# Patient Record
Sex: Female | Born: 1945
Health system: Southern US, Community
[De-identification: ages and names within clinical notes are randomized; demographics above are authoritative.]

## PROBLEM LIST (undated history)

## (undated) DIAGNOSIS — M1990 Unspecified osteoarthritis, unspecified site: Secondary | ICD-10-CM

## (undated) DIAGNOSIS — M5430 Sciatica, unspecified side: Secondary | ICD-10-CM

## (undated) DIAGNOSIS — I10 Essential (primary) hypertension: Secondary | ICD-10-CM

## (undated) DIAGNOSIS — E119 Type 2 diabetes mellitus without complications: Secondary | ICD-10-CM

## (undated) DIAGNOSIS — Z9889 Other specified postprocedural states: Secondary | ICD-10-CM

## (undated) DIAGNOSIS — Z8619 Personal history of other infectious and parasitic diseases: Secondary | ICD-10-CM

## (undated) DIAGNOSIS — I4819 Other persistent atrial fibrillation: Secondary | ICD-10-CM

## (undated) DIAGNOSIS — Z6841 Body Mass Index (BMI) 40.0 and over, adult: Secondary | ICD-10-CM

## (undated) DIAGNOSIS — E785 Hyperlipidemia, unspecified: Secondary | ICD-10-CM

## (undated) DIAGNOSIS — I499 Cardiac arrhythmia, unspecified: Secondary | ICD-10-CM

## (undated) DIAGNOSIS — G4730 Sleep apnea, unspecified: Secondary | ICD-10-CM

## (undated) DIAGNOSIS — R112 Nausea with vomiting, unspecified: Secondary | ICD-10-CM

## (undated) HISTORY — DX: Hyperlipidemia, unspecified: E78.5

## (undated) HISTORY — PX: BREAST EXCISIONAL BIOPSY: SUR124

## (undated) HISTORY — DX: Body Mass Index (BMI) 40.0 and over, adult: Z684

## (undated) HISTORY — DX: Essential (primary) hypertension: I10

## (undated) HISTORY — PX: DG  BONE DENSITY (ARMC HX): HXRAD1102

## (undated) HISTORY — PX: ABDOMINAL HYSTERECTOMY: SHX81

## (undated) HISTORY — DX: Type 2 diabetes mellitus without complications: E11.9

## (undated) HISTORY — DX: Other persistent atrial fibrillation: I48.19

## (undated) HISTORY — PX: INNER EAR SURGERY: SHX679

## (undated) HISTORY — DX: Personal history of other infectious and parasitic diseases: Z86.19

## (undated) HISTORY — PX: NASAL SEPTUM SURGERY: SHX37

## (undated) HISTORY — DX: Sciatica, unspecified side: M54.30

---

## 2003-09-12 ENCOUNTER — Inpatient Hospital Stay (HOSPITAL_BASED_OUTPATIENT_CLINIC_OR_DEPARTMENT_OTHER): Admission: RE | Admit: 2003-09-12 | Discharge: 2003-09-12 | Payer: Self-pay | Admitting: Cardiology

## 2008-01-06 ENCOUNTER — Encounter: Admission: RE | Admit: 2008-01-06 | Discharge: 2008-01-06 | Payer: Self-pay | Admitting: Orthopedic Surgery

## 2008-02-07 ENCOUNTER — Ambulatory Visit (HOSPITAL_BASED_OUTPATIENT_CLINIC_OR_DEPARTMENT_OTHER): Admission: RE | Admit: 2008-02-07 | Discharge: 2008-02-07 | Payer: Self-pay | Admitting: Orthopedic Surgery

## 2010-08-11 NOTE — Op Note (Signed)
NAME:  Dominique Bauer, Dominique Bauer             ACCOUNT NO.:  [id number]   MEDICAL RECORD NO.:  [id number]          PATIENT TYPE:  AMB   LOCATION:  [facility]                         FACILITY:  [facility]   PHYSICIAN:  Ollen Gross, M.D.    DATE OF BIRTH:  October 14, 1945   DATE OF PROCEDURE:  02/07/2008  DATE OF DISCHARGE:                               OPERATIVE REPORT   PREOPERATIVE DIAGNOSIS:  Right knee medial meniscal tear.   POSTOPERATIVE DIAGNOSES:  Right knee medial meniscal tear plus lateral  meniscal tear, plus chondral defect, medial femoral condyle.   PROCEDURE:  Right knee arthroscopy with meniscal debridement medial and  lateral and chondroplasty, medial femoral condyle.   SURGEON:  Ollen Gross, M.D.   ASSISTANT:  None.   ANESTHESIA:  General.   ESTIMATED BLOOD LOSS:  Minimal.   DRAINS:  None.   COMPLICATIONS:  None.   CONDITION:  Stable to recovery.   BRIEF CLINICAL NOTE:  Dominique Bauer is a 65 year old female with a long  history of significantly worsening right knee pain and mechanical  symptoms.  She does have some patellofemoral arthritis, but her symptoms  were mostly medial.  Exam and history suggested a tear confirmed by MRI.  She presents now for arthroscopy and debridement.   PROCEDURE IN DETAIL:  After successful administration of general  anesthetic a tourniquet was placed high on the right thigh and right  lower extremity prepped and draped in usual sterile fashion.  Standard  superomedial and inferolateral incisions were made.  The inflow cannula  passed superomedial and camera passed inferolateral.  Arthroscopic  visualization proceeds.  She had some bone-on-bone change on the  undersurface of the patella and in the trochlea mainly lateral, but also  somewhat central.  She did not have any unstable appearing cartilage in  the trochlea or patella.  The medial and lateral gutters were  visualized.  There were no loose bodies.  Flexion valgus force was  applied to the knee  and the medial compartment was centered.  She had  some loose bodies in the medial compartment and about a 1 x 1 cm  chondral defect down to bone.  In addition, there was a significant tear  in the body and posterior horn of the medial meniscus.  No other  degenerative changes were noted in the medial compartment.  A spinal  needle was used to localize the inferomedial portal.  A small incision  made, dilator placed and we then we debrided the meniscus back to a  stable base with baskets and a 4.2 mm shaver and then sealed it off with  the ArthroCare device.  I debrided the chondral defect down to a stable  bony base with stable cartilaginous edges.  I probed and it was stable.  It is about a 1 x 1.5 cm lesion.  The rest of the medial femoral condyle  was stable and looked normal.  The intercondylar notch was then  visualized.  The ACL was normal.  Lateral compartment centered and there  was a significant degenerative tear in the body anterior and posterior  horn of the lateral meniscus.  We debrided this back to a stable base  with baskets, the 4.2 mm shaver and sealed it off with the ArthroCare  device.  The joint was again inspected.  There are no other tears, loose  bodies or chondral defects noted.  I removed the arthroscopic equipment  from the inferior portals which were closed with interrupted 4-0 nylon.  The 20 mL of 0.25% Marcaine with epi was injected through the inflow  cannula and that was removed and that portal closed with nylon.  A bulky  sterile dressing was then applied and she was awakened and transported  to recovery in stable condition.      Ollen Gross, M.D.  Electronically Signed     FA/MEDQ  D:  02/07/2008  T:  02/08/2008  Job:  [id number]

## 2010-08-14 NOTE — Cardiovascular Report (Signed)
NAME:  Dominique Bauer, Dominique Bauer                       ACCOUNT NO.:  [id number]   MEDICAL RECORD NO.:  [id number]                   PATIENT TYPE:  OIB   LOCATION:  6501                                 FACILITY:  [facility]   PHYSICIAN:  Charlton Haws, M.D.                  DATE OF BIRTH:  06-19-1945   DATE OF PROCEDURE:  09/12/2003  DATE OF DISCHARGE:  09/12/2003                              CARDIAC CATHETERIZATION   INDICATION:  Recurrent chest pain.   PROCEDURE:  Standard catheterization was done with 5 French catheters from  the right femoral artery.  JL-4 and JR-4 catheters were used to engage the  coronary ostia.   The left main coronary artery was normal.   The left anterior descending artery is normal.   Circumflex coronary artery is normal.   Right coronary artery was dominant and it was normal.   RAO VENTRICULOGRAPHY:  RAO ventriculography showed normal wall motion.  There was an EF of 65%.  There was no MR.  No gradient across the aortic  valve.   PRESSURES:  LV pressure is 145/80, aortic pressure was 145/80.   IMPRESSION:  The patient has no significant coronary artery disease.  Her  chest pain would appear to be noncardiac in etiology.  She will follow up  with primary care M.D.  She will be allowed to return to work at the [employer] at [place] on Monday June 20.  She tolerated the procedure well and had  no apparent complications.                                               Charlton Haws, M.D.    PN/MEDQ  D:  09/12/2003  T:  09/12/2003  Job:  [id number]   cc:   [facility]  [address]  [place]  [place] [address]  Fax: [phone]

## 2010-12-29 LAB — CBC
HCT: 39.6
Hemoglobin: 13.1
MCHC: 33
MCV: 89.8
Platelets: 219
RBC: 4.41
RDW: 14.1

## 2010-12-29 LAB — URINALYSIS, ROUTINE W REFLEX MICROSCOPIC
Glucose, UA: NEGATIVE
Ketones, ur: NEGATIVE

## 2010-12-29 LAB — PROTIME-INR: INR: 0.9

## 2010-12-29 LAB — URINE MICROSCOPIC-ADD ON

## 2010-12-29 LAB — BASIC METABOLIC PANEL
BUN: 19
CO2: 28
Calcium: 9.3
Creatinine, Ser: 0.68
GFR calc Af Amer: >60
GFR calc non Af Amer: >60
Glucose, Bld: 111 — ABNORMAL HIGH
Potassium: 4

## 2012-01-25 ENCOUNTER — Other Ambulatory Visit: Payer: Self-pay | Admitting: Internal Medicine

## 2012-01-25 DIAGNOSIS — R921 Mammographic calcification found on diagnostic imaging of breast: Secondary | ICD-10-CM

## 2012-02-01 ENCOUNTER — Ambulatory Visit
Admission: RE | Admit: 2012-02-01 | Discharge: 2012-02-01 | Disposition: A | Discharge status: Home / Self Care | Payer: Medicare Other | Source: Ambulatory Visit | Attending: Internal Medicine | Admitting: Internal Medicine

## 2012-02-01 DIAGNOSIS — R921 Mammographic calcification found on diagnostic imaging of breast: Secondary | ICD-10-CM

## 2012-02-01 HISTORY — PX: BREAST BIOPSY: SHX20

## 2013-01-11 ENCOUNTER — Other Ambulatory Visit: Payer: Self-pay

## 2013-01-11 DIAGNOSIS — Z1231 Encounter for screening mammogram for malignant neoplasm of breast: Secondary | ICD-10-CM

## 2013-02-09 ENCOUNTER — Ambulatory Visit
Admission: RE | Admit: 2013-02-09 | Discharge: 2013-02-09 | Disposition: A | Discharge status: Home / Self Care | Payer: Medicare Other | Source: Ambulatory Visit

## 2013-02-09 DIAGNOSIS — Z1231 Encounter for screening mammogram for malignant neoplasm of breast: Secondary | ICD-10-CM

## 2014-04-30 ENCOUNTER — Other Ambulatory Visit: Payer: Self-pay

## 2014-04-30 DIAGNOSIS — Z1231 Encounter for screening mammogram for malignant neoplasm of breast: Secondary | ICD-10-CM

## 2014-05-06 ENCOUNTER — Ambulatory Visit: Payer: Self-pay

## 2014-05-28 ENCOUNTER — Ambulatory Visit
Admission: RE | Admit: 2014-05-28 | Discharge: 2014-05-28 | Disposition: A | Discharge status: Home / Self Care | Payer: Medicare Other | Source: Ambulatory Visit

## 2014-05-28 DIAGNOSIS — Z1231 Encounter for screening mammogram for malignant neoplasm of breast: Secondary | ICD-10-CM

## 2015-03-31 DIAGNOSIS — M5430 Sciatica, unspecified side: Secondary | ICD-10-CM | POA: Diagnosis not present

## 2015-03-31 DIAGNOSIS — M9902 Segmental and somatic dysfunction of thoracic region: Secondary | ICD-10-CM | POA: Diagnosis not present

## 2015-03-31 DIAGNOSIS — I973 Postprocedural hypertension: Secondary | ICD-10-CM | POA: Diagnosis not present

## 2015-03-31 DIAGNOSIS — M9903 Segmental and somatic dysfunction of lumbar region: Secondary | ICD-10-CM | POA: Diagnosis not present

## 2015-03-31 DIAGNOSIS — M9904 Segmental and somatic dysfunction of sacral region: Secondary | ICD-10-CM | POA: Diagnosis not present

## 2015-04-03 DIAGNOSIS — M5430 Sciatica, unspecified side: Secondary | ICD-10-CM | POA: Diagnosis not present

## 2015-04-03 DIAGNOSIS — I973 Postprocedural hypertension: Secondary | ICD-10-CM | POA: Diagnosis not present

## 2015-04-03 DIAGNOSIS — M9903 Segmental and somatic dysfunction of lumbar region: Secondary | ICD-10-CM | POA: Diagnosis not present

## 2015-04-03 DIAGNOSIS — M9904 Segmental and somatic dysfunction of sacral region: Secondary | ICD-10-CM | POA: Diagnosis not present

## 2015-04-03 DIAGNOSIS — M9902 Segmental and somatic dysfunction of thoracic region: Secondary | ICD-10-CM | POA: Diagnosis not present

## 2015-04-07 DIAGNOSIS — I10 Essential (primary) hypertension: Secondary | ICD-10-CM | POA: Diagnosis not present

## 2015-04-07 DIAGNOSIS — J019 Acute sinusitis, unspecified: Secondary | ICD-10-CM | POA: Diagnosis not present

## 2015-04-21 DIAGNOSIS — M5430 Sciatica, unspecified side: Secondary | ICD-10-CM | POA: Diagnosis not present

## 2015-04-21 DIAGNOSIS — M9904 Segmental and somatic dysfunction of sacral region: Secondary | ICD-10-CM | POA: Diagnosis not present

## 2015-04-21 DIAGNOSIS — M9903 Segmental and somatic dysfunction of lumbar region: Secondary | ICD-10-CM | POA: Diagnosis not present

## 2015-04-21 DIAGNOSIS — M9902 Segmental and somatic dysfunction of thoracic region: Secondary | ICD-10-CM | POA: Diagnosis not present

## 2015-04-21 DIAGNOSIS — I973 Postprocedural hypertension: Secondary | ICD-10-CM | POA: Diagnosis not present

## 2015-04-23 DIAGNOSIS — M9904 Segmental and somatic dysfunction of sacral region: Secondary | ICD-10-CM | POA: Diagnosis not present

## 2015-04-23 DIAGNOSIS — M9903 Segmental and somatic dysfunction of lumbar region: Secondary | ICD-10-CM | POA: Diagnosis not present

## 2015-04-23 DIAGNOSIS — I973 Postprocedural hypertension: Secondary | ICD-10-CM | POA: Diagnosis not present

## 2015-04-23 DIAGNOSIS — M9902 Segmental and somatic dysfunction of thoracic region: Secondary | ICD-10-CM | POA: Diagnosis not present

## 2015-04-23 DIAGNOSIS — M5430 Sciatica, unspecified side: Secondary | ICD-10-CM | POA: Diagnosis not present

## 2015-04-24 DIAGNOSIS — M9902 Segmental and somatic dysfunction of thoracic region: Secondary | ICD-10-CM | POA: Diagnosis not present

## 2015-04-24 DIAGNOSIS — M5430 Sciatica, unspecified side: Secondary | ICD-10-CM | POA: Diagnosis not present

## 2015-04-24 DIAGNOSIS — M9903 Segmental and somatic dysfunction of lumbar region: Secondary | ICD-10-CM | POA: Diagnosis not present

## 2015-04-24 DIAGNOSIS — M9904 Segmental and somatic dysfunction of sacral region: Secondary | ICD-10-CM | POA: Diagnosis not present

## 2015-04-24 DIAGNOSIS — I973 Postprocedural hypertension: Secondary | ICD-10-CM | POA: Diagnosis not present

## 2015-04-28 DIAGNOSIS — M5430 Sciatica, unspecified side: Secondary | ICD-10-CM | POA: Diagnosis not present

## 2015-04-28 DIAGNOSIS — M9903 Segmental and somatic dysfunction of lumbar region: Secondary | ICD-10-CM | POA: Diagnosis not present

## 2015-04-28 DIAGNOSIS — I471 Supraventricular tachycardia: Secondary | ICD-10-CM | POA: Diagnosis not present

## 2015-04-28 DIAGNOSIS — M9902 Segmental and somatic dysfunction of thoracic region: Secondary | ICD-10-CM | POA: Diagnosis not present

## 2015-04-28 DIAGNOSIS — I10 Essential (primary) hypertension: Secondary | ICD-10-CM | POA: Diagnosis not present

## 2015-04-28 DIAGNOSIS — I973 Postprocedural hypertension: Secondary | ICD-10-CM | POA: Diagnosis not present

## 2015-04-28 DIAGNOSIS — Z1389 Encounter for screening for other disorder: Secondary | ICD-10-CM | POA: Diagnosis not present

## 2015-04-28 DIAGNOSIS — M9904 Segmental and somatic dysfunction of sacral region: Secondary | ICD-10-CM | POA: Diagnosis not present

## 2015-04-28 DIAGNOSIS — Z0000 Encounter for general adult medical examination without abnormal findings: Secondary | ICD-10-CM | POA: Diagnosis not present

## 2015-04-30 DIAGNOSIS — M9904 Segmental and somatic dysfunction of sacral region: Secondary | ICD-10-CM | POA: Diagnosis not present

## 2015-04-30 DIAGNOSIS — M9903 Segmental and somatic dysfunction of lumbar region: Secondary | ICD-10-CM | POA: Diagnosis not present

## 2015-04-30 DIAGNOSIS — M5430 Sciatica, unspecified side: Secondary | ICD-10-CM | POA: Diagnosis not present

## 2015-04-30 DIAGNOSIS — I973 Postprocedural hypertension: Secondary | ICD-10-CM | POA: Diagnosis not present

## 2015-04-30 DIAGNOSIS — M9902 Segmental and somatic dysfunction of thoracic region: Secondary | ICD-10-CM | POA: Diagnosis not present

## 2015-05-01 DIAGNOSIS — M549 Dorsalgia, unspecified: Secondary | ICD-10-CM | POA: Diagnosis not present

## 2015-05-01 DIAGNOSIS — Z780 Asymptomatic menopausal state: Secondary | ICD-10-CM | POA: Diagnosis not present

## 2015-05-01 DIAGNOSIS — M810 Age-related osteoporosis without current pathological fracture: Secondary | ICD-10-CM | POA: Diagnosis not present

## 2015-05-01 DIAGNOSIS — M8589 Other specified disorders of bone density and structure, multiple sites: Secondary | ICD-10-CM | POA: Diagnosis not present

## 2015-05-01 DIAGNOSIS — M2550 Pain in unspecified joint: Secondary | ICD-10-CM | POA: Diagnosis not present

## 2015-05-01 DIAGNOSIS — Z7982 Long term (current) use of aspirin: Secondary | ICD-10-CM | POA: Diagnosis not present

## 2015-05-01 DIAGNOSIS — M85852 Other specified disorders of bone density and structure, left thigh: Secondary | ICD-10-CM | POA: Diagnosis not present

## 2015-05-01 DIAGNOSIS — Z79899 Other long term (current) drug therapy: Secondary | ICD-10-CM | POA: Diagnosis not present

## 2015-05-01 DIAGNOSIS — I10 Essential (primary) hypertension: Secondary | ICD-10-CM | POA: Diagnosis not present

## 2015-05-01 DIAGNOSIS — Z8262 Family history of osteoporosis: Secondary | ICD-10-CM | POA: Diagnosis not present

## 2015-05-05 DIAGNOSIS — M9904 Segmental and somatic dysfunction of sacral region: Secondary | ICD-10-CM | POA: Diagnosis not present

## 2015-05-05 DIAGNOSIS — M9903 Segmental and somatic dysfunction of lumbar region: Secondary | ICD-10-CM | POA: Diagnosis not present

## 2015-05-05 DIAGNOSIS — M9902 Segmental and somatic dysfunction of thoracic region: Secondary | ICD-10-CM | POA: Diagnosis not present

## 2015-05-05 DIAGNOSIS — M5430 Sciatica, unspecified side: Secondary | ICD-10-CM | POA: Diagnosis not present

## 2015-05-05 DIAGNOSIS — I973 Postprocedural hypertension: Secondary | ICD-10-CM | POA: Diagnosis not present

## 2015-05-07 DIAGNOSIS — I973 Postprocedural hypertension: Secondary | ICD-10-CM | POA: Diagnosis not present

## 2015-05-07 DIAGNOSIS — M9903 Segmental and somatic dysfunction of lumbar region: Secondary | ICD-10-CM | POA: Diagnosis not present

## 2015-05-07 DIAGNOSIS — M5430 Sciatica, unspecified side: Secondary | ICD-10-CM | POA: Diagnosis not present

## 2015-05-07 DIAGNOSIS — M9904 Segmental and somatic dysfunction of sacral region: Secondary | ICD-10-CM | POA: Diagnosis not present

## 2015-05-07 DIAGNOSIS — M9902 Segmental and somatic dysfunction of thoracic region: Secondary | ICD-10-CM | POA: Diagnosis not present

## 2015-05-12 DIAGNOSIS — M5430 Sciatica, unspecified side: Secondary | ICD-10-CM | POA: Diagnosis not present

## 2015-05-12 DIAGNOSIS — M9902 Segmental and somatic dysfunction of thoracic region: Secondary | ICD-10-CM | POA: Diagnosis not present

## 2015-05-12 DIAGNOSIS — M9904 Segmental and somatic dysfunction of sacral region: Secondary | ICD-10-CM | POA: Diagnosis not present

## 2015-05-12 DIAGNOSIS — M9903 Segmental and somatic dysfunction of lumbar region: Secondary | ICD-10-CM | POA: Diagnosis not present

## 2015-05-12 DIAGNOSIS — I973 Postprocedural hypertension: Secondary | ICD-10-CM | POA: Diagnosis not present

## 2015-05-15 DIAGNOSIS — M5430 Sciatica, unspecified side: Secondary | ICD-10-CM | POA: Diagnosis not present

## 2015-05-15 DIAGNOSIS — I973 Postprocedural hypertension: Secondary | ICD-10-CM | POA: Diagnosis not present

## 2015-05-15 DIAGNOSIS — M9903 Segmental and somatic dysfunction of lumbar region: Secondary | ICD-10-CM | POA: Diagnosis not present

## 2015-05-15 DIAGNOSIS — M9904 Segmental and somatic dysfunction of sacral region: Secondary | ICD-10-CM | POA: Diagnosis not present

## 2015-05-15 DIAGNOSIS — M9902 Segmental and somatic dysfunction of thoracic region: Secondary | ICD-10-CM | POA: Diagnosis not present

## 2015-05-21 DIAGNOSIS — M9903 Segmental and somatic dysfunction of lumbar region: Secondary | ICD-10-CM | POA: Diagnosis not present

## 2015-05-21 DIAGNOSIS — M5430 Sciatica, unspecified side: Secondary | ICD-10-CM | POA: Diagnosis not present

## 2015-05-21 DIAGNOSIS — M9904 Segmental and somatic dysfunction of sacral region: Secondary | ICD-10-CM | POA: Diagnosis not present

## 2015-05-21 DIAGNOSIS — M9902 Segmental and somatic dysfunction of thoracic region: Secondary | ICD-10-CM | POA: Diagnosis not present

## 2015-05-21 DIAGNOSIS — I973 Postprocedural hypertension: Secondary | ICD-10-CM | POA: Diagnosis not present

## 2015-05-26 DIAGNOSIS — M5430 Sciatica, unspecified side: Secondary | ICD-10-CM | POA: Diagnosis not present

## 2015-05-26 DIAGNOSIS — I973 Postprocedural hypertension: Secondary | ICD-10-CM | POA: Diagnosis not present

## 2015-05-26 DIAGNOSIS — M9904 Segmental and somatic dysfunction of sacral region: Secondary | ICD-10-CM | POA: Diagnosis not present

## 2015-05-26 DIAGNOSIS — M9902 Segmental and somatic dysfunction of thoracic region: Secondary | ICD-10-CM | POA: Diagnosis not present

## 2015-05-26 DIAGNOSIS — M9903 Segmental and somatic dysfunction of lumbar region: Secondary | ICD-10-CM | POA: Diagnosis not present

## 2015-06-04 DIAGNOSIS — M9902 Segmental and somatic dysfunction of thoracic region: Secondary | ICD-10-CM | POA: Diagnosis not present

## 2015-06-04 DIAGNOSIS — M9904 Segmental and somatic dysfunction of sacral region: Secondary | ICD-10-CM | POA: Diagnosis not present

## 2015-06-04 DIAGNOSIS — M5430 Sciatica, unspecified side: Secondary | ICD-10-CM | POA: Diagnosis not present

## 2015-06-04 DIAGNOSIS — I973 Postprocedural hypertension: Secondary | ICD-10-CM | POA: Diagnosis not present

## 2015-06-04 DIAGNOSIS — M9903 Segmental and somatic dysfunction of lumbar region: Secondary | ICD-10-CM | POA: Diagnosis not present

## 2015-06-11 DIAGNOSIS — M9903 Segmental and somatic dysfunction of lumbar region: Secondary | ICD-10-CM | POA: Diagnosis not present

## 2015-06-11 DIAGNOSIS — I973 Postprocedural hypertension: Secondary | ICD-10-CM | POA: Diagnosis not present

## 2015-06-11 DIAGNOSIS — M5430 Sciatica, unspecified side: Secondary | ICD-10-CM | POA: Diagnosis not present

## 2015-06-11 DIAGNOSIS — M9902 Segmental and somatic dysfunction of thoracic region: Secondary | ICD-10-CM | POA: Diagnosis not present

## 2015-06-11 DIAGNOSIS — M9904 Segmental and somatic dysfunction of sacral region: Secondary | ICD-10-CM | POA: Diagnosis not present

## 2015-06-12 ENCOUNTER — Ambulatory Visit (INDEPENDENT_AMBULATORY_CARE_PROVIDER_SITE_OTHER): Payer: Medicare Other | Admitting: Otolaryngology

## 2015-06-12 DIAGNOSIS — H6122 Impacted cerumen, left ear: Secondary | ICD-10-CM

## 2015-06-12 DIAGNOSIS — H9012 Conductive hearing loss, unilateral, left ear, with unrestricted hearing on the contralateral side: Secondary | ICD-10-CM

## 2015-06-12 DIAGNOSIS — H7202 Central perforation of tympanic membrane, left ear: Secondary | ICD-10-CM | POA: Diagnosis not present

## 2015-06-18 DIAGNOSIS — M9901 Segmental and somatic dysfunction of cervical region: Secondary | ICD-10-CM | POA: Diagnosis not present

## 2015-06-18 DIAGNOSIS — M9903 Segmental and somatic dysfunction of lumbar region: Secondary | ICD-10-CM | POA: Diagnosis not present

## 2015-06-18 DIAGNOSIS — M5030 Other cervical disc degeneration, unspecified cervical region: Secondary | ICD-10-CM | POA: Diagnosis not present

## 2015-06-18 DIAGNOSIS — M9902 Segmental and somatic dysfunction of thoracic region: Secondary | ICD-10-CM | POA: Diagnosis not present

## 2015-06-18 DIAGNOSIS — M9904 Segmental and somatic dysfunction of sacral region: Secondary | ICD-10-CM | POA: Diagnosis not present

## 2015-06-20 ENCOUNTER — Other Ambulatory Visit: Payer: Self-pay

## 2015-06-20 DIAGNOSIS — Z1231 Encounter for screening mammogram for malignant neoplasm of breast: Secondary | ICD-10-CM

## 2015-06-25 DIAGNOSIS — M5030 Other cervical disc degeneration, unspecified cervical region: Secondary | ICD-10-CM | POA: Diagnosis not present

## 2015-06-25 DIAGNOSIS — M9903 Segmental and somatic dysfunction of lumbar region: Secondary | ICD-10-CM | POA: Diagnosis not present

## 2015-06-25 DIAGNOSIS — M9902 Segmental and somatic dysfunction of thoracic region: Secondary | ICD-10-CM | POA: Diagnosis not present

## 2015-06-25 DIAGNOSIS — M9904 Segmental and somatic dysfunction of sacral region: Secondary | ICD-10-CM | POA: Diagnosis not present

## 2015-06-25 DIAGNOSIS — M9901 Segmental and somatic dysfunction of cervical region: Secondary | ICD-10-CM | POA: Diagnosis not present

## 2015-07-07 ENCOUNTER — Ambulatory Visit
Admission: RE | Admit: 2015-07-07 | Discharge: 2015-07-07 | Disposition: A | Discharge status: Home / Self Care | Payer: Medicare Other | Source: Ambulatory Visit

## 2015-07-07 DIAGNOSIS — Z1231 Encounter for screening mammogram for malignant neoplasm of breast: Secondary | ICD-10-CM

## 2015-07-09 DIAGNOSIS — M9904 Segmental and somatic dysfunction of sacral region: Secondary | ICD-10-CM | POA: Diagnosis not present

## 2015-07-09 DIAGNOSIS — M5030 Other cervical disc degeneration, unspecified cervical region: Secondary | ICD-10-CM | POA: Diagnosis not present

## 2015-07-09 DIAGNOSIS — M9901 Segmental and somatic dysfunction of cervical region: Secondary | ICD-10-CM | POA: Diagnosis not present

## 2015-07-09 DIAGNOSIS — M9902 Segmental and somatic dysfunction of thoracic region: Secondary | ICD-10-CM | POA: Diagnosis not present

## 2015-07-09 DIAGNOSIS — M9903 Segmental and somatic dysfunction of lumbar region: Secondary | ICD-10-CM | POA: Diagnosis not present

## 2015-07-23 DIAGNOSIS — M9903 Segmental and somatic dysfunction of lumbar region: Secondary | ICD-10-CM | POA: Diagnosis not present

## 2015-07-23 DIAGNOSIS — M9902 Segmental and somatic dysfunction of thoracic region: Secondary | ICD-10-CM | POA: Diagnosis not present

## 2015-07-23 DIAGNOSIS — M9901 Segmental and somatic dysfunction of cervical region: Secondary | ICD-10-CM | POA: Diagnosis not present

## 2015-07-23 DIAGNOSIS — M5030 Other cervical disc degeneration, unspecified cervical region: Secondary | ICD-10-CM | POA: Diagnosis not present

## 2015-07-23 DIAGNOSIS — M9904 Segmental and somatic dysfunction of sacral region: Secondary | ICD-10-CM | POA: Diagnosis not present

## 2015-07-25 DIAGNOSIS — Z0000 Encounter for general adult medical examination without abnormal findings: Secondary | ICD-10-CM | POA: Diagnosis not present

## 2015-07-25 DIAGNOSIS — I10 Essential (primary) hypertension: Secondary | ICD-10-CM | POA: Diagnosis not present

## 2015-08-06 DIAGNOSIS — M9902 Segmental and somatic dysfunction of thoracic region: Secondary | ICD-10-CM | POA: Diagnosis not present

## 2015-08-06 DIAGNOSIS — I973 Postprocedural hypertension: Secondary | ICD-10-CM | POA: Diagnosis not present

## 2015-08-06 DIAGNOSIS — M5430 Sciatica, unspecified side: Secondary | ICD-10-CM | POA: Diagnosis not present

## 2015-08-06 DIAGNOSIS — M9904 Segmental and somatic dysfunction of sacral region: Secondary | ICD-10-CM | POA: Diagnosis not present

## 2015-08-06 DIAGNOSIS — M9903 Segmental and somatic dysfunction of lumbar region: Secondary | ICD-10-CM | POA: Diagnosis not present

## 2015-08-08 DIAGNOSIS — H524 Presbyopia: Secondary | ICD-10-CM | POA: Diagnosis not present

## 2015-08-08 DIAGNOSIS — H35363 Drusen (degenerative) of macula, bilateral: Secondary | ICD-10-CM | POA: Diagnosis not present

## 2015-08-08 DIAGNOSIS — H52223 Regular astigmatism, bilateral: Secondary | ICD-10-CM | POA: Diagnosis not present

## 2015-08-08 DIAGNOSIS — H5203 Hypermetropia, bilateral: Secondary | ICD-10-CM | POA: Diagnosis not present

## 2015-08-20 DIAGNOSIS — M9904 Segmental and somatic dysfunction of sacral region: Secondary | ICD-10-CM | POA: Diagnosis not present

## 2015-08-20 DIAGNOSIS — I973 Postprocedural hypertension: Secondary | ICD-10-CM | POA: Diagnosis not present

## 2015-08-20 DIAGNOSIS — M9902 Segmental and somatic dysfunction of thoracic region: Secondary | ICD-10-CM | POA: Diagnosis not present

## 2015-08-20 DIAGNOSIS — M5430 Sciatica, unspecified side: Secondary | ICD-10-CM | POA: Diagnosis not present

## 2015-08-20 DIAGNOSIS — M9903 Segmental and somatic dysfunction of lumbar region: Secondary | ICD-10-CM | POA: Diagnosis not present

## 2015-09-03 DIAGNOSIS — M9903 Segmental and somatic dysfunction of lumbar region: Secondary | ICD-10-CM | POA: Diagnosis not present

## 2015-09-03 DIAGNOSIS — M9904 Segmental and somatic dysfunction of sacral region: Secondary | ICD-10-CM | POA: Diagnosis not present

## 2015-09-03 DIAGNOSIS — I973 Postprocedural hypertension: Secondary | ICD-10-CM | POA: Diagnosis not present

## 2015-09-03 DIAGNOSIS — M9902 Segmental and somatic dysfunction of thoracic region: Secondary | ICD-10-CM | POA: Diagnosis not present

## 2015-09-03 DIAGNOSIS — M5430 Sciatica, unspecified side: Secondary | ICD-10-CM | POA: Diagnosis not present

## 2015-09-08 DIAGNOSIS — M9903 Segmental and somatic dysfunction of lumbar region: Secondary | ICD-10-CM | POA: Diagnosis not present

## 2015-09-08 DIAGNOSIS — M5430 Sciatica, unspecified side: Secondary | ICD-10-CM | POA: Diagnosis not present

## 2015-09-08 DIAGNOSIS — M9904 Segmental and somatic dysfunction of sacral region: Secondary | ICD-10-CM | POA: Diagnosis not present

## 2015-09-08 DIAGNOSIS — M9902 Segmental and somatic dysfunction of thoracic region: Secondary | ICD-10-CM | POA: Diagnosis not present

## 2015-09-08 DIAGNOSIS — I973 Postprocedural hypertension: Secondary | ICD-10-CM | POA: Diagnosis not present

## 2015-09-22 DIAGNOSIS — I973 Postprocedural hypertension: Secondary | ICD-10-CM | POA: Diagnosis not present

## 2015-09-22 DIAGNOSIS — M5430 Sciatica, unspecified side: Secondary | ICD-10-CM | POA: Diagnosis not present

## 2015-09-22 DIAGNOSIS — M9903 Segmental and somatic dysfunction of lumbar region: Secondary | ICD-10-CM | POA: Diagnosis not present

## 2015-09-22 DIAGNOSIS — M9902 Segmental and somatic dysfunction of thoracic region: Secondary | ICD-10-CM | POA: Diagnosis not present

## 2015-09-22 DIAGNOSIS — M9904 Segmental and somatic dysfunction of sacral region: Secondary | ICD-10-CM | POA: Diagnosis not present

## 2015-10-01 DIAGNOSIS — I480 Paroxysmal atrial fibrillation: Secondary | ICD-10-CM | POA: Diagnosis not present

## 2015-10-01 DIAGNOSIS — I10 Essential (primary) hypertension: Secondary | ICD-10-CM | POA: Diagnosis not present

## 2015-10-16 ENCOUNTER — Ambulatory Visit (INDEPENDENT_AMBULATORY_CARE_PROVIDER_SITE_OTHER): Payer: Medicare Other | Admitting: Otolaryngology

## 2015-10-16 DIAGNOSIS — H6123 Impacted cerumen, bilateral: Secondary | ICD-10-CM | POA: Diagnosis not present

## 2015-10-16 DIAGNOSIS — H9012 Conductive hearing loss, unilateral, left ear, with unrestricted hearing on the contralateral side: Secondary | ICD-10-CM | POA: Diagnosis not present

## 2015-10-16 DIAGNOSIS — H7202 Central perforation of tympanic membrane, left ear: Secondary | ICD-10-CM

## 2015-10-29 DIAGNOSIS — I480 Paroxysmal atrial fibrillation: Secondary | ICD-10-CM | POA: Diagnosis not present

## 2015-10-29 DIAGNOSIS — I10 Essential (primary) hypertension: Secondary | ICD-10-CM | POA: Diagnosis not present

## 2015-11-03 NOTE — Progress Notes (Signed)
Cardiology Office Note   Date:  11/04/2015   ID:  Dominique Bauer, DOB January 16, 1946, MRN [id number]  PCP:  [name], MD  Cardiologist:   Jenkins Rouge, MD   No chief complaint on file.     History of Present Illness: Dominique Bauer is a 70 y.o. female who presents for evaluation of afib. She is schedule for [facility] on 11/17/15 She is married to one of my other patients. During Our visit last week she indicated being Rx by Dr Sherrie Sport for afib. Has had diaphoresis and palpitations for about 3 months. Echo done 01/23/15 LA 42 mm EF 55-60% Mild to moderate LVH  CRF;s HTN.  Started on beta blocker and Eliquis 7/8  Mild exertional dyspnea no chest pain or syncope  Long discussion about rate control, anticoagulation and conversion Favor at least one attempt at [facility] especially with only mild LAE Risks including stoke discussed Willing to proceed    Past Medical History:  Diagnosis Date  . Body mass index 40.0-44.9, adult ([place])   . H/O measles   . H/O mumps   . H/O: whooping cough   . History of chicken pox   . Hypertension   . Irregular heart rate   . Palpitations   . Paroxysmal atrial fibrillation (HCC)   . Sciatica   . SOB (shortness of breath)     Past Surgical History:  Procedure Laterality Date  . DG  BONE DENSITY ([facility] HX)       Current Outpatient Prescriptions  Medication Sig Dispense Refill  . acetaminophen (TYLENOL) 650 MG CR tablet Take 650 mg by mouth every 8 (eight) hours as needed for pain (arthritis).    Marland Kitchen albuterol (PROVENTIL HFA;VENTOLIN HFA) 108 (90 Base) MCG/ACT inhaler Inhale 2 puffs into the lungs every 6 (six) hours as needed for wheezing or shortness of breath.    Marland Kitchen apixaban (ELIQUIS) 5 MG TABS tablet Take 5 mg by mouth 2 (two) times daily.    . benazepril (LOTENSIN) 40 MG tablet Take 40 mg by mouth daily.    . calcium carbonate (CALCIUM 600) 1500 (600 Ca) MG TABS tablet Take 1,500 mg by mouth daily with breakfast.    . hydrochlorothiazide  (HYDRODIURIL) 25 MG tablet Take 25 mg by mouth daily as needed.    . L-LYSINE PO TAKE ONE TABLET BY MOUTH DAILY    . metoprolol tartrate (LOPRESSOR) 25 MG tablet TAKE 1 TABLET  IN THE MORNING AND 1 TABLET IN THE EVENING    . Multiple Vitamins-Minerals (ZINC PO) Take 1 tablet by mouth daily.    . multivitamin-lutein (OCUVITE-LUTEIN) CAPS capsule Take 1 capsule by mouth daily.    . Omega-3 Fatty Acids (FISH OIL) 1000 MG CAPS Take 1,000 mg by mouth daily.    . potassium chloride (K-DUR,KLOR-CON) 10 MEQ tablet Take 10 mEq by mouth daily.     No current facility-administered medications for this visit.     Allergies:   Neosporin [neomycin-bacitracin zn-polymyx] and Penicillins    Social History:  Married Does not work Sedentary Denies excess ETOH Non smoker  Family History:  Mother with HTN    ROS:  Please see the history of present illness.   Otherwise, review of systems are positive for none.   All other systems are reviewed and negative.    PHYSICAL EXAM: VS:  BP (!) 149/95   Pulse 89   Ht 5\' 8"  (1.727 m)   Wt 265 lb 1.9 oz (120.3 kg)  BMI 40.31 kg/m  , BMI Body mass index is 40.31 kg/m. Affect appropriate Healthy:  appears stated age 47: normal Neck supple with no adenopathy JVP normal no bruits no thyromegaly Lungs clear with no wheezing and good diaphragmatic motion Heart:  S1/S2 no murmur, no rub, gallop or click PMI normal Abdomen: benighn, BS positve, no tenderness, no AAA no bruit.  No HSM or HJR Distal pulses intact with no bruits No edema Neuro non-focal Skin warm and dry No muscular weakness    EKG:   Afib rate 86 LAD low voltage 10/31/15 11/04/15  afib rate 89 otherwise normal   Recent Labs: No results found for requested labs within last 8760 hours.    Lipid Panel No results found for: CHOL, TRIG, HDL, CHOLHDL, VLDL, LDLCALC, LDLDIRECT    Wt Readings from Last 3 Encounters:  11/04/15 265 lb 1.9 oz (120.3 kg)      Other studies  Reviewed: Additional studies/ records that were reviewed today include: Dr Sherrie Sport office Notes Echo .    ASSESSMENT AND PLAN:  1.  Afib: on Eliquis since 10/04/15  Scheduled [place] for 8/21 will get labs in  [place] office the Friday before.  Echo reassuring 2. HTN:  Well controlled.  Continue current medications and low sodium Dash type diet.    3. Asthma:  Possible OSA f/u primary consider sleep study to decrease recurrence risk of PAF 4. Obesity:  Discussed low carb diet    Current medicines are reviewed at length with the patient today.  The patient does not have concerns regarding medicines.  The following changes have been made:  no change  Labs/ tests ordered today include: CBC BMET pre [facility]  No orders of the defined types were placed in this encounter.    Disposition:   FU with me post [facility]      Signed, Jenkins Rouge, MD  11/04/2015 10:26 AM    [facility] [place], [place], [place]  [address] Phone: [phone]; Fax: [phone]

## 2015-11-04 ENCOUNTER — Encounter: Payer: Self-pay | Admitting: Cardiovascular Disease

## 2015-11-04 ENCOUNTER — Ambulatory Visit (INDEPENDENT_AMBULATORY_CARE_PROVIDER_SITE_OTHER): Payer: Medicare Other | Admitting: Cardiovascular Disease

## 2015-11-04 VITALS — BP 149/95 | HR 89 | Ht 68.0 in | Wt 265.1 lb

## 2015-11-04 DIAGNOSIS — I158 Other secondary hypertension: Secondary | ICD-10-CM

## 2015-11-04 DIAGNOSIS — I10 Essential (primary) hypertension: Secondary | ICD-10-CM | POA: Insufficient documentation

## 2015-11-04 DIAGNOSIS — Z01812 Encounter for preprocedural laboratory examination: Secondary | ICD-10-CM

## 2015-11-04 DIAGNOSIS — I499 Cardiac arrhythmia, unspecified: Secondary | ICD-10-CM

## 2015-11-04 DIAGNOSIS — I4891 Unspecified atrial fibrillation: Secondary | ICD-10-CM | POA: Diagnosis not present

## 2015-11-04 DIAGNOSIS — I4819 Other persistent atrial fibrillation: Secondary | ICD-10-CM | POA: Insufficient documentation

## 2015-11-04 DIAGNOSIS — I480 Paroxysmal atrial fibrillation: Secondary | ICD-10-CM

## 2015-11-04 DIAGNOSIS — R002 Palpitations: Secondary | ICD-10-CM | POA: Insufficient documentation

## 2015-11-04 DIAGNOSIS — R0602 Shortness of breath: Secondary | ICD-10-CM | POA: Insufficient documentation

## 2015-11-04 NOTE — Patient Instructions (Addendum)
Medication Instructions:  Your physician recommends that you continue on your current medications as directed. Please refer to the Current Medication list given to you today.  Lab work: Your physician recommends that you return for lab work next week for lab work at the hospital in [place].  BMET, CBC, and PT/INR  Testing/Procedures: Your physician has requested that you have a cardioversion for Atrial Fibrillation.   Follow-Up: Your physician wants you to follow-up in: September with Dr. Johnsie Cancel.    If you need a refill on your cardiac medications before your next appointment, please call your pharmacy.

## 2015-11-05 ENCOUNTER — Telehealth: Payer: Self-pay | Admitting: Cardiovascular Disease

## 2015-11-05 NOTE — Telephone Encounter (Signed)
Scheduled patient's follow-up for after cardioversion with Dr. Johnsie Cancel. There are no available openings in September, so made an appointment with Nell Range PA. Patient verbalized understanding.

## 2015-11-05 NOTE — Telephone Encounter (Signed)
New message   Pt verbalized that she is want to speak to rn about her follow up after surgery for September

## 2015-11-10 DIAGNOSIS — Z01812 Encounter for preprocedural laboratory examination: Secondary | ICD-10-CM | POA: Diagnosis not present

## 2015-11-10 DIAGNOSIS — I4891 Unspecified atrial fibrillation: Secondary | ICD-10-CM | POA: Diagnosis not present

## 2015-11-14 ENCOUNTER — Encounter: Payer: Self-pay | Admitting: Physician Assistant

## 2015-11-14 ENCOUNTER — Other Ambulatory Visit: Payer: Self-pay | Admitting: Cardiovascular Disease

## 2015-11-14 DIAGNOSIS — I480 Paroxysmal atrial fibrillation: Secondary | ICD-10-CM

## 2015-11-16 NOTE — Anesthesia Preprocedure Evaluation (Addendum)
Anesthesia Evaluation  Patient identified by MRN, date of birth, ID band Patient awake    Reviewed: Allergy & Precautions, NPO status , Patient's Chart, lab work & pertinent test results, reviewed documented beta blocker date and time   History of Anesthesia Complications Negative for: history of anesthetic complications  Airway Mallampati: III  TM Distance: >3 FB Neck ROM: Full    Dental  (+) Teeth Intact, Dental Advisory Given 2 bridges:   Pulmonary asthma (with illness) , sleep apnea ,    Pulmonary exam normal breath sounds clear to auscultation       Cardiovascular hypertension, Pt. on medications and Pt. on home beta blockers (-) angina+ Orthopnea  (-) Past MI, (-) Cardiac Stents and (-) PND + dysrhythmias Atrial Fibrillation  Rhythm:Irregular Rate:Normal     Neuro/Psych neg Seizures  Neuromuscular disease (sciatica)    GI/Hepatic negative GI ROS, Neg liver ROS, neg GERD  ,  Endo/Other  neg diabetesMorbid obesity  Renal/GU negative Renal ROS     Musculoskeletal   Abdominal (+) + obese,   Peds  Hematology   Anesthesia Other Findings   Reproductive/Obstetrics                            Anesthesia Physical Anesthesia Plan  ASA: III  Anesthesia Plan: MAC   Post-op Pain Management:    Induction: Intravenous  Airway Management Planned: Natural Airway and Nasal Cannula  Additional Equipment:   Intra-op Plan:   Post-operative Plan:   Informed Consent: I have reviewed the patients History and Physical, chart, labs and discussed the procedure including the risks, benefits and alternatives for the proposed anesthesia with the patient or authorized representative who has indicated his/her understanding and acceptance.   Dental advisory given  Plan Discussed with:   Anesthesia Plan Comments:        Anesthesia Quick Evaluation

## 2015-11-17 ENCOUNTER — Ambulatory Visit (HOSPITAL_COMMUNITY)
Admission: RE | Admit: 2015-11-17 | Discharge: 2015-11-17 | Disposition: A | Discharge status: Home / Self Care | Payer: Medicare Other | Source: Ambulatory Visit | Attending: Cardiovascular Disease | Admitting: Cardiovascular Disease

## 2015-11-17 ENCOUNTER — Encounter (HOSPITAL_COMMUNITY)
Admission: RE | Disposition: A | Discharge status: Home / Self Care | Payer: Self-pay | Source: Ambulatory Visit | Attending: Cardiovascular Disease

## 2015-11-17 ENCOUNTER — Other Ambulatory Visit: Payer: Self-pay | Admitting: Cardiovascular Disease

## 2015-11-17 ENCOUNTER — Ambulatory Visit (HOSPITAL_COMMUNITY): Payer: Medicare Other | Admitting: Anesthesiology

## 2015-11-17 ENCOUNTER — Encounter (HOSPITAL_COMMUNITY): Payer: Self-pay | Admitting: *Deleted

## 2015-11-17 DIAGNOSIS — Z7901 Long term (current) use of anticoagulants: Secondary | ICD-10-CM | POA: Insufficient documentation

## 2015-11-17 DIAGNOSIS — J45909 Unspecified asthma, uncomplicated: Secondary | ICD-10-CM | POA: Insufficient documentation

## 2015-11-17 DIAGNOSIS — I10 Essential (primary) hypertension: Secondary | ICD-10-CM | POA: Insufficient documentation

## 2015-11-17 DIAGNOSIS — I4891 Unspecified atrial fibrillation: Secondary | ICD-10-CM | POA: Diagnosis not present

## 2015-11-17 DIAGNOSIS — Z6841 Body Mass Index (BMI) 40.0 and over, adult: Secondary | ICD-10-CM | POA: Insufficient documentation

## 2015-11-17 DIAGNOSIS — I480 Paroxysmal atrial fibrillation: Secondary | ICD-10-CM | POA: Diagnosis not present

## 2015-11-17 DIAGNOSIS — Z79899 Other long term (current) drug therapy: Secondary | ICD-10-CM | POA: Diagnosis not present

## 2015-11-17 DIAGNOSIS — G4730 Sleep apnea, unspecified: Secondary | ICD-10-CM | POA: Diagnosis not present

## 2015-11-17 HISTORY — DX: Sleep apnea, unspecified: G47.30

## 2015-11-17 HISTORY — DX: Cardiac arrhythmia, unspecified: I49.9

## 2015-11-17 HISTORY — PX: CARDIOVERSION: SHX1299

## 2015-11-17 SURGERY — CARDIOVERSION
Anesthesia: Monitor Anesthesia Care

## 2015-11-17 MED ORDER — PROPOFOL 10 MG/ML IV BOLUS
INTRAVENOUS | Status: DC | PRN
  Administered 2015-11-17: 80 mg via INTRAVENOUS
  Administered 2015-11-17: 20 mg via INTRAVENOUS

## 2015-11-17 MED ORDER — SODIUM CHLORIDE 0.9 % IV SOLN
INTRAVENOUS | Status: DC
  Administered 2015-11-17: 08:00:00 via INTRAVENOUS

## 2015-11-17 MED ORDER — ONDANSETRON HCL 4 MG/2ML IJ SOLN
INTRAMUSCULAR | Status: DC | PRN
  Administered 2015-11-17: 4 mg via INTRAVENOUS

## 2015-11-17 MED ORDER — FLECAINIDE ACETATE 50 MG PO TABS: 50.0000 mg | ORAL_TABLET | Freq: Two times a day (BID) | ORAL | 3 refills | Status: DC

## 2015-11-17 MED ORDER — SODIUM CHLORIDE 0.9 % IV SOLN
INTRAVENOUS | Status: DC | PRN
  Administered 2015-11-17: 09:00:00 via INTRAVENOUS

## 2015-11-17 MED ORDER — SODIUM CHLORIDE 0.45 % IV SOLN: INTRAVENOUS | Status: DC

## 2015-11-17 NOTE — Discharge Instructions (Signed)
Electrical Cardioversion, Care After °Refer to this sheet in the next few weeks. These instructions provide you with information on caring for yourself after your procedure. Your health care provider may also give you more specific instructions. Your treatment has been planned according to current medical practices, but problems sometimes occur. Call your health care provider if you have any problems or questions after your procedure. °WHAT TO EXPECT AFTER THE PROCEDURE °After your procedure, it is typical to have the following sensations: °· Some redness on the skin where the shocks were delivered. If this is tender, a sunburn lotion or hydrocortisone cream may help. °· Possible return of an abnormal heart rhythm within hours or days after the procedure. °HOME CARE INSTRUCTIONS °· Take medicines only as directed by your health care provider. Be sure you understand how and when to take your medicine. °· Learn how to feel your pulse and check it often. °· Limit your activity for 48 hours after the procedure or as directed by your health care provider. °· Avoid or minimize caffeine and other stimulants as directed by your health care provider. °SEEK MEDICAL CARE IF: °· You feel like your heart is beating too fast or your pulse is not regular. °· You have any questions about your medicines. °· You have bleeding that will not stop. °SEEK IMMEDIATE MEDICAL CARE IF: °· You are dizzy or feel faint. °· It is hard to breathe or you feel short of breath. °· There is a change in discomfort in your chest. °· Your speech is slurred or you have trouble moving an arm or leg on one side of your body. °· You get a serious muscle cramp that does not go away. °· Your fingers or toes turn cold or blue. °  °This information is not intended to replace advice given to you by your health care provider. Make sure you discuss any questions you have with your health care provider. °  °Document Released: 01/03/2013 Document Revised: 04/05/2014  Document Reviewed: 01/03/2013 °Elsevier Interactive Patient Education ©2016 Elsevier Inc. ° °

## 2015-11-17 NOTE — H&P (View-Only) (Signed)
Cardiology Office Note   Date:  11/04/2015   ID:  Dominique Bauer, DOB [birth year], MRN [id number]  PCP:  [name], MD  Cardiologist:   Jenkins Rouge, MD   No chief complaint on file.     History of Present Illness: Dominique Bauer is a 70 y.o. female who presents for evaluation of afib. She is schedule for [facility] on 11/17/15 She is married to one of my other patients. During Our visit last week she indicated being Rx by Dr Sherrie Sport for afib. Has had diaphoresis and palpitations for about 3 months. Echo done 01/23/15 LA 42 mm EF 55-60% Mild to moderate LVH  CRF;s HTN.  Started on beta blocker and Eliquis 7/8  Mild exertional dyspnea no chest pain or syncope  Long discussion about rate control, anticoagulation and conversion Favor at least one attempt at [facility] especially with only mild LAE Risks including stoke discussed Willing to proceed    Past Medical History:  Diagnosis Date  . Body mass index 40.0-44.9, adult ([place])   . H/O measles   . H/O mumps   . H/O: whooping cough   . History of chicken pox   . Hypertension   . Irregular heart rate   . Palpitations   . Paroxysmal atrial fibrillation (HCC)   . Sciatica   . SOB (shortness of breath)     Past Surgical History:  Procedure Laterality Date  . DG  BONE DENSITY ([facility] HX)       Current Outpatient Prescriptions  Medication Sig Dispense Refill  . acetaminophen (TYLENOL) 650 MG CR tablet Take 650 mg by mouth every 8 (eight) hours as needed for pain (arthritis).    Marland Kitchen albuterol (PROVENTIL HFA;VENTOLIN HFA) 108 (90 Base) MCG/ACT inhaler Inhale 2 puffs into the lungs every 6 (six) hours as needed for wheezing or shortness of breath.    Marland Kitchen apixaban (ELIQUIS) 5 MG TABS tablet Take 5 mg by mouth 2 (two) times daily.    . benazepril (LOTENSIN) 40 MG tablet Take 40 mg by mouth daily.    . calcium carbonate (CALCIUM 600) 1500 (600 Ca) MG TABS tablet Take 1,500 mg by mouth daily with breakfast.    . hydrochlorothiazide  (HYDRODIURIL) 25 MG tablet Take 25 mg by mouth daily as needed.    . L-LYSINE PO TAKE ONE TABLET BY MOUTH DAILY    . metoprolol tartrate (LOPRESSOR) 25 MG tablet TAKE 1 TABLET  IN THE MORNING AND 1 TABLET IN THE EVENING    . Multiple Vitamins-Minerals (ZINC PO) Take 1 tablet by mouth daily.    . multivitamin-lutein (OCUVITE-LUTEIN) CAPS capsule Take 1 capsule by mouth daily.    . Omega-3 Fatty Acids (FISH OIL) 1000 MG CAPS Take 1,000 mg by mouth daily.    . potassium chloride (K-DUR,KLOR-CON) 10 MEQ tablet Take 10 mEq by mouth daily.     No current facility-administered medications for this visit.     Allergies:   Neosporin [neomycin-bacitracin zn-polymyx] and Penicillins    Social History:  Married Does not work Sedentary Denies excess ETOH Non smoker  Family History:  Mother with HTN    ROS:  Please see the history of present illness.   Otherwise, review of systems are positive for none.   All other systems are reviewed and negative.    PHYSICAL EXAM: VS:  BP (!) 149/95   Pulse 89   Ht 5\' 8"  (1.727 m)   Wt 265 lb 1.9 oz (120.3 kg)  BMI 40.31 kg/m  , BMI Body mass index is 40.31 kg/m. Affect appropriate Healthy:  appears stated age 65: normal Neck supple with no adenopathy JVP normal no bruits no thyromegaly Lungs clear with no wheezing and good diaphragmatic motion Heart:  S1/S2 no murmur, no rub, gallop or click PMI normal Abdomen: benighn, BS positve, no tenderness, no AAA no bruit.  No HSM or HJR Distal pulses intact with no bruits No edema Neuro non-focal Skin warm and dry No muscular weakness    EKG:   Afib rate 86 LAD low voltage 10/31/15 11/04/15  afib rate 89 otherwise normal   Recent Labs: No results found for requested labs within last 8760 hours.    Lipid Panel No results found for: CHOL, TRIG, HDL, CHOLHDL, VLDL, LDLCALC, LDLDIRECT    Wt Readings from Last 3 Encounters:  11/04/15 265 lb 1.9 oz (120.3 kg)      Other studies  Reviewed: Additional studies/ records that were reviewed today include: Dr Sherrie Sport office Notes Echo .    ASSESSMENT AND PLAN:  1.  Afib: on Eliquis since 10/04/15  Scheduled [place] for 8/21 will get labs in  [place] office the Friday before.  Echo reassuring 2. HTN:  Well controlled.  Continue current medications and low sodium Dash type diet.    3. Asthma:  Possible OSA f/u primary consider sleep study to decrease recurrence risk of PAF 4. Obesity:  Discussed low carb diet    Current medicines are reviewed at length with the patient today.  The patient does not have concerns regarding medicines.  The following changes have been made:  no change  Labs/ tests ordered today include: CBC BMET pre [place]  No orders of the defined types were placed in this encounter.    Disposition:   FU with me post [facility]      Signed, Jenkins Rouge, MD  11/04/2015 10:26 AM    [facility] [place], [place], [place]  [address] Phone: [phone]; Fax: [phone]

## 2015-11-17 NOTE — Anesthesia Postprocedure Evaluation (Signed)
Anesthesia Post Note  Patient: Dominique Bauer  Procedure(s) Performed: Procedure(s) (LRB): CARDIOVERSION (N/A)  Patient location during evaluation: Endoscopy Anesthesia Type: General Level of consciousness: sedated and patient cooperative Pain management: pain level controlled Vital Signs Assessment: post-procedure vital signs reviewed and stable Respiratory status: spontaneous breathing Cardiovascular status: stable Anesthetic complications: no    Last Vitals:  Vitals:   11/17/15 0930 11/17/15 0940  BP: 99/62 (!) 105/54  Pulse: 83   Resp: 20   Temp:      Last Pain:  Vitals:   11/17/15 0739  TempSrc: Oral  PainSc: [place]

## 2015-11-17 NOTE — Interval H&P Note (Signed)
History and Physical Interval Note:  11/17/2015 8:10 AM  Dominique Bauer  has presented today for surgery, with the diagnosis of AFIB  The various methods of treatment have been discussed with the patient and family. After consideration of risks, benefits and other options for treatment, the patient has consented to  Procedure(s): CARDIOVERSION (N/A) as a surgical intervention .  The patient's history has been reviewed, patient examined, no change in status, stable for surgery.  I have reviewed the patient's chart and labs.  Questions were answered to the patient's satisfaction.     Jenkins Rouge

## 2015-11-17 NOTE — CV Procedure (Signed)
DCC: Anesthesia:  Propofol 100 mg Dr Myrlene Broker  ON Rx Eliquis NPO had some zofran for nausea  DCC x 3 150 J then 200 J x2 No conversion  Plan unsuccessful [place] start flecainide ETT 2 weeks repeat [place] in 6-8 weeks  Jenkins Rouge

## 2015-11-17 NOTE — Transfer of Care (Signed)
Immediate Anesthesia Transfer of Care Note  Patient: Dominique Bauer  Procedure(s) Performed: Procedure(s): CARDIOVERSION (N/A)  Patient Location: Endoscopy Unit  Anesthesia Type:General  Level of Consciousness: awake, alert  and oriented  Airway & Oxygen Therapy: Patient Spontanous Breathing and Patient connected to nasal cannula oxygen  Post-op Assessment: Report given to RN, Post -op Vital signs reviewed and stable and Patient moving all extremities X 4  Post vital signs: Reviewed and stable  Last Vitals:  Vitals:   11/17/15 0739  BP: 126/66  Pulse: 92  Resp: 18  Temp: 37 C    Last Pain:  Vitals:   11/17/15 0739  TempSrc: Oral  PainSc: 1       Patients Stated Pain Goal: 2 ([id number])  Complications: No apparent anesthesia complications

## 2015-11-18 ENCOUNTER — Telehealth: Payer: Self-pay

## 2015-11-18 ENCOUNTER — Encounter (HOSPITAL_COMMUNITY): Payer: Self-pay | Admitting: Cardiovascular Disease

## 2015-11-18 DIAGNOSIS — I480 Paroxysmal atrial fibrillation: Secondary | ICD-10-CM

## 2015-11-18 DIAGNOSIS — I499 Cardiac arrhythmia, unspecified: Secondary | ICD-10-CM

## 2015-11-18 DIAGNOSIS — Z79899 Other long term (current) drug therapy: Secondary | ICD-10-CM

## 2015-11-18 NOTE — Telephone Encounter (Signed)
Called patient about scheduling ETT and an appointment with Dr. Johnsie Cancel. Patient stated that she will start Flecainide tomorrow, that her pharmacy will have it in. Went over instructions for GXT. Informed patient someone will be calling her to schedule. Patient will see Dr. Johnsie Cancel on 12/30/15 to discuss [facility] on flecainide. Patient verbalized understanding.

## 2015-11-18 NOTE — Telephone Encounter (Signed)
-----   Message from [name], MD sent at 11/17/2015  9:21 AM EDT ----- Failed [facility] I wrote order for flecainide 50 mg bid Needs ETT in 2 weeks to r/o pro arrhythmia and then f/u with me 5-6 weeks to arrange [facility] on flecainide

## 2015-11-28 ENCOUNTER — Ambulatory Visit: Payer: Medicare Other | Admitting: Physician Assistant

## 2015-11-28 DIAGNOSIS — I480 Paroxysmal atrial fibrillation: Secondary | ICD-10-CM | POA: Diagnosis not present

## 2015-11-28 DIAGNOSIS — I10 Essential (primary) hypertension: Secondary | ICD-10-CM | POA: Diagnosis not present

## 2015-12-04 ENCOUNTER — Ambulatory Visit (INDEPENDENT_AMBULATORY_CARE_PROVIDER_SITE_OTHER): Payer: Medicare Other

## 2015-12-04 DIAGNOSIS — Z79899 Other long term (current) drug therapy: Secondary | ICD-10-CM | POA: Diagnosis not present

## 2015-12-04 DIAGNOSIS — I480 Paroxysmal atrial fibrillation: Secondary | ICD-10-CM

## 2015-12-04 LAB — EXERCISE TOLERANCE TEST
CHL CUP MPHR: 150 {beats}/min
CHL CUP RESTING HR STRESS: 75 {beats}/min
Estimated workload: 4.6 METS
Exercise duration (min): 3 min
Exercise duration (sec): 0 s
Peak HR: 144 {beats}/min
Percent HR: 96 %
RPE: 17

## 2015-12-12 ENCOUNTER — Telehealth: Payer: Self-pay | Admitting: Cardiovascular Disease

## 2015-12-12 NOTE — Telephone Encounter (Signed)
Called Margret Chance with [facility]. Informed her patient was have lab work done for a cardioversion due to diagnosis of Atrial Fib. Margret Chance verbalized understanding.

## 2015-12-12 NOTE — Telephone Encounter (Signed)
New message     [facility]     Need diagnosis clarification -not passing medical necessity.

## 2015-12-25 NOTE — Progress Notes (Signed)
Cardiology Office Note   Date:  12/30/2015   ID:  Dominique Bauer, DOB 1945-04-26, MRN [id number]  PCP:  [name], MD  Cardiologist:   Jenkins Rouge, MD   Chief Complaint  Patient presents with  . Atrial Fibrillation      History of Present Illness: Dominique Bauer is a 70 y.o. female who presents for evaluation of afib. She is schedule for [facility] on 11/17/15 She is married to one of my other patients. During Our visit last week she indicated being Rx by Dr Sherrie Sport for afib. Has had diaphoresis and palpitations for about 3 months. Echo done 01/23/15 LA 42 mm EF 55-60% Mild to moderate LVH  CRF;s HTN.  Started on beta blocker and Eliquis 7/8  Mild exertional dyspnea no chest pain or syncope  Long discussion about rate control, anticoagulation and conversion Favor at least one attempt at [facility] especially with only mild LAE Risks including stoke discussed   [facility] attempted on 11/17/15 unscuccessful Started on flecainide  ETT on 12/04/15 no proarrhythmia  Plan is for repeat [facility] on AAT  Past Medical History:  Diagnosis Date  . Body mass index 40.0-44.9, adult ([place])   . Dysrhythmia   . H/O measles   . H/O mumps   . H/O: whooping cough   . History of chicken pox   . Hypertension   . Irregular heart rate   . Palpitations   . Paroxysmal atrial fibrillation (HCC)   . Sciatica   . Sleep apnea    not using aything at night right now/LH  . SOB (shortness of breath)     Past Surgical History:  Procedure Laterality Date  . ABDOMINAL HYSTERECTOMY    . BREAST BIOPSY    . CARDIOVERSION N/A 11/17/2015   Procedure: CARDIOVERSION;  Surgeon: [name], MD;  Location[facility] ENDOSCOPY;  Service: Cardiovascular;  Laterality: N/A;  . DG  BONE DENSITY ([place] HX)    . INNER EAR SURGERY    . NASAL SEPTUM SURGERY       Current Outpatient Prescriptions  Medication Sig Dispense Refill  . acetaminophen (TYLENOL) 650 MG CR tablet Take 650 mg by mouth every 8 (eight) hours as needed for  pain (arthritis).    Marland Kitchen albuterol (PROVENTIL HFA;VENTOLIN HFA) 108 (90 Base) MCG/ACT inhaler Inhale 2 puffs into the lungs every 6 (six) hours as needed for wheezing or shortness of breath.    Marland Kitchen apixaban (ELIQUIS) 5 MG TABS tablet Take 5 mg by mouth 2 (two) times daily.    . benazepril (LOTENSIN) 40 MG tablet Take 40 mg by mouth daily.    . calcium carbonate (CALCIUM 600) 1500 (600 Ca) MG TABS tablet Take 1,500 mg by mouth daily with breakfast.    . flecainide (TAMBOCOR) 50 MG tablet Take 1 tablet (50 mg total) by mouth 2 (two) times daily. 180 tablet 3  . hydrochlorothiazide (HYDRODIURIL) 25 MG tablet Take 25 mg by mouth daily as needed.    . L-LYSINE PO TAKE ONE TABLET BY MOUTH DAILY    . metoprolol tartrate (LOPRESSOR) 25 MG tablet TAKE 1 TABLET  IN THE MORNING AND 1 TABLET IN THE EVENING    . Multiple Vitamins-Minerals (ZINC PO) Take 1 tablet by mouth daily.    . multivitamin-lutein (OCUVITE-LUTEIN) CAPS capsule Take 1 capsule by mouth daily.    . Omega-3 Fatty Acids (FISH OIL) 1000 MG CAPS Take 1,000 mg by mouth daily.    . potassium chloride (K-DUR,KLOR-CON) 10  MEQ tablet Take 10 mEq by mouth daily as needed (W/HCTZ).      No current facility-administered medications for this visit.     Allergies:   Neosporin [neomycin-bacitracin zn-polymyx] and Penicillins    Social History:  Married Does not work Sedentary Denies excess ETOH Non smoker  Family History:  Mother with HTN    ROS:  Please see the history of present illness.   Otherwise, review of systems are positive for none.   All other systems are reviewed and negative.    PHYSICAL EXAM: VS:  BP 130/84   Pulse 75   Ht 5\' 8"  (1.727 m)   Wt 122.4 kg (269 lb 12.8 oz)   SpO2 96%   BMI 41.02 kg/m  , BMI Body mass index is 41.02 kg/m. Affect appropriate Healthy:  appears stated age 40: normal Neck supple with no adenopathy JVP normal no bruits no thyromegaly Lungs clear with no wheezing and good diaphragmatic  motion Heart:  S1/S2 no murmur, no rub, gallop or click PMI normal Abdomen: benighn, BS positve, no tenderness, no AAA no bruit.  No HSM or HJR Distal pulses intact with no bruits No edema Neuro non-focal Skin warm and dry No muscular weakness    EKG:   Afib rate 86 LAD low voltage 10/31/15 11/04/15  afib rate 89 otherwise normal 12/30/15 Afib rate 78 LAD no acute changes QRS 92     Recent Labs: No results found for requested labs within last 8760 hours.    Lipid Panel No results found for: CHOL, TRIG, HDL, CHOLHDL, VLDL, LDLCALC, LDLDIRECT    Wt Readings from Last 3 Encounters:  12/30/15 122.4 kg (269 lb 12.8 oz)  11/04/15 120.3 kg (265 lb 1.9 oz)      Other studies Reviewed: Additional studies/ records that were reviewed today include: Dr Sherrie Sport office Notes Echo .    ASSESSMENT AND PLAN:  1.  Afib: on Eliquis since 10/04/15  Scheduled [place] for 8/21 failed now on flecainide Will schedule repeat [facility] on AAT for 10/11  Labs on 10/9  2. HTN:  Well controlled.  Continue current medications and low sodium Dash type diet.    3. Asthma:  Possible OSA f/u primary consider sleep study to decrease recurrence risk of PAF 4. Obesity:  Discussed low carb diet    Current medicines are reviewed at length with the patient today.  The patient does not have concerns regarding medicines.  The following changes have been made:  no change  Labs/ tests ordered today include: CBC BMET pre [facility]  No orders of the defined types were placed in this encounter.    Disposition:   FU with me post [facility]      Signed, Jenkins Rouge, MD  12/30/2015 9:37 AM    [address], [place], [place]  [address] Phone: [phone]; Fax: [phone]

## 2015-12-29 ENCOUNTER — Telehealth: Payer: Self-pay | Admitting: *Deleted

## 2015-12-29 NOTE — Telephone Encounter (Signed)
called to ask pt to come at 800 am, LVM,

## 2015-12-30 ENCOUNTER — Ambulatory Visit (INDEPENDENT_AMBULATORY_CARE_PROVIDER_SITE_OTHER): Payer: Medicare Other | Admitting: Cardiovascular Disease

## 2015-12-30 ENCOUNTER — Encounter: Payer: Self-pay | Admitting: Cardiovascular Disease

## 2015-12-30 ENCOUNTER — Encounter (INDEPENDENT_AMBULATORY_CARE_PROVIDER_SITE_OTHER): Payer: Self-pay

## 2015-12-30 VITALS — BP 130/84 | HR 75 | Ht 68.0 in | Wt 269.8 lb

## 2015-12-30 DIAGNOSIS — I480 Paroxysmal atrial fibrillation: Secondary | ICD-10-CM

## 2015-12-30 DIAGNOSIS — Z01812 Encounter for preprocedural laboratory examination: Secondary | ICD-10-CM

## 2015-12-30 MED ORDER — FLECAINIDE ACETATE 50 MG PO TABS: 50.0000 mg | ORAL_TABLET | Freq: Two times a day (BID) | ORAL | 3 refills | Status: DC

## 2015-12-30 NOTE — Patient Instructions (Addendum)
Medication Instructions:  Your physician recommends that you continue on your current medications as directed. Please refer to the Current Medication list given to you today.  Labwork: Your physician recommends that you return for lab work on Monday 01/05/16 for BMET, CBC, and PT/INR  Testing/Procedures: Your physician has recommended that you have a Cardioversion (DCCV). Electrical Cardioversion uses a jolt of electricity to your heart either through paddles or wired patches attached to your chest. This is a controlled, usually prescheduled, procedure. Defibrillation is done under light anesthesia in the hospital, and you usually go home the day of the procedure. This is done to get your heart back into a normal rhythm. You are not awake for the procedure. Please see the instruction sheet given to you today.  Follow-Up: Your physician wants you to follow-up in: 6 months with Dr. Johnsie Cancel. You will receive a reminder letter in the mail two months in advance. If you don't receive a letter, please call our office to schedule the follow-up appointment.   If you need a refill on your cardiac medications before your next appointment, please call your pharmacy.

## 2016-01-01 DIAGNOSIS — I10 Essential (primary) hypertension: Secondary | ICD-10-CM | POA: Diagnosis not present

## 2016-01-01 DIAGNOSIS — I480 Paroxysmal atrial fibrillation: Secondary | ICD-10-CM | POA: Diagnosis not present

## 2016-01-02 ENCOUNTER — Telehealth: Payer: Self-pay | Admitting: Cardiovascular Disease

## 2016-01-02 DIAGNOSIS — G4733 Obstructive sleep apnea (adult) (pediatric): Secondary | ICD-10-CM

## 2016-01-02 NOTE — Telephone Encounter (Signed)
I called her she's fine now She also needs a sleep study and f/u with Dr Radford Pax for OSA If we can arrange

## 2016-01-02 NOTE — Telephone Encounter (Signed)
Patient was advised of above message, voiced understanding, and agreed with plan.

## 2016-01-02 NOTE — Telephone Encounter (Signed)
New Message:    Pt says she is scheduled for a Cardioversion next Wednesday.She says she would really like to talk to Dr Yetta Glassman have some questions. If he can not call,she would like to talk to a nurse.

## 2016-01-02 NOTE — Telephone Encounter (Signed)
**Note De-Identified Henna Derderian Obfuscation** The pt is aware that someone from this office will be calling her to arrange an OV with Dr Radford Pax and to arrange her sleep study.  I did complete the Epworth sleepiness scale questionnaire with the pt.  I have ordered the pts sleep study and gave Epworth sleepiness scale sheet to [facility].  I have sent a message to [facility] to call the pt and arrange an OV with Dr Radford Pax.

## 2016-01-02 NOTE — Telephone Encounter (Signed)
Information will be sent to the sleep lab today for scheduling.   Patient does not need to be scheduled with Dr. Radford Pax for sleep until we have the results from the sleep study.

## 2016-01-02 NOTE — Telephone Encounter (Signed)
I placed the order for the sleep study and assigned it to Dr Radford Pax to read. I did not set app w/ Dr Radford Pax. I will forward to Theda Sers Rn to follow and set app with Dr Radford Pax.

## 2016-01-02 NOTE — Telephone Encounter (Signed)
Dominique Bauer spoke with her GP Dr Sherrie Sport in [place]. She is now extremely nervous about her upcoming cardioversion on 01/07/16. Dr Sherrie Sport told her that her aorta is so enlarged that there could be clots and with the cardioversion it could send these clots to her brain and cause paralysis and they would have to put her in a nursing home.  Pt has been cardioverted before but now is very freighted. I offered reassurance explaining about treatment with eliquis since 10/04/15 and flecainide.  I told pt I would pass this information along to Dr Johnsie Cancel and his assistant. She would like to have a call back with further reassurance. She was told it may not be today but should hear back Monday.

## 2016-01-05 ENCOUNTER — Telehealth: Payer: Self-pay

## 2016-01-05 ENCOUNTER — Other Ambulatory Visit (INDEPENDENT_AMBULATORY_CARE_PROVIDER_SITE_OTHER): Payer: Medicare Other

## 2016-01-05 DIAGNOSIS — Z01812 Encounter for preprocedural laboratory examination: Secondary | ICD-10-CM | POA: Diagnosis not present

## 2016-01-05 DIAGNOSIS — I480 Paroxysmal atrial fibrillation: Secondary | ICD-10-CM | POA: Diagnosis not present

## 2016-01-05 LAB — CBC WITH DIFFERENTIAL/PLATELET
BASOS ABS: 0 {cells}/uL (ref 0–200)
BASOS PCT: 0 %
EOS ABS: 93 {cells}/uL (ref 15–500)
Eosinophils Relative: 1 %
HEMATOCRIT: 40.8 % (ref 35.0–45.0)
HEMOGLOBIN: 13.7 g/dL (ref 11.7–15.5)
LYMPHS ABS: 4371 {cells}/uL — AB (ref 850–3900)
Lymphocytes Relative: 47 %
MCH: 30.1 pg (ref 27.0–33.0)
MCHC: 33.6 g/dL (ref 32.0–36.0)
MCV: 89.7 fL (ref 80.0–100.0)
MONO ABS: 372 {cells}/uL (ref 200–950)
MPV: 10 fL (ref 7.5–12.5)
Monocytes Relative: 4 %
NEUTROS ABS: 4464 {cells}/uL (ref 1500–7800)
Neutrophils Relative %: 48 %
PLATELETS: 277 10*3/uL (ref 140–400)
RBC: 4.55 MIL/uL (ref 3.80–5.10)
RDW: 13.5 % (ref 11.0–15.0)
WBC: 9.3 10*3/uL (ref 3.8–10.8)

## 2016-01-05 LAB — BASIC METABOLIC PANEL
BUN: 17 mg/dL (ref 7–25)
CHLORIDE: 100 mmol/L (ref 98–110)
CO2: 23 mmol/L (ref 20–31)
CREATININE: 0.8 mg/dL (ref 0.60–0.93)
Calcium: 9.7 mg/dL (ref 8.6–10.4)
Glucose, Bld: 185 mg/dL — ABNORMAL HIGH (ref 65–99)
POTASSIUM: 4.2 mmol/L (ref 3.5–5.3)
Sodium: 137 mmol/L (ref 135–146)

## 2016-01-05 NOTE — Telephone Encounter (Signed)
Patient is concerned about having a sleep study test done at [facility] on 01/12/16. Patient stated she is not sure if she should have this sleep study done, so should she wait to have sleep study done with our office. Patient wants to know if this study is the same study that Dr. Radford Pax will have done. Will forward to [facility] with the Sleep Study department here at [facility] to see if she can answer patient's questions.

## 2016-01-05 NOTE — Telephone Encounter (Signed)
Left message for patient to call back  

## 2016-01-06 LAB — PROTIME-INR
INR: 1
Prothrombin Time: 10.7 s (ref 9.0–11.5)

## 2016-01-06 NOTE — Telephone Encounter (Signed)
Spoke with patient and gave her the details of the sleep study.  She is still not sure where she wants to have this done, and who she wants to follow her if she does have OSA.   I told her that she would just need to cancel the one with [facility] if she decided to go with her PCP.  She has been given the number to the sleep lab to cancel if she decides.   She is aware that she can call me back with questions.   I let her know to cancel in advance to avoid any charges for late cancellation/no show.   She stated verbal understanding and said that she would call me if she had any further questions.

## 2016-01-07 ENCOUNTER — Other Ambulatory Visit: Payer: Self-pay | Admitting: Cardiovascular Disease

## 2016-01-07 ENCOUNTER — Ambulatory Visit (HOSPITAL_COMMUNITY): Payer: Medicare Other | Admitting: Certified Registered Nurse Anesthetist

## 2016-01-07 ENCOUNTER — Ambulatory Visit (HOSPITAL_COMMUNITY)
Admission: RE | Admit: 2016-01-07 | Discharge: 2016-01-07 | Disposition: A | Discharge status: Home / Self Care | Payer: Medicare Other | Source: Ambulatory Visit | Attending: Cardiovascular Disease | Admitting: Cardiovascular Disease

## 2016-01-07 ENCOUNTER — Telehealth: Payer: Self-pay

## 2016-01-07 ENCOUNTER — Encounter (HOSPITAL_COMMUNITY): Payer: Self-pay | Admitting: *Deleted

## 2016-01-07 ENCOUNTER — Encounter (HOSPITAL_COMMUNITY)
Admission: RE | Disposition: A | Discharge status: Home / Self Care | Payer: Self-pay | Source: Ambulatory Visit | Attending: Cardiovascular Disease

## 2016-01-07 DIAGNOSIS — E669 Obesity, unspecified: Secondary | ICD-10-CM | POA: Diagnosis not present

## 2016-01-07 DIAGNOSIS — J45909 Unspecified asthma, uncomplicated: Secondary | ICD-10-CM | POA: Diagnosis not present

## 2016-01-07 DIAGNOSIS — I4891 Unspecified atrial fibrillation: Secondary | ICD-10-CM | POA: Diagnosis not present

## 2016-01-07 DIAGNOSIS — Z79899 Other long term (current) drug therapy: Secondary | ICD-10-CM | POA: Insufficient documentation

## 2016-01-07 DIAGNOSIS — I10 Essential (primary) hypertension: Secondary | ICD-10-CM | POA: Insufficient documentation

## 2016-01-07 DIAGNOSIS — G4730 Sleep apnea, unspecified: Secondary | ICD-10-CM | POA: Insufficient documentation

## 2016-01-07 DIAGNOSIS — Z6841 Body Mass Index (BMI) 40.0 and over, adult: Secondary | ICD-10-CM | POA: Diagnosis not present

## 2016-01-07 DIAGNOSIS — I480 Paroxysmal atrial fibrillation: Secondary | ICD-10-CM | POA: Diagnosis not present

## 2016-01-07 DIAGNOSIS — R0602 Shortness of breath: Secondary | ICD-10-CM | POA: Diagnosis not present

## 2016-01-07 DIAGNOSIS — Z7901 Long term (current) use of anticoagulants: Secondary | ICD-10-CM | POA: Insufficient documentation

## 2016-01-07 HISTORY — PX: CARDIOVERSION: SHX1299

## 2016-01-07 SURGERY — CARDIOVERSION
Anesthesia: Monitor Anesthesia Care

## 2016-01-07 MED ORDER — PROPOFOL 10 MG/ML IV BOLUS
INTRAVENOUS | Status: DC | PRN
  Administered 2016-01-07: 100 mg via INTRAVENOUS

## 2016-01-07 MED ORDER — LIDOCAINE HCL (CARDIAC) 20 MG/ML IV SOLN
INTRAVENOUS | Status: DC | PRN
  Administered 2016-01-07: 100 mg via INTRAVENOUS

## 2016-01-07 MED ORDER — SODIUM CHLORIDE 0.9 % IV SOLN
INTRAVENOUS | Status: DC | PRN
  Administered 2016-01-07: 13:00:00 via INTRAVENOUS

## 2016-01-07 NOTE — Discharge Instructions (Signed)

## 2016-01-07 NOTE — CV Procedure (Signed)
Anesthesia: Dr Conrad Belmont 100 Diprovan and 100 mg Lidocaine DCC x 3 200Joules On Flecainide and Eliquis  No sinus rhythm  Failed DCC x3 off drugs and now x 3 with flecainide  Given good clinical status and normal EF will rate control and anticoagulate DC flecaindie

## 2016-01-07 NOTE — H&P (View-Only) (Signed)
Cardiology Office Note   Date:  12/30/2015   ID:  Dominique Bauer, DOB 1945/09/19, MRN [id number]  PCP:  [name], MD  Cardiologist:   Jenkins Rouge, MD   Chief Complaint  Patient presents with  . Atrial Fibrillation      History of Present Illness: Dominique Bauer is a 70 y.o. female who presents for evaluation of afib. She is schedule for [facility] on 11/17/15 She is married to one of my other patients. During Our visit last week she indicated being Rx by Dr Sherrie Sport for afib. Has had diaphoresis and palpitations for about 3 months. Echo done 01/23/15 LA 42 mm EF 55-60% Mild to moderate LVH  CRF;s HTN.  Started on beta blocker and Eliquis 7/8  Mild exertional dyspnea no chest pain or syncope  Long discussion about rate control, anticoagulation and conversion Favor at least one attempt at [facility] especially with only mild LAE Risks including stoke discussed   [facility] attempted on 11/17/15 unscuccessful Started on flecainide  ETT on 12/04/15 no proarrhythmia  Plan is for repeat [facility] on AAT  Past Medical History:  Diagnosis Date  . Body mass index 40.0-44.9, adult ([place])   . Dysrhythmia   . H/O measles   . H/O mumps   . H/O: whooping cough   . History of chicken pox   . Hypertension   . Irregular heart rate   . Palpitations   . Paroxysmal atrial fibrillation (HCC)   . Sciatica   . Sleep apnea    not using aything at night right now/LH  . SOB (shortness of breath)     Past Surgical History:  Procedure Laterality Date  . ABDOMINAL HYSTERECTOMY    . BREAST BIOPSY    . CARDIOVERSION N/A 11/17/2015   Procedure: CARDIOVERSION;  Surgeon: [name], MD;  Location: [facility] ENDOSCOPY;  Service: Cardiovascular;  Laterality: N/A;  . DG  BONE DENSITY ([place] HX)    . INNER EAR SURGERY    . NASAL SEPTUM SURGERY       Current Outpatient Prescriptions  Medication Sig Dispense Refill  . acetaminophen (TYLENOL) 650 MG CR tablet Take 650 mg by mouth every 8 (eight) hours as needed for  pain (arthritis).    Marland Kitchen albuterol (PROVENTIL HFA;VENTOLIN HFA) 108 (90 Base) MCG/ACT inhaler Inhale 2 puffs into the lungs every 6 (six) hours as needed for wheezing or shortness of breath.    Marland Kitchen apixaban (ELIQUIS) 5 MG TABS tablet Take 5 mg by mouth 2 (two) times daily.    . benazepril (LOTENSIN) 40 MG tablet Take 40 mg by mouth daily.    . calcium carbonate (CALCIUM 600) 1500 (600 Ca) MG TABS tablet Take 1,500 mg by mouth daily with breakfast.    . flecainide (TAMBOCOR) 50 MG tablet Take 1 tablet (50 mg total) by mouth 2 (two) times daily. 180 tablet 3  . hydrochlorothiazide (HYDRODIURIL) 25 MG tablet Take 25 mg by mouth daily as needed.    . L-LYSINE PO TAKE ONE TABLET BY MOUTH DAILY    . metoprolol tartrate (LOPRESSOR) 25 MG tablet TAKE 1 TABLET  IN THE MORNING AND 1 TABLET IN THE EVENING    . Multiple Vitamins-Minerals (ZINC PO) Take 1 tablet by mouth daily.    . multivitamin-lutein (OCUVITE-LUTEIN) CAPS capsule Take 1 capsule by mouth daily.    . Omega-3 Fatty Acids (FISH OIL) 1000 MG CAPS Take 1,000 mg by mouth daily.    . potassium chloride (K-DUR,KLOR-CON) 10  MEQ tablet Take 10 mEq by mouth daily as needed (W/HCTZ).      No current facility-administered medications for this visit.     Allergies:   Neosporin [neomycin-bacitracin zn-polymyx] and Penicillins    Social History:  Married Does not work Sedentary Denies excess ETOH Non smoker  Family History:  Mother with HTN    ROS:  Please see the history of present illness.   Otherwise, review of systems are positive for none.   All other systems are reviewed and negative.    PHYSICAL EXAM: VS:  BP 130/84   Pulse 75   Ht 5\' 8"  (1.727 m)   Wt 122.4 kg (269 lb 12.8 oz)   SpO2 96%   BMI 41.02 kg/m  , BMI Body mass index is 41.02 kg/m. Affect appropriate Healthy:  appears stated age 65: normal Neck supple with no adenopathy JVP normal no bruits no thyromegaly Lungs clear with no wheezing and good diaphragmatic  motion Heart:  S1/S2 no murmur, no rub, gallop or click PMI normal Abdomen: benighn, BS positve, no tenderness, no AAA no bruit.  No HSM or HJR Distal pulses intact with no bruits No edema Neuro non-focal Skin warm and dry No muscular weakness    EKG:   Afib rate 86 LAD low voltage 10/31/15 11/04/15  afib rate 89 otherwise normal 12/30/15 Afib rate 78 LAD no acute changes QRS 92     Recent Labs: No results found for requested labs within last 8760 hours.    Lipid Panel No results found for: CHOL, TRIG, HDL, CHOLHDL, VLDL, LDLCALC, LDLDIRECT    Wt Readings from Last 3 Encounters:  12/30/15 122.4 kg (269 lb 12.8 oz)  11/04/15 120.3 kg (265 lb 1.9 oz)      Other studies Reviewed: Additional studies/ records that were reviewed today include: Dr Sherrie Sport office Notes Echo .    ASSESSMENT AND PLAN:  1.  Afib: on Eliquis since 10/04/15  Scheduled [place] for 8/21 failed now on flecainide Will schedule repeat [facility] on AAT for 10/11  Labs on 10/9  2. HTN:  Well controlled.  Continue current medications and low sodium Dash type diet.    3. Asthma:  Possible OSA f/u primary consider sleep study to decrease recurrence risk of PAF 4. Obesity:  Discussed low carb diet    Current medicines are reviewed at length with the patient today.  The patient does not have concerns regarding medicines.  The following changes have been made:  no change  Labs/ tests ordered today include: CBC BMET pre [facility]  No orders of the defined types were placed in this encounter.    Disposition:   FU with me post [facility]      Signed, Jenkins Rouge, MD  12/30/2015 9:37 AM    [address], [place], [place]  [address] Phone: [phone]; Fax: [phone]

## 2016-01-07 NOTE — Anesthesia Postprocedure Evaluation (Signed)
Anesthesia Post Note  Patient: MAILEEN MARKLEY  Procedure(s) Performed: Procedure(s) (LRB): CARDIOVERSION (N/A)  Patient location during evaluation: PACU Anesthesia Type: General Level of consciousness: awake and alert Pain management: pain level controlled Vital Signs Assessment: post-procedure vital signs reviewed and stable Respiratory status: spontaneous breathing, nonlabored ventilation, respiratory function stable and patient connected to nasal cannula oxygen Cardiovascular status: blood pressure returned to baseline and stable Postop Assessment: no signs of nausea or vomiting Anesthetic complications: no    Last Vitals:  Vitals:   01/07/16 1317 01/07/16 1325  BP: (!) 111/56 (!) 114/54  Pulse: 75 73  Resp: 18 15  Temp: 36.6 C     Last Pain:  Vitals:   01/07/16 1317  TempSrc: Oral                 Riata Ikeda DAVID

## 2016-01-07 NOTE — Telephone Encounter (Signed)
-----   Message from [name], MD sent at 01/07/2016  1:13 PM EDT ----- Failed [facility] F/U with me next available Stopped flecainide

## 2016-01-07 NOTE — Transfer of Care (Signed)
Immediate Anesthesia Transfer of Care Note  Patient: Dominique Bauer  Procedure(s) Performed: Procedure(s): CARDIOVERSION (N/A)  Patient Location: PACU  Anesthesia Type:General  Level of Consciousness: awake, alert , sedated, patient cooperative and responds to stimulation  Airway & Oxygen Therapy: Patient Spontanous Breathing and Patient connected to nasal cannula oxygen  Post-op Assessment: Report given to RN, Post -op Vital signs reviewed and stable and Patient moving all extremities X 4  Post vital signs: Reviewed and stable  Last Vitals:  Vitals:   01/07/16 1305 01/07/16 1306  BP:    Pulse: (!) 127 (!) 162  Resp: (!) 21 (!) 21  Temp:      Last Pain:  Vitals:   01/07/16 1155  TempSrc: Oral         Complications: No apparent anesthesia complications

## 2016-01-07 NOTE — Interval H&P Note (Signed)
History and Physical Interval Note:  01/07/2016 12:40 PM  Dominique Bauer  has presented today for surgery, with the diagnosis of AFIB  The various methods of treatment have been discussed with the patient and family. After consideration of risks, benefits and other options for treatment, the patient has consented to  Procedure(s): CARDIOVERSION (N/A) as a surgical intervention .  The patient's history has been reviewed, patient examined, no change in status, stable for surgery.  I have reviewed the patient's chart and labs.  Questions were answered to the patient's satisfaction.     Jenkins Rouge

## 2016-01-07 NOTE — Anesthesia Preprocedure Evaluation (Signed)
Anesthesia Evaluation  Patient identified by MRN, date of birth, ID band Patient awake    Reviewed: Allergy & Precautions, NPO status , Patient's Chart, lab work & pertinent test results  Airway Mallampati: I  TM Distance: >3 FB Neck ROM: Full    Dental   Pulmonary sleep apnea ,    Pulmonary exam normal        Cardiovascular hypertension, Pt. on medications Normal cardiovascular exam+ dysrhythmias Atrial Fibrillation      Neuro/Psych    GI/Hepatic   Endo/Other    Renal/GU      Musculoskeletal   Abdominal   Peds  Hematology   Anesthesia Other Findings   Reproductive/Obstetrics                             Anesthesia Physical Anesthesia Plan  ASA: III  Anesthesia Plan: General   Post-op Pain Management:    Induction: Intravenous  Airway Management Planned: Mask  Additional Equipment:   Intra-op Plan:   Post-operative Plan:   Informed Consent: I have reviewed the patients History and Physical, chart, labs and discussed the procedure including the risks, benefits and alternatives for the proposed anesthesia with the patient or authorized representative who has indicated his/her understanding and acceptance.     Plan Discussed with: CRNA and Surgeon  Anesthesia Plan Comments:         Anesthesia Quick Evaluation

## 2016-01-08 ENCOUNTER — Encounter (HOSPITAL_COMMUNITY): Payer: Self-pay | Admitting: Cardiovascular Disease

## 2016-01-09 NOTE — Telephone Encounter (Signed)
Continue metoprolol 25 bid can take extra if she needs it

## 2016-01-09 NOTE — Telephone Encounter (Signed)
Called patient with Dr. Johnsie Cancel recommendation. Patient verbalize understanding.

## 2016-01-09 NOTE — Telephone Encounter (Signed)
Called patient to make an appointment for follow-up. Patient will see Richardson Dopp PA next Friday when Dr. Johnsie Cancel is in the office.   Patient is aware that her flecainide is discontinued. Patient complaining about her heart racing, and having trouble sleeping at night. Patient's HR 68 to 94 and recent BP 117/94. Patient has not taken her Metoprolol this am, so encouraged patient to take her Metoprolol and call back if that does not help. Will forward to Dr. Johnsie Cancel for further advisement.

## 2016-01-16 ENCOUNTER — Ambulatory Visit (INDEPENDENT_AMBULATORY_CARE_PROVIDER_SITE_OTHER): Payer: Medicare Other | Admitting: Physician Assistant

## 2016-01-16 ENCOUNTER — Encounter: Payer: Self-pay | Admitting: Physician Assistant

## 2016-01-16 VITALS — BP 132/86 | HR 75 | Ht 68.0 in | Wt 265.8 lb

## 2016-01-16 DIAGNOSIS — G4733 Obstructive sleep apnea (adult) (pediatric): Secondary | ICD-10-CM | POA: Diagnosis not present

## 2016-01-16 DIAGNOSIS — I10 Essential (primary) hypertension: Secondary | ICD-10-CM | POA: Diagnosis not present

## 2016-01-16 DIAGNOSIS — I4811 Longstanding persistent atrial fibrillation: Secondary | ICD-10-CM

## 2016-01-16 DIAGNOSIS — I481 Persistent atrial fibrillation: Secondary | ICD-10-CM

## 2016-01-16 MED ORDER — METOPROLOL TARTRATE 25 MG PO TABS: 37.5000 mg | ORAL_TABLET | Freq: Two times a day (BID) | ORAL | 3 refills | Status: DC

## 2016-01-16 NOTE — Progress Notes (Signed)
Cardiology Office Note:    Date:  01/16/2016   ID:  Dominique Bauer, DOB [birth year], MRN [id number]  PCP:  [name], MD  Cardiologist:  Dr. Jenkins Rouge   Electrophysiologist:  n/a  Referring MD: [name], MD   Chief Complaint  Patient presents with  . Follow-up    AFib    History of Present Illness:    Dominique Bauer is a 70 y.o. female with a hx of persistent AF, HTN.  She has failed DCCV alone and now has failed DCCV on AAD therapy with Flecainide (01/07/16).  Flecainide has been DCd.  She returns for FU.    CHADS2-VASc=3 (female, 70 yo, HTN).     She is here today with her husband. She notes that she has been quite symptomatic with atrial fibrillation. This has been going on for about one and one-half years. She notes dyspnea with exertion. She has palpitations. She denies syncope but has been fatigued and has had dizziness at times. She denies significant LE edema. She denies any bleeding issues. She denies significant chest discomfort.  Prior CV studies that were reviewed today include:    ETT 12/04/15 Blood pressure demonstrated a normal response to exercise. There was no ST segment deviation noted during stress. Negative, adequate stress test.  Echo 10/16 [facility]) Mild to mod conc LVH, EF 55-60, mild to mod LAE, mild aortic sclerosis, trace MR  Past Medical History:  Diagnosis Date  . Body mass index 40.0-44.9, adult ([place])   . Dysrhythmia   . H/O measles   . H/O mumps   . H/O: whooping cough   . History of chicken pox   . Hypertension   . Irregular heart rate   . Palpitations   . Paroxysmal atrial fibrillation (H[facility])   . Sciatica   . Sleep apnea    not using aything at night right now/LH  . SOB (shortness of breath)     Past Surgical History:  Procedure Laterality Date  . ABDOMINAL HYSTERECTOMY    . BREAST BIOPSY    . CARDIOVERSION N/A 11/17/2015   Procedure: CARDIOVERSION;  Surgeon: [name], MD;  Location: [facility]  ENDOSCOPY;  Service: Cardiovascular;  Laterality: N/A;  . CARDIOVERSION N/A 01/07/2016   Procedure: CARDIOVERSION;  Surgeon: [name], MD;  Location: [facility] ENDOSCOPY;  Service: Cardiovascular;  Laterality: N/A;  . DG  BONE DENSITY ([place] HX)    . INNER EAR SURGERY    . NASAL SEPTUM SURGERY      Current Medications: Current Meds  Medication Sig  . acetaminophen (TYLENOL) 650 MG CR tablet Take 650 mg by mouth every 8 (eight) hours as needed for pain (arthritis).  Marland Kitchen albuterol (PROVENTIL HFA;VENTOLIN HFA) 108 (90 Base) MCG/ACT inhaler Inhale 2 puffs into the lungs every 6 (six) hours as needed for wheezing or shortness of breath.  Marland Kitchen apixaban (ELIQUIS) 5 MG TABS tablet Take 5 mg by mouth 2 (two) times daily.  . benazepril (LOTENSIN) 40 MG tablet Take 40 mg by mouth daily.  . calcium carbonate (CALCIUM 600) 1500 (600 Ca) MG TABS tablet Take 1,500 mg by mouth daily with breakfast.  . hydrochlorothiazide (HYDRODIURIL) 25 MG tablet Take 25 mg by mouth daily as needed (swelling and elevated blood pressure).   Marland Kitchen L-LYSINE PO TAKE ONE TABLET BY MOUTH DAILY  . Multiple Vitamins-Minerals (ZINC PO) Take 1 tablet by mouth daily.  . multivitamin-lutein (OCUVITE-LUTEIN) CAPS capsule Take 1 capsule by mouth daily.  . Omega-3  Fatty Acids (FISH OIL) 1000 MG CAPS Take 1,000 mg by mouth daily.  . potassium chloride (K-DUR,KLOR-CON) 10 MEQ tablet Take 10 mEq by mouth daily as needed (take with HCTZ).   . [DISCONTINUED] metoprolol tartrate (LOPRESSOR) 25 MG tablet Take 25 mg by mouth 2 (two) times daily.      Allergies:   Neosporin [neomycin-bacitracin zn-polymyx] and Penicillins   Social History   Social History  . Marital status: Married    Spouse name: N/A  . Number of children: N/A  . Years of education: N/A   Social History Main Topics  . Smoking status: Never Smoker  . Smokeless tobacco: Never Used  . Alcohol use No  . Drug use: No  . Sexual activity: Not Asked   Other Topics Concern  . None    Social History Narrative  . None     Family History:  The patient's family history includes Hypertension in her mother.   ROS:   Please see the history of present illness.    Review of Systems  Constitution: Positive for diaphoresis and malaise/fatigue.  HENT: Positive for headaches.   Cardiovascular: Positive for dyspnea on exertion and irregular heartbeat.  Respiratory: Positive for snoring.   Skin: Positive for rash.  Musculoskeletal: Positive for back pain, joint swelling and myalgias.  Neurological: Positive for dizziness.   All other systems reviewed and are negative.   EKGs/Labs/Other Test Reviewed:    EKG:  EKG is  ordered today.  The ekg ordered today demonstrates AFib, HR 75  Recent Labs: 01/05/2016: BUN 17; Creat 0.80; Hemoglobin 13.7; Platelets 277; Potassium 4.2; Sodium 137   Recent Lipid Panel No results found for: CHOL, TRIG, HDL, CHOLHDL, VLDL, LDLCALC, LDLDIRECT   Physical Exam:    VS:  BP 132/86   Pulse 75   Ht 5\' 8"  (1.727 m)   Wt 265 lb 12.8 oz (120.6 kg)   BMI 40.41 kg/m     Wt Readings from Last 3 Encounters:  01/16/16 265 lb 12.8 oz (120.6 kg)  01/07/16 269 lb (122 kg)  12/30/15 269 lb 12.8 oz (122.4 kg)     Physical Exam  Constitutional: She is oriented to person, place, and time. She appears well-developed and well-nourished. No distress.  HENT:  Head: Normocephalic and atraumatic.  Eyes: No scleral icterus.  Neck: No JVD present.  Cardiovascular: Normal rate, S1 normal and S2 normal.  An irregularly irregular rhythm present.  No murmur heard. Pulmonary/Chest: She has no wheezes. She has no rales.  Abdominal: There is no tenderness.  Musculoskeletal: She exhibits no edema.  Neurological: She is alert and oriented to person, place, and time.  Skin: Skin is warm and dry.  Psychiatric: She has a normal mood and affect.    ASSESSMENT:    1. Longstanding persistent atrial fibrillation ([place])   2. Essential hypertension   3. OSA  (obstructive sleep apnea)    PLAN:    In order of problems listed above:  1. Persistent AF - She has failed DCCV and now DCCV on Flecainide.  She is quite symptomatic with AF and she will need a strategy of rhythm control.  I reviewed her case today with Dr. Jenkins Rouge   -  Refer to Dr. Thompson Grayer or Dr. Allegra Lai for management of AF  -  I will increase Metoprolol to 37.5 bid to help with symptoms of rapid palpitations.  -  Continue Eliquis.   2. HTN - Controlled.   3. OSA - Sleep  study pending.     Medication Adjustments/Labs and Tests Ordered: Current medicines are reviewed at length with the patient today.  Concerns regarding medicines are outlined above.  Medication changes, Labs and Tests ordered today are outlined in the Patient Instructions noted below. Patient Instructions  Medication Instructions:  Your physician has recommended you make the following change in your medication: 1. INCREASE METOPROLOL TO 37.5 MG TWICE DAILY; THIS WILL BE 1 AND 1/2 TABS TWICE DAILY  Labwork: NONE  Testing/Procedures: NONE  Follow-Up: DR. Johnsie Cancel 3 MONTHS YOU ARE BEING REFERRED TO DR. ALLRED OR DR. Curt Bears  Any Other Special Instructions Will Be Listed Below (If Applicable).  If you need a refill on your cardiac medications before your next appointment, please call your pharmacy.  Signed, Richardson Dopp, PA-C  01/16/2016 1:57 PM    [facility] [place], [place], [place]  [address] Phone: [phone]; Fax: [phone]

## 2016-01-16 NOTE — Patient Instructions (Addendum)
Medication Instructions:  Your physician has recommended you make the following change in your medication: 1. INCREASE METOPROLOL TO 37.5 MG TWICE DAILY; THIS WILL BE 1 AND 1/2 TABS TWICE DAILY  Labwork: NONE  Testing/Procedures: NONE  Follow-Up: DR. Johnsie Cancel 3 MONTHS YOU ARE BEING REFERRED TO DR. ALLRED OR DR. Curt Bears  Any Other Special Instructions Will Be Listed Below (If Applicable).  If you need a refill on your cardiac medications before your next appointment, please call your pharmacy.

## 2016-01-20 ENCOUNTER — Telehealth: Payer: Self-pay | Admitting: Cardiovascular Disease

## 2016-01-20 NOTE — Telephone Encounter (Signed)
New Message:   Please call,have some questions about an order Dr Johnsie Cancel wrote.

## 2016-01-20 NOTE — Telephone Encounter (Signed)
Patient was wondering why she was seeing Dr. [name] and not another EP doctor. Informed patient that Dr. [name] would be the best to see for her PAF. Patient was asking about Dr. [name] and informed her that Dr. [name] does not specialize in Atrial Fibrillation. Informed patient that Dr. [name] runs the A.Fib clinic.  Patient verbalized understanding and will keep her appointment with Dr. [name].

## 2016-01-22 ENCOUNTER — Institutional Professional Consult (permissible substitution): Payer: Medicare Other | Admitting: Cardiology

## 2016-01-28 DIAGNOSIS — I10 Essential (primary) hypertension: Secondary | ICD-10-CM | POA: Diagnosis not present

## 2016-01-28 DIAGNOSIS — I480 Paroxysmal atrial fibrillation: Secondary | ICD-10-CM | POA: Diagnosis not present

## 2016-01-30 ENCOUNTER — Encounter: Payer: Self-pay | Admitting: Internal Medicine

## 2016-01-30 ENCOUNTER — Ambulatory Visit (INDEPENDENT_AMBULATORY_CARE_PROVIDER_SITE_OTHER): Payer: Medicare Other | Admitting: Internal Medicine

## 2016-01-30 VITALS — BP 130/82 | HR 74 | Ht 68.0 in | Wt 266.2 lb

## 2016-01-30 DIAGNOSIS — I480 Paroxysmal atrial fibrillation: Secondary | ICD-10-CM | POA: Diagnosis not present

## 2016-01-30 DIAGNOSIS — I481 Persistent atrial fibrillation: Secondary | ICD-10-CM | POA: Diagnosis not present

## 2016-01-30 DIAGNOSIS — I10 Essential (primary) hypertension: Secondary | ICD-10-CM

## 2016-01-30 DIAGNOSIS — I4811 Longstanding persistent atrial fibrillation: Secondary | ICD-10-CM

## 2016-01-30 NOTE — Progress Notes (Signed)
Electrophysiology Office Note   Date:  01/30/2016   ID:  Dominique Bauer, DOB [birth year], MRN [id number]  PCP:  [name], MD  Cardiologist:  Dr Johnsie Cancel Primary Electrophysiologist: Thompson Grayer, MD    Chief Complaint  Patient presents with  . Atrial Fibrillation     History of Present Illness: Dominique Bauer is a 70 y.o. female who presents today for electrophysiology evaluation.   She has longstanding persistent afib.  She thinks that she has been in AF for about 1.5 years.  She reports symptoms of palpitations and fatigue.  She has SOB with decreased exercise tolerance.  She was recently evaluated by Dr Johnsie Cancel.  She was started on flecainide and failed cardioversion.  She is referred for further evaluation.  She snores.  Sleep study is pending.  Today, she denies symptoms of chest pain, orthopnea, PND, lower extremity edema, claudication, dizziness, presyncope, syncope, bleeding, or neurologic sequela. The patient is tolerating medications without difficulties and is otherwise without complaint today.    Past Medical History:  Diagnosis Date  . Body mass index 40.0-44.9, adult ([place])   . H/O measles   . H/O mumps   . H/O: whooping cough   . History of chicken pox   . Hypertension   . Persistent atrial fibrillation ([place])   . Sciatica   . Sleep apnea    not using aything at night right now/LH   Past Surgical History:  Procedure Laterality Date  . ABDOMINAL HYSTERECTOMY    . BREAST BIOPSY    . CARDIOVERSION N/A 11/17/2015   Procedure: CARDIOVERSION;  Surgeon: [name], MD;  Location: [facility] ENDOSCOPY;  Service: Cardiovascular;  Laterality: N/A;  . CARDIOVERSION N/A 01/07/2016   Procedure: CARDIOVERSION;  Surgeon: [name], MD;  Location: [facility] ENDOSCOPY;  Service: Cardiovascular;  Laterality: N/A;  . DG  BONE DENSITY ([place] HX)    . INNER EAR SURGERY    . NASAL SEPTUM SURGERY       Current Outpatient Prescriptions  Medication Sig Dispense Refill  .  acetaminophen (TYLENOL) 650 MG CR tablet Take 650 mg by mouth every 8 (eight) hours as needed for pain (arthritis).    Marland Kitchen albuterol (PROVENTIL HFA;VENTOLIN HFA) 108 (90 Base) MCG/ACT inhaler Inhale 2 puffs into the lungs every 6 (six) hours as needed for wheezing or shortness of breath.    Marland Kitchen apixaban (ELIQUIS) 5 MG TABS tablet Take 5 mg by mouth 2 (two) times daily.    . benazepril (LOTENSIN) 40 MG tablet Take 40 mg by mouth daily.    . calcium carbonate (CALCIUM 600) 1500 (600 Ca) MG TABS tablet Take 1,500 mg by mouth daily with breakfast.    . hydrochlorothiazide (HYDRODIURIL) 25 MG tablet Take 25 mg by mouth daily as needed (swelling and elevated blood pressure).     Marland Kitchen L-LYSINE PO TAKE ONE TABLET BY MOUTH DAILY    . metoprolol tartrate (LOPRESSOR) 25 MG tablet Take 1.5 tablets (37.5 mg total) by mouth 2 (two) times daily. 270 tablet 3  . Multiple Vitamins-Minerals (ZINC PO) Take 1 tablet by mouth daily.    . multivitamin-lutein (OCUVITE-LUTEIN) CAPS capsule Take 1 capsule by mouth daily.    . Omega-3 Fatty Acids (FISH OIL) 1000 MG CAPS Take 1,000 mg by mouth daily.    . potassium chloride (K-DUR,KLOR-CON) 10 MEQ tablet Take 10 mEq by mouth daily as needed (take with HCTZ).      No current facility-administered medications for this visit.  Allergies:   Neosporin [neomycin-bacitracin zn-polymyx] and Penicillins   Social History:  The patient  reports that she has never smoked. She has never used smokeless tobacco. She reports that she does not drink alcohol or use drugs.   Family History:  The patient's  family history includes Hypertension in her mother.  + afib (both parents)   ROS:  Please see the history of present illness.   All other systems are reviewed and negative.    PHYSICAL EXAM: VS:  BP 130/82   Pulse 74   Ht 5\' 8"  (1.727 m)   Wt 266 lb 3.2 oz (120.7 kg)   BMI 40.48 kg/m  , BMI Body mass index is 40.48 kg/m. GEN: overweight, in no acute distress  HEENT: normal    Neck: no JVD, carotid bruits, or masses Cardiac: iRRR; no murmurs, rubs, or gallops,no edema  Respiratory:  clear to auscultation bilaterally, normal work of breathing GI: soft, nontender, nondistended, + BS MS: no deformity or atrophy  Skin: warm and dry  Neuro:  Strength and sensation are intact Psych: euthymic mood, full affect  EKG:  EKG is ordered today. The ekg ordered today shows rate controlled afib, Qtc 430 msec   Recent Labs: 01/05/2016: BUN 17; Creat 0.80; Hemoglobin 13.7; Platelets 277; Potassium 4.2; Sodium 137    Lipid Panel  No results found for: CHOL, TRIG, HDL, CHOLHDL, VLDL, LDLCALC, LDLDIRECT   Wt Readings from Last 3 Encounters:  01/30/16 266 lb 3.2 oz (120.7 kg)  01/16/16 265 lb 12.8 oz (120.6 kg)  01/07/16 269 lb (122 kg)      Other studies Reviewed: Additional studies/ records that were reviewed today include: Dr Johnsie Cancel and Leta Speller notes, prior echo from 10/16 from primary care  Review of the above records today demonstrates: as above   ASSESSMENT AND PLAN:  1.  Longstanding persistent afib The patient has symptomatic afib.  She has failed medical therapy with flecainide.  Given longstanding persistent afib and morbid obesity, her anticipated success with ablation is low.  I have therefore offered tikosyn and amiodarone.  Given risks which I discussed with patient today, I would recommend tikosyn.  She wishes to contemplate this further.  She will contact our office should she decide to proceed with tikosyn initiation.  She would need to stop hctz prior to initiation. Continue anticoagulation long term.  Chads2vasc score is at least 3. Lifestyle modification discussed at length today Repeat echo to evaluate for structural changes related to her afib.  2. Snoring Sleep study is pending  3. Obesity Body mass index is 40.48 kg/m. Lifestyle modification encouraged  4. HTN Stable No change required today  Follow-up with Dr Johnsie Cancel Should  she decide to consider tikosyn then I am happy to see her to assist  Current medicines are reviewed at length with the patient today.   The patient does not have concerns regarding her medicines.  The following changes were made today:  none  Labs/ tests ordered today include:  Orders Placed This Encounter  Procedures  . EKG 12-Lead  . ECHOCARDIOGRAM COMPLETE     Signed, Thompson Grayer, MD  01/30/2016 5:22 PM     [address] [place] [address] [phone] (office) [phone] (fax)

## 2016-01-30 NOTE — Patient Instructions (Signed)
Medication Instructions:  Your physician recommends that you continue on your current medications as directed. Please refer to the Current Medication list given to you today.    Call insurance company about Tikosyn--- this medication will require a 3 day hospitalization  Labwork: None ordered  Testing/Procedures:  Your physician has requested that you have an echocardiogram. Echocardiography is a painless test that uses sound waves to create images of your heart. It provides your doctor with information about the size and shape of your heart and how well your heart's chambers and valves are working. This procedure takes approximately one hour. There are no restrictions for this procedure.    Follow-Up: Your physician recommends that you schedule a follow-up appointment as needed   Any Other Special Instructions Will Be Listed Below (If Applicable).     If you need a refill on your cardiac medications before your next appointment, please call your pharmacy.

## 2016-02-09 ENCOUNTER — Telehealth: Payer: Self-pay | Admitting: Cardiovascular Disease

## 2016-02-09 NOTE — Telephone Encounter (Signed)
Patient is wanting a second opinion about her A. Fib and would like to see Dr. [name]. Patient's daughter also sees Dr. [name]. Will send message to scheduler for patient to see Dr. [name].

## 2016-02-09 NOTE — Telephone Encounter (Signed)
Mrs. Lovel is calling in reference to her seeing Dr. [name] . Would really like a second opinion about the Ablation. Please call   Thanks

## 2016-02-10 ENCOUNTER — Ambulatory Visit (HOSPITAL_BASED_OUTPATIENT_CLINIC_OR_DEPARTMENT_OTHER): Payer: Medicare Other | Attending: Cardiovascular Disease | Admitting: Cardiovascular Disease

## 2016-02-10 VITALS — Ht 68.0 in | Wt 266.0 lb

## 2016-02-10 DIAGNOSIS — I4819 Other persistent atrial fibrillation: Secondary | ICD-10-CM

## 2016-02-10 DIAGNOSIS — G4733 Obstructive sleep apnea (adult) (pediatric): Secondary | ICD-10-CM

## 2016-02-10 DIAGNOSIS — I481 Persistent atrial fibrillation: Secondary | ICD-10-CM

## 2016-02-15 ENCOUNTER — Ambulatory Visit (HOSPITAL_BASED_OUTPATIENT_CLINIC_OR_DEPARTMENT_OTHER): Payer: Medicare Other | Attending: Cardiovascular Disease | Admitting: Cardiovascular Disease

## 2016-02-15 VITALS — Ht 68.0 in | Wt 266.0 lb

## 2016-02-15 DIAGNOSIS — G4733 Obstructive sleep apnea (adult) (pediatric): Secondary | ICD-10-CM

## 2016-02-15 DIAGNOSIS — I4819 Other persistent atrial fibrillation: Secondary | ICD-10-CM

## 2016-02-16 DIAGNOSIS — L2089 Other atopic dermatitis: Secondary | ICD-10-CM | POA: Diagnosis not present

## 2016-02-17 ENCOUNTER — Other Ambulatory Visit: Payer: Self-pay

## 2016-02-17 ENCOUNTER — Ambulatory Visit (INDEPENDENT_AMBULATORY_CARE_PROVIDER_SITE_OTHER): Payer: Medicare Other

## 2016-02-17 DIAGNOSIS — I480 Paroxysmal atrial fibrillation: Secondary | ICD-10-CM

## 2016-02-24 ENCOUNTER — Encounter: Payer: Self-pay | Admitting: Cardiology

## 2016-02-26 ENCOUNTER — Telehealth: Payer: Self-pay | Admitting: Pediatrics

## 2016-02-27 NOTE — Telephone Encounter (Signed)
Pt is returning [place] phone call for test results

## 2016-02-27 NOTE — Telephone Encounter (Signed)
PT AWARE OF ECHO RESULTS./CY 

## 2016-03-01 ENCOUNTER — Telehealth: Payer: Self-pay

## 2016-03-01 NOTE — Telephone Encounter (Signed)
Informed patient that her appointment Wednesday will be cancelled because her sleep study has not been resulted.  She understands that when her sleep study is resulted, she will be called with results. If she has OSA, she will be set up with CPAP and be seen in the office 10 or so weeks later. If she does not, she will most likely not have to be seen at all. Patient agrees with treatment plan. OV cancelled.

## 2016-03-02 ENCOUNTER — Telehealth: Payer: Self-pay | Admitting: Cardiology

## 2016-03-02 NOTE — Telephone Encounter (Signed)
New message  Pt is returning call  Wants to understand why appt was canceled w/Dr. Radford Pax  Please call back after 2 pm to clarify

## 2016-03-02 NOTE — Telephone Encounter (Signed)
Reiterated to patient that her appointment was cancelled because it was scheduled inappropriately. She will be scheduled as needed pending the results of her sleep study. She was grateful for call.

## 2016-03-03 ENCOUNTER — Encounter: Payer: Self-pay | Admitting: Internal Medicine

## 2016-03-03 ENCOUNTER — Ambulatory Visit: Payer: Medicare Other | Admitting: Cardiology

## 2016-03-03 ENCOUNTER — Ambulatory Visit (INDEPENDENT_AMBULATORY_CARE_PROVIDER_SITE_OTHER): Payer: Medicare Other | Admitting: Internal Medicine

## 2016-03-03 VITALS — BP 130/70 | HR 80 | Ht 68.0 in | Wt 270.0 lb

## 2016-03-03 DIAGNOSIS — I481 Persistent atrial fibrillation: Secondary | ICD-10-CM | POA: Diagnosis not present

## 2016-03-03 DIAGNOSIS — I4819 Other persistent atrial fibrillation: Secondary | ICD-10-CM

## 2016-03-03 NOTE — Patient Instructions (Signed)
Medication Instructions: - Your physician recommends that you continue on your current medications as directed. Please refer to the Current Medication list given to you today.  Labwork: - none ordered  Procedures/Testing: - Your physician has recommended that you have a home sleep study. This test records several body functions during sleep, including: brain activity, eye movement, oxygen and carbon dioxide blood levels, heart rate and rhythm, breathing rate and rhythm, the flow of air through your mouth and nose, snoring, body muscle movements, and chest and belly movement.  ** we will be in touch with you about arranging this- Gae Bon, CMA **  Follow-Up: - please call Nira Conn, RN for Dr. [name], if you decide to proceed with Tikosyn (dofetilide) and we will work on arranging this for you- [phone]  Any Additional Special Instructions Will Be Listed Below (If Applicable).     If you need a refill on your cardiac medications before your next appointment, please call your pharmacy.

## 2016-03-03 NOTE — Progress Notes (Signed)
ELECTROPHYSIOLOGY CONSULT NOTE  Patient ID: Dominique Bauer, MRN: [id number], DOB/AGE: 12/01/1945 70 y.o. Admit date: (Not on file) Date of Consult: 03/03/2016  Primary Physician: [name], MD Primary Cardiologist: [place] Physician none  Chief Complaint: Second opinion regarding atrial fibrillation management   HPI Dominique Bauer is a 70 y.o. female  Who is the mother of Dominique Bauer. She was found to be in atrial fibrillation retrospectively about a year and a half ago. She had been complaining for some time of tachypalpitations ended been treated by her PCP with Valium. Finally an echocardiogram was done which was "normal" coronary review demonstrated atrial fibrillation.  10/16  Moderate LVH amd LA 42.. Normal EF  She finally, through her husband, got connected with the cardiologist, i.e. Dr. Mariana Single  Who undertook cardioversion initially drug fre and then with flecainide; neither were assoc with prolonged sinus.        Past Medical History:  Diagnosis Date  . Body mass index 40.0-44.9, adult ([place])   . H/O measles   . H/O mumps   . H/O: whooping cough   . History of chicken pox   . Hypertension   . Persistent atrial fibrillation ([place])   . Sciatica   . Sleep apnea    not using aything at night right now/LH      Surgical History:  Past Surgical History:  Procedure Laterality Date  . ABDOMINAL HYSTERECTOMY    . BREAST BIOPSY    . CARDIOVERSION N/A 11/17/2015   Procedure: CARDIOVERSION;  Surgeon: [name], MD;  Location: [facility] ENDOSCOPY;  Service: Cardiovascular;  Laterality: N/A;  . CARDIOVERSION N/A 01/07/2016   Procedure: CARDIOVERSION;  Surgeon: [name], MD;  Location: [facility] ENDOSCOPY;  Service: Cardiovascular;  Laterality: N/A;  . DG  BONE DENSITY ([place] HX)    . INNER EAR SURGERY    . NASAL SEPTUM SURGERY       Home Meds: Prior to Admission medications   Medication Sig Start Date End Date Taking? Authorizing Provider  acetaminophen  (TYLENOL) 650 MG CR tablet Take 650 mg by mouth every 8 (eight) hours as needed for pain (arthritis).   Yes Historical Provider, MD  albuterol (PROVENTIL HFA;VENTOLIN HFA) 108 (90 Base) MCG/ACT inhaler Inhale 2 puffs into the lungs every 6 (six) hours as needed for wheezing or shortness of breath.   Yes Historical Provider, MD  apixaban (ELIQUIS) 5 MG TABS tablet Take 5 mg by mouth 2 (two) times daily.   Yes Historical Provider, MD  benazepril (LOTENSIN) 40 MG tablet Take 40 mg by mouth daily.   Yes Historical Provider, MD  calcium carbonate (CALCIUM 600) 1500 (600 Ca) MG TABS tablet Take 1,500 mg by mouth daily with breakfast.   Yes Historical Provider, MD  hydrochlorothiazide (HYDRODIURIL) 25 MG tablet Take 25 mg by mouth daily as needed (swelling and elevated blood pressure).    Yes Historical Provider, MD  L-LYSINE PO TAKE ONE TABLET BY MOUTH DAILY   Yes Historical Provider, MD  metoprolol tartrate (LOPRESSOR) 25 MG tablet Take 1.5 tablets (37.5 mg total) by mouth 2 (two) times daily. 01/16/16 04/15/16 Yes Scott T Weaver, PA-C  Multiple Vitamins-Minerals (ZINC PO) Take 1 tablet by mouth daily.   Yes Historical Provider, MD  multivitamin-lutein (OCUVITE-LUTEIN) CAPS capsule Take 1 capsule by mouth daily.   Yes Historical Provider, MD  Omega-3 Fatty Acids (FISH OIL) 1000 MG CAPS Take 1,000 mg by mouth daily.   Yes Historical Provider,  MD  potassium chloride (K-DUR,KLOR-CON) 10 MEQ tablet Take 10 mEq by mouth daily as needed (take with HCTZ).    Yes Historical Provider, MD    Allergies:  Allergies  Allergen Reactions  . Neosporin [Neomycin-Bacitracin Zn-Polymyx]     rash  . Penicillins     welps all over    Social History   Social History  . Marital status: Married    Spouse name: N/A  . Number of children: N/A  . Years of education: N/A   Occupational History  . Not on file.   Social History Main Topics  . Smoking status: Never Smoker  . Smokeless tobacco: Never Used  . Alcohol  use No  . Drug use: No  . Sexual activity: Not on file   Other Topics Concern  . 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     Family History  Problem Relation Age of Onset  . Hypertension Mother      ROS:  Please see the history of present illness.     All other systems reviewed and negative.    Physical Exam:  Blood pressure 130/70, pulse 80, height 5\' 8"  (1.727 m), weight 270 lb (122.5 kg), SpO2 97 %. General: Well developed, well nourished female in no acute distress. Head: Normocephalic, atraumatic, sclera non-icteric, no xanthomas, nares are without discharge. EENT: normal  Lymph Nodes:  none Neck: Negative for carotid bruits. JVD not elevated. Back:without scoliosis kyphosis\ Lungs: Clear bilaterally to auscultation without wheezes, rales, or rhonchi. Breathing is unlabored. Heart: RRR with S1 S2.  2/6 systolic    No rubs, or gallops appreciated. Abdomen: Soft, non-tender, non-distended with normoactive bowel sounds. No hepatomegaly. No rebound/guarding. No obvious abdominal masses. Msk:  Strength and tone appear normal for age. Extremities: No clubbing or cyanosis.  Tr+\ edema.  Distal pedal pulses are 2+ and equal bilaterally. Skin: Warm and Dry Neuro: Alert and oriented X 3. CN III-XII intact Grossly normal sensory and motor function . Psych:  Responds to questions appropriately with a normal affect.      Labs: Cardiac Enzymes No results for input(s): CKTOTAL, CKMB, TROPONINI in the last 72 hours. CBC Lab Results  Component Value Date   WBC 9.3 01/05/2016   HGB 13.7 01/05/2016   HCT 40.8 01/05/2016   MCV 89.7 01/05/2016   PLT 277 01/05/2016   PROTIME: No results for input(s): LABPROT, INR in the last 72 hours. Chemistry No results for input(s): NA, K, CL, CO2, BUN, CREATININE, CALCIUM, PROT, BILITOT, ALKPHOS, ALT, AST, GLUCOSE in the last 168 hours.  Invalid input(s): LABALBU Lipids No results found for: CHOL, HDL,  LDLCALC, TRIG BNP No results found for: PROBNP Thyroid Function Tests: No results for input(s): TSH, T4TOTAL, T3FREE, THYROIDAB in the last 72 hours.  Invalid input(s): FREET3 Miscellaneous No results found for: DDIMER  Radiology/Studies:  No results found.  EKG: at r fib 80 -.09/38  Assessment and Plan:   Atrial fibrillation-persistent  Hypertension  HFpEF  Sleep disordered breathing  We have had a discussion regarding the rate control versus rhythm control strategies. It is her impression that she feels considerably worse in atrial fibrillation and sinus; hence, pursuing a rhythm control strategy makes sense. She had been introduced to the ideas of amiodarone and dofetilide by Dr. Greggory Brandy; concerned about the safety's of dofetilide. We have reviewed the data from the precious stone trials as they relate to the lack of mortality risk associated with  dofetilide versus placebo. She will talk with her family and decide regarding initiation of dofetilide therapy. She would then need cardioversion on the second hospital day she did not convert spontaneously.  I'm also concerned about sleep apnea. Unfortunately her most recent sleep study (found in MEDIA  section) was nondiagnostic.as there was not enough sleep on her sleep study.  wil arrange for home study      Virl Axe

## 2016-03-10 DIAGNOSIS — I10 Essential (primary) hypertension: Secondary | ICD-10-CM | POA: Diagnosis not present

## 2016-03-10 DIAGNOSIS — I480 Paroxysmal atrial fibrillation: Secondary | ICD-10-CM | POA: Diagnosis not present

## 2016-03-16 NOTE — Procedures (Signed)
Patient Name: Dominique Bauer, Dominique Bauer Date: 02/10/2016 Gender: Female D.O.B: Aug 15, 1945 Age (years): 71 Referring Provider: Jenkins Rouge Height (inches): 68 Interpreting Physician: Shelva Majestic MD, ABSM Weight (lbs): 266 RPSGT: Carolin Coy BMI: 40 MRN: [id number] Neck Size: 18.00  CLINICAL INFORMATION Sleep Study Type: NPSG  Indication for sleep study: Fatigue, Hypertension, Obesity, OSA, Snoring  Epworth Sleepiness Score: 12  SLEEP STUDY TECHNIQUE As per the AASM Manual for the Scoring of Sleep and Associated Events v2.3 (April 2016) with a hypopnea requiring 4% desaturations.  The channels recorded and monitored were frontal, central and occipital EEG, electrooculogram (EOG), submentalis EMG (chin), nasal and oral airflow, thoracic and abdominal wall motion, anterior tibialis EMG, snore microphone, electrocardiogram, and pulse oximetry.  MEDICATIONS acetaminophen (TYLENOL) 650 MG CR tablet albuterol (PROVENTIL HFA;VENTOLIN HFA) 108 (90 Base) MCG/ACT inhaler apixaban (ELIQUIS) 5 MG TABS tablet benazepril (LOTENSIN) 40 MG tablet calcium carbonate (CALCIUM 600) 1500 (600 Ca) MG TABS tablet hydrochlorothiazide (HYDRODIURIL) 25 MG tablet L-LYSINE PO metoprolol tartrate (LOPRESSOR) 25 MG tablet Multiple Vitamins-Minerals (ZINC PO) multivitamin-lutein (OCUVITE-LUTEIN) CAPS capsule Omega-3 Fatty Acids (FISH OIL) 1000 MG CAPS potassium chloride (K-DUR,KLOR-CON) 10 MEQ tablet   Medications self-administered by patient taken the night of the study : eliquis, METOPROLOL TARTRATE, TUMS  SLEEP ARCHITECTURE The study was initiated at 10:39:02 PM and ended at 5:05:01 AM.  Sleep onset time was 35.6 minutes and the sleep efficiency was 70.6%. The total sleep time was 272.4 minutes.  Stage REM latency was N/A minutes.  The patient spent 6.06% of the night in stage N1 sleep, 93.94% in stage N2 sleep, 0.00% in stage N3 and 0.00% in REM.  Alpha intrusion was  absent.  Supine sleep was 65.86%.  RESPIRATORY PARAMETERS The overall apnea/hypopnea index (AHI) was 10.4 per hour. There were 0 total apneas, including 0 obstructive, 0 central and 0 mixed apneas. There were 47 hypopneas and 56 RERAs.  The AHI during Stage REM sleep was N/A per hour.  AHI while supine was 1.0 per hour.  The mean oxygen saturation was 96.12%. The minimum SpO2 during sleep was 91.00%.  Moderate snoring was noted during this study.  CARDIAC DATA The 2 lead EKG demonstrated atrial fibrillation. The mean heart rate was 31.01 beats per minute. Other EKG findings include: Atrial Fibrillation.  LEG MOVEMENT DATA The total PLMS were 0 with a resulting PLMS index of 0.00. Associated arousal with leg movement index was 0.0 .  IMPRESSIONS - Mild obstructive sleep apnea occurred during this study (AHI = 10.4/h). Note computer malfunction resulted in termination of data collection prior to completion of the study.  - No significant central sleep apnea occurred during this study (CAI = 0.0/h). - Reduced sleep efficiency. - The patient had minimal or no oxygen desaturation during the study (Min O2 = 91.00%) - The patient snored with Moderate snoring volume. - EKG findings include Atrial Fibrillation. - Clinically significant periodic limb movements did not occur during sleep. No significant associated arousals.  DIAGNOSIS - Obstructive Sleep Apnea (327.23 [G47.33 ICD-10])  RECOMMENDATIONS - In this patient with significant cardiovascular comorbidities recommend therapeutic CPAP titration to determine optimal pressure required to alleviate sleep disordered breathing. - Efforts should be made to optimize nasal and oropharyngeal patency. - Avoid alcohol, sedatives and other CNS depressants that may worsen sleep apnea and disrupt normal sleep architecture. - Sleep hygiene should be reviewed to assess factors that may improve sleep quality. - Weight management and regular exercise  should be initiated or continued if appropriate.  [  Electronically signed] 03/16/2016 07:14 AM  Shelva Majestic MD,  [facility],  [place], American Board of Sleep Medicine   NPI: [id number]  [place] PH: [phone]   FX: [phone] [place]

## 2016-03-16 NOTE — Procedures (Signed)
Patient Name: Dominique Bauer, Dominique Bauer Date: 02/15/2016 Gender: Female D.O.B: July 16, 1945 Age (years): 54 Referring Provider: Jenkins Rouge Height (inches): 12 Interpreting Physician: Shelva Majestic MD, ABSM Weight (lbs): 266 RPSGT: Baxter Flattery BMI: 40 MRN: [id number] Neck Size: 18.00  CLINICAL INFORMATION Sleep Study Type: NPSG Indication for sleep study: Fatigue, Obesity, OSA, Snoring, Witnessed Apneas Epworth Sleepiness Score: 12  The most recent polysomnogram dated 02/10/2016 revealed an AHI of 10.4/h and RDI of 22.7/h. The split night study is repeated due to computer malfunction on the 02/10/16 evaluation. Although the test was scheduled as a split night study, due to reduced sleep efficiency, there was inadequate time to do the CPAP titration.   SLEEP STUDY TECHNIQUE As per the AASM Manual for the Scoring of Sleep and Associated Events v2.3 (April 2016) with a hypopnea requiring 4% desaturations.  The channels recorded and monitored were frontal, central and occipital EEG, electrooculogram (EOG), submentalis EMG (chin), nasal and oral airflow, thoracic and abdominal wall motion, anterior tibialis EMG, snore microphone, electrocardiogram, and pulse oximetry.  MEDICATIONS acetaminophen (TYLENOL) 650 MG CR tablet albuterol (PROVENTIL HFA;VENTOLIN HFA) 108 (90 Base) MCG/ACT inhaler apixaban (ELIQUIS) 5 MG TABS tablet benazepril (LOTENSIN) 40 MG tablet calcium carbonate (CALCIUM 600) 1500 (600 Ca) MG TABS tablet hydrochlorothiazide (HYDRODIURIL) 25 MG tablet L-LYSINE PO metoprolol tartrate (LOPRESSOR) 25 MG tablet Multiple Vitamins-Minerals (ZINC PO) multivitamin-lutein (OCUVITE-LUTEIN) CAPS capsule Omega-3 Fatty Acids (FISH OIL) 1000 MG CAPS potassium chloride (K-DUR,KLOR-CON) 10 MEQ tablet  Medications self-administered by patient taken the night of the study : eliquis, METOPROLOL TARTRATE, TUMS  SLEEP ARCHITECTURE The study was initiated at 10:39:33 PM and ended  at 5:22:09 AM.  Sleep onset time was 35.4 minutes and the sleep efficiency was 62.1%. The total sleep time was 250.2 minutes.  Stage REM latency was 132.0 minutes.  The patient spent 8.19% of the night in stage N1 sleep, 77.82% in stage N2 sleep, 0.00% in stage N3 and 13.99% in REM.  Alpha intrusion was absent.  Supine sleep was 55.68%.  RESPIRATORY PARAMETERS The overall apnea/hypopnea index (AHI) was 31.2 per hour. There were 15 total apneas, including 15 obstructive, 0 central and 0 mixed apneas. There were 115 hypopneas and 27 RERAs.  The AHI during Stage REM sleep was 58.3 per hour.  AHI while supine was 46.1 per hour.  The mean oxygen saturation was 92.03%. The minimum SpO2 during sleep was 69.00%.  Moderate snoring was noted during this study.  CARDIAC DATA The 2 lead EKG demonstrated sinus rhythm. The mean heart rate was 82.31 beats per minute. Other EKG findings include: None.  LEG MOVEMENT DATA The total PLMS were 0 with a resulting PLMS index of 0.00. Associated arousal with leg movement index was 0.0 .  IMPRESSIONS - Moderate obstructive sleep apnea occurred during this study (AHI = 31.2/h); however, sleep apnea was severe during REM sleep.  On this study, the patient had significantly more events than the 02/10/16 study in which data collection was terminated early due to computer malfunction.  - No significant central sleep apnea occurred during this study (CAI = 0.0/h). - Severe oxygen desaturation to a nadir of 69.00%. - Reduced sleep efficiency. - Abnormal sleep architecture with absence of stage 3 sleep and prolonged latency to REM sleep.  - The patient snored with Moderate snoring volume. - No cardiac abnormalities were noted during this study. - Clinically significant periodic limb movements did not occur during sleep. No significant associated arousals.  DIAGNOSIS - Obstructive Sleep Apnea (327.23 [  G47.33 ICD-10]) - Nocturnal Hypoxemia (327.26 [G47.36  ICD-10])  RECOMMENDATIONS - In this patient with significant cardiovascular comorbidities recommend therapeutic  CPAP titration to determine optimal pressure required to alleviate sleep disordered breathing. - Efforts should be made to optimize nasal and oropharyngeal patency. - Patient should try to avoid supine sleep.  - Avoid alcohol, sedatives and other CNS depressants that may worsen sleep apnea and disrupt normal sleep architecture. - Sleep hygiene should be reviewed to assess factors that may improve sleep quality. - Weight management and regular exercise should be initiated or continued if appropriate.  [Electronically signed] 03/16/2016 07:37 AM  Shelva Majestic MD, [facility], [place], American Board of Sleep Medicine   NPI: [id number]  [place] PH: [phone]   FX: [phone] [place]

## 2016-03-18 ENCOUNTER — Other Ambulatory Visit: Payer: Self-pay | Admitting: *Deleted

## 2016-03-18 DIAGNOSIS — G4733 Obstructive sleep apnea (adult) (pediatric): Secondary | ICD-10-CM

## 2016-03-18 NOTE — Progress Notes (Signed)
Patient notified of sleep study results and recommendations. Voiced understanding. Patient thanked me for explaining everything to her. She states that she was "confused" about what was going on with her appointments. Titration study ordered.

## 2016-03-18 NOTE — Progress Notes (Signed)
Patient notified of sleep study results and recommendations. Voiced understanding.

## 2016-03-31 DIAGNOSIS — M9903 Segmental and somatic dysfunction of lumbar region: Secondary | ICD-10-CM | POA: Diagnosis not present

## 2016-03-31 DIAGNOSIS — M9904 Segmental and somatic dysfunction of sacral region: Secondary | ICD-10-CM | POA: Diagnosis not present

## 2016-03-31 DIAGNOSIS — M5430 Sciatica, unspecified side: Secondary | ICD-10-CM | POA: Diagnosis not present

## 2016-03-31 DIAGNOSIS — M9902 Segmental and somatic dysfunction of thoracic region: Secondary | ICD-10-CM | POA: Diagnosis not present

## 2016-03-31 DIAGNOSIS — I973 Postprocedural hypertension: Secondary | ICD-10-CM | POA: Diagnosis not present

## 2016-04-01 DIAGNOSIS — I10 Essential (primary) hypertension: Secondary | ICD-10-CM | POA: Diagnosis not present

## 2016-04-01 DIAGNOSIS — J343 Hypertrophy of nasal turbinates: Secondary | ICD-10-CM | POA: Diagnosis not present

## 2016-04-01 DIAGNOSIS — H6123 Impacted cerumen, bilateral: Secondary | ICD-10-CM | POA: Diagnosis not present

## 2016-04-01 DIAGNOSIS — H6122 Impacted cerumen, left ear: Secondary | ICD-10-CM | POA: Insufficient documentation

## 2016-04-01 DIAGNOSIS — H7202 Central perforation of tympanic membrane, left ear: Secondary | ICD-10-CM | POA: Diagnosis not present

## 2016-04-01 DIAGNOSIS — H7292 Unspecified perforation of tympanic membrane, left ear: Secondary | ICD-10-CM | POA: Insufficient documentation

## 2016-04-01 DIAGNOSIS — I480 Paroxysmal atrial fibrillation: Secondary | ICD-10-CM | POA: Diagnosis not present

## 2016-04-15 NOTE — Progress Notes (Signed)
Cardiology Office Note:    Date:  04/16/2016   ID:  Dominique Bauer, DOB 1945-06-17, MRN [id number]  PCP:  [name], MD  Cardiologist:  Dr. Jenkins Rouge   Electrophysiologist:  n/a  Referring MD: [name], MD   No chief complaint on file.   History of Present Illness:    Dominique Bauer is a 71 y.o. female with a hx of persistent AF, HTN.  She has failed DCCV alone and now has failed DCCV on AAD therapy with Flecainide (01/07/16).  Flecainide has been DCd.  She returns for FU.    CHADS2-VASc=3 (female, 71 yo, HTN).     She is here today with her husband. She notes that she has been quite symptomatic with atrial fibrillation. This has been going on for about 2 years. She notes dyspnea with exertion. She has palpitations. She denies syncope but has been fatigued and has had dizziness at times. She denies significant LE edema. She denies any bleeding issues. She denies significant chest discomfort. Seen by both Dr [name] and most recently by  Dr [name] 03/04/16 and they discussed amiodarone or tikosyn Rx.    Long discussion with her and husband. Her fear is keeping her from making a decision. She seems to think That tikosyn will increase her risk of stroke. After much discussion I think her fears were assuaged and she will see about being admitted to hospital next week for tikosyn load. Discussed 3 day hospital stay with [facility] if She tolerates med  Prior CV studies that were reviewed today include:    ETT 12/04/15 Blood pressure demonstrated a normal response to exercise. There was no ST segment deviation noted during stress. Negative, adequate stress test.  Echo 10/16 [facility]) Mild to mod conc LVH, EF 55-60, mild to mod LAE, mild aortic sclerosis, trace MR  Past Medical History:  Diagnosis Date  . Body mass index 40.0-44.9, adult ([place])   . H/O measles   . H/O mumps   . H/O: whooping cough   . History of chicken pox   . Hypertension   . Persistent atrial  fibrillation ([place])   . Sciatica   . Sleep apnea    not using aything at night right now/LH    Past Surgical History:  Procedure Laterality Date  . ABDOMINAL HYSTERECTOMY    . BREAST BIOPSY    . CARDIOVERSION N/A 11/17/2015   Procedure: CARDIOVERSION;  Surgeon: [name], MD;  Location: [facility] ENDOSCOPY;  Service: Cardiovascular;  Laterality: N/A;  . CARDIOVERSION N/A 01/07/2016   Procedure: CARDIOVERSION;  Surgeon: [name], MD;  Location: [facility] ENDOSCOPY;  Service: Cardiovascular;  Laterality: N/A;  . DG  BONE DENSITY ([place] HX)    . INNER EAR SURGERY    . NASAL SEPTUM SURGERY      Current Medications: Current Meds  Medication Sig  . acetaminophen (TYLENOL) 650 MG CR tablet Take 650 mg by mouth every 8 (eight) hours as needed for pain (arthritis).  Marland Kitchen albuterol (PROVENTIL HFA;VENTOLIN HFA) 108 (90 Base) MCG/ACT inhaler Inhale 2 puffs into the lungs every 6 (six) hours as needed for wheezing or shortness of breath.  Marland Kitchen apixaban (ELIQUIS) 5 MG TABS tablet Take 5 mg by mouth 2 (two) times daily.  . benazepril (LOTENSIN) 40 MG tablet Take 40 mg by mouth daily.  . calcium carbonate (CALCIUM 600) 1500 (600 Ca) MG TABS tablet Take 1,500 mg by mouth daily with breakfast.  . hydrochlorothiazide (HYDRODIURIL) 25 MG  tablet Take 25 mg by mouth daily as needed (swelling and elevated blood pressure).   Marland Kitchen L-LYSINE PO TAKE ONE TABLET BY MOUTH DAILY  . Multiple Vitamins-Minerals (ZINC PO) Take 1 tablet by mouth daily.  . multivitamin-lutein (OCUVITE-LUTEIN) CAPS capsule Take 1 capsule by mouth daily.  . Omega-3 Fatty Acids (FISH OIL) 1000 MG CAPS Take 1,000 mg by mouth daily.  . potassium chloride (K-DUR,KLOR-CON) 10 MEQ tablet Take 10 mEq by mouth daily as needed (take with HCTZ).      Allergies:   Neosporin [neomycin-bacitracin zn-polymyx] and Penicillins   Social History   Social History  . Marital status: Married    Spouse name: N/A  . Number of children: N/A  . Years of education: N/A    Social History Main Topics  . Smoking status: Never Smoker  . Smokeless tobacco: Never Used  . Alcohol use No  . Drug use: No  . Sexual activity: Not Asked   Other Topics Concern  . 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     Family History:  The patient's family history includes Hypertension in her mother.   ROS:   Please see the history of present illness.    Review of Systems  Constitution: Positive for diaphoresis and malaise/fatigue.  Cardiovascular: Positive for dyspnea on exertion and irregular heartbeat.  Respiratory: Positive for snoring.   Skin: Positive for rash.  Musculoskeletal: Positive for back pain, joint swelling and myalgias.  Neurological: Positive for dizziness and headaches.   All other systems reviewed and are negative.   EKGs/Labs/Other Test Reviewed:    EKG:  EKG is  ordered today.  The ekg ordered today demonstrates AFib, HR 75  Recent Labs: 01/05/2016: BUN 17; Creat 0.80; Hemoglobin 13.7; Platelets 277; Potassium 4.2; Sodium 137   Recent Lipid Panel No results found for: CHOL, TRIG, HDL, CHOLHDL, VLDL, LDLCALC, LDLDIRECT   Physical Exam:    VS:  BP 122/80 (BP Location: Right Arm)   Pulse 96   Ht 5\' 8"  (1.727 m)   Wt 267 lb 12.8 oz (121.5 kg)   BMI 40.72 kg/m     Wt Readings from Last 3 Encounters:  04/16/16 267 lb 12.8 oz (121.5 kg)  03/03/16 270 lb (122.5 kg)  02/15/16 266 lb (120.7 kg)     Physical Exam  Constitutional: She is oriented to person, place, and time. She appears well-developed and well-nourished. No distress.  HENT:  Head: Normocephalic and atraumatic.  Eyes: No scleral icterus.  Neck: No JVD present.  Cardiovascular: Normal rate, S1 normal and S2 normal.  An irregularly irregular rhythm present.  No murmur heard. Pulmonary/Chest: She has no wheezes. She has no rales.  Abdominal: There is no tenderness.  Musculoskeletal: She exhibits no edema.  Neurological: She is alert and oriented  to person, place, and time.  Skin: Skin is warm and dry.  Psychiatric: She has a normal mood and affect.    ASSESSMENT:    No diagnosis found. PLAN:    In order of problems listed above:  1. Persistent AF - She has failed DCCV and now DCCV on Flecainide.  She is quite symptomatic with AF .  -  Continue Eliquis.   - Labs at office next week if K/Mag/Cr ok admit to hospital under EP service for tikosyn load  Also discussed ablation with her if she cannot take tikosyn and I would pursue this with EP if meds fail Given how symptomatic she is  in afib  2. HTN - Controlled.   3. OSA - home sleep study pending Have contacted Dr Evette Georges nurse to facilitate this given association Of [employer] OSA with recurrent afib    Medication Adjustments/Labs and Tests Ordered: Current medicines are reviewed at length with the patient today.  Concerns regarding medicines are outlined above.  Medication changes, Labs and Tests ordered today are outlined in the Patient Instructions noted below. Patient Instructions  Medication Instructions:  Your physician recommends that you continue on your current medications as directed. Please refer to the Current Medication list given to you today.  Labwork: NONE  Testing/Procedure: NONE  Follow-Up: Your physician wants you to follow-up in: 3 months with Dr. Johnsie Cancel.  Your physician recommends that you schedule an appointment with pharmacist next week for possible Tikosyn start.  Your physician recommends that you schedule a follow-up with Dr. Claiborne Billings at home sleep study.    If you need a refill on your cardiac medications before your next appointment, please call your pharmacy.     Signed, Jenkins Rouge, MD  04/16/2016 4:03 PM    [facility] [place], [place], [place]  [address] Phone: [phone]; Fax: [phone]

## 2016-04-16 ENCOUNTER — Encounter: Payer: Self-pay | Admitting: Cardiovascular Disease

## 2016-04-16 ENCOUNTER — Ambulatory Visit (INDEPENDENT_AMBULATORY_CARE_PROVIDER_SITE_OTHER): Payer: Medicare Other | Admitting: Cardiovascular Disease

## 2016-04-16 VITALS — BP 122/80 | HR 96 | Ht 68.0 in | Wt 267.8 lb

## 2016-04-16 DIAGNOSIS — I480 Paroxysmal atrial fibrillation: Secondary | ICD-10-CM

## 2016-04-16 NOTE — Patient Instructions (Addendum)
Medication Instructions:  Your physician recommends that you continue on your current medications as directed. Please refer to the Current Medication list given to you today.  Labwork: NONE  Testing/Procedure: NONE  Follow-Up: Your physician wants you to follow-up in: 3 months with Dr. Johnsie Cancel.  Your physician recommends that you schedule an appointment with pharmacist next week for possible Tikosyn start.  Your physician recommends that you schedule a follow-up with Dr. Claiborne Billings at home sleep study.    If you need a refill on your cardiac medications before your next appointment, please call your pharmacy.

## 2016-04-19 ENCOUNTER — Other Ambulatory Visit: Payer: Self-pay | Admitting: Pharmacist

## 2016-04-19 DIAGNOSIS — M9904 Segmental and somatic dysfunction of sacral region: Secondary | ICD-10-CM | POA: Diagnosis not present

## 2016-04-19 DIAGNOSIS — M9902 Segmental and somatic dysfunction of thoracic region: Secondary | ICD-10-CM | POA: Diagnosis not present

## 2016-04-19 DIAGNOSIS — I973 Postprocedural hypertension: Secondary | ICD-10-CM | POA: Diagnosis not present

## 2016-04-19 DIAGNOSIS — M9903 Segmental and somatic dysfunction of lumbar region: Secondary | ICD-10-CM | POA: Diagnosis not present

## 2016-04-19 DIAGNOSIS — M5430 Sciatica, unspecified side: Secondary | ICD-10-CM | POA: Diagnosis not present

## 2016-04-23 ENCOUNTER — Ambulatory Visit: Payer: Medicare Other

## 2016-04-27 ENCOUNTER — Encounter: Payer: Self-pay | Admitting: Pharmacist

## 2016-04-27 ENCOUNTER — Ambulatory Visit (INDEPENDENT_AMBULATORY_CARE_PROVIDER_SITE_OTHER): Payer: Medicare Other | Admitting: Pharmacist

## 2016-04-27 ENCOUNTER — Inpatient Hospital Stay (HOSPITAL_COMMUNITY)
Admission: AD | Admit: 2016-04-27 | Discharge: 2016-04-30 | DRG: 309 | Disposition: A | Discharge status: Home / Self Care | Payer: Medicare Other | Source: Ambulatory Visit | Attending: Internal Medicine | Admitting: Internal Medicine

## 2016-04-27 VITALS — Wt 266.6 lb

## 2016-04-27 DIAGNOSIS — G4733 Obstructive sleep apnea (adult) (pediatric): Secondary | ICD-10-CM | POA: Diagnosis not present

## 2016-04-27 DIAGNOSIS — I4891 Unspecified atrial fibrillation: Secondary | ICD-10-CM | POA: Diagnosis present

## 2016-04-27 DIAGNOSIS — R0602 Shortness of breath: Secondary | ICD-10-CM | POA: Diagnosis not present

## 2016-04-27 DIAGNOSIS — Z7901 Long term (current) use of anticoagulants: Secondary | ICD-10-CM

## 2016-04-27 DIAGNOSIS — R0600 Dyspnea, unspecified: Secondary | ICD-10-CM | POA: Diagnosis present

## 2016-04-27 DIAGNOSIS — Z79899 Other long term (current) drug therapy: Secondary | ICD-10-CM

## 2016-04-27 DIAGNOSIS — I4819 Other persistent atrial fibrillation: Secondary | ICD-10-CM | POA: Diagnosis present

## 2016-04-27 DIAGNOSIS — E669 Obesity, unspecified: Secondary | ICD-10-CM | POA: Diagnosis present

## 2016-04-27 DIAGNOSIS — Z6841 Body Mass Index (BMI) 40.0 and over, adult: Secondary | ICD-10-CM

## 2016-04-27 DIAGNOSIS — I10 Essential (primary) hypertension: Secondary | ICD-10-CM | POA: Diagnosis present

## 2016-04-27 DIAGNOSIS — I481 Persistent atrial fibrillation: Secondary | ICD-10-CM | POA: Diagnosis not present

## 2016-04-27 DIAGNOSIS — R002 Palpitations: Secondary | ICD-10-CM | POA: Diagnosis not present

## 2016-04-27 LAB — BASIC METABOLIC PANEL
BUN / CREAT RATIO: 25 (ref 12–28)
BUN: 18 mg/dL (ref 8–27)
CALCIUM: 10.7 mg/dL — AB (ref 8.7–10.3)
CO2: 26 mmol/L (ref 18–29)
Chloride: 98 mmol/L (ref 96–106)
Creatinine, Ser: 0.73 mg/dL (ref 0.57–1.00)
GFR calc Af Amer: 96 mL/min/{1.73_m2} (ref >59)
GFR, EST NON AFRICAN AMERICAN: 84 mL/min/{1.73_m2} (ref >59)
Glucose: 150 mg/dL — ABNORMAL HIGH (ref 65–99)
POTASSIUM: 4.2 mmol/L (ref 3.5–5.2)
SODIUM: 131 mmol/L — AB (ref 134–144)

## 2016-04-27 LAB — MAGNESIUM: MAGNESIUM: 1.8 mg/dL (ref 1.6–2.3)

## 2016-04-27 MED ORDER — OCUVITE-LUTEIN PO CAPS: 1.0000 | ORAL_CAPSULE | Freq: Every day | ORAL | Status: DC

## 2016-04-27 MED ORDER — ALBUTEROL SULFATE (2.5 MG/3ML) 0.083% IN NEBU: 2.5000 mg | INHALATION_SOLUTION | Freq: Four times a day (QID) | RESPIRATORY_TRACT | Status: DC | PRN

## 2016-04-27 MED ORDER — ZOLPIDEM TARTRATE 5 MG PO TABS: 5.0000 mg | ORAL_TABLET | Freq: Every evening | ORAL | Status: DC | PRN

## 2016-04-27 MED ORDER — SODIUM CHLORIDE 0.9% FLUSH
3.0000 mL | Freq: Two times a day (BID) | INTRAVENOUS | Status: DC
  Administered 2016-04-28 – 2016-04-30 (×4): 3 mL via INTRAVENOUS

## 2016-04-27 MED ORDER — DOFETILIDE 250 MCG PO CAPS
500.0000 ug | ORAL_CAPSULE | Freq: Two times a day (BID) | ORAL | Status: DC
  Administered 2016-04-27 – 2016-04-28 (×3): 500 ug via ORAL
  Filled 2016-04-27 (×3): qty 2

## 2016-04-27 MED ORDER — OMEGA-3-ACID ETHYL ESTERS 1 G PO CAPS
1.0000 g | ORAL_CAPSULE | Freq: Two times a day (BID) | ORAL | Status: DC
  Administered 2016-04-27 – 2016-04-30 (×6): 1 g via ORAL
  Filled 2016-04-27 (×6): qty 1

## 2016-04-27 MED ORDER — SODIUM CHLORIDE 0.9% FLUSH: 3.0000 mL | INTRAVENOUS | Status: DC | PRN

## 2016-04-27 MED ORDER — ONDANSETRON HCL 4 MG/2ML IJ SOLN
4.0000 mg | Freq: Four times a day (QID) | INTRAMUSCULAR | Status: DC | PRN
  Administered 2016-04-28: 4 mg via INTRAVENOUS
  Filled 2016-04-27: qty 2

## 2016-04-27 MED ORDER — PROSIGHT PO TABS
1.0000 | ORAL_TABLET | Freq: Every day | ORAL | Status: DC
  Administered 2016-04-28 – 2016-04-30 (×3): 1 via ORAL
  Filled 2016-04-27 (×3): qty 1

## 2016-04-27 MED ORDER — FISH OIL 1000 MG PO CAPS: 1000.0000 mg | ORAL_CAPSULE | Freq: Every day | ORAL | Status: DC

## 2016-04-27 MED ORDER — METOPROLOL TARTRATE 25 MG PO TABS
37.5000 mg | ORAL_TABLET | Freq: Two times a day (BID) | ORAL | Status: DC
  Administered 2016-04-27 – 2016-04-30 (×6): 37.5 mg via ORAL
  Filled 2016-04-27 (×6): qty 1

## 2016-04-27 MED ORDER — SODIUM CHLORIDE 0.9 % IV SOLN: 250.0000 mL | INTRAVENOUS | Status: DC | PRN

## 2016-04-27 MED ORDER — ACETAMINOPHEN 325 MG PO TABS: 650.0000 mg | ORAL_TABLET | ORAL | Status: DC | PRN

## 2016-04-27 MED ORDER — APIXABAN 5 MG PO TABS
5.0000 mg | ORAL_TABLET | Freq: Two times a day (BID) | ORAL | Status: DC
  Administered 2016-04-27 – 2016-04-30 (×6): 5 mg via ORAL
  Filled 2016-04-27 (×6): qty 1

## 2016-04-27 MED ORDER — MAGNESIUM OXIDE 400 (241.3 MG) MG PO TABS
200.0000 mg | ORAL_TABLET | Freq: Every day | ORAL | Status: DC
  Administered 2016-04-27 – 2016-04-28 (×2): 200 mg via ORAL
  Filled 2016-04-27 (×2): qty 1

## 2016-04-27 MED ORDER — BENAZEPRIL HCL 40 MG PO TABS
40.0000 mg | ORAL_TABLET | Freq: Every day | ORAL | Status: DC
Start: 2016-04-27 — End: 2016-04-30
  Administered 2016-04-28 – 2016-04-30 (×3): 40 mg via ORAL
  Filled 2016-04-27 (×3): qty 1

## 2016-04-27 MED ORDER — TRIAMCINOLONE ACETONIDE 0.1 % EX CREA
1.0000 "application " | TOPICAL_CREAM | Freq: Every day | CUTANEOUS | Status: DC
  Administered 2016-04-28 – 2016-04-30 (×2): 1 via TOPICAL
  Filled 2016-04-27: qty 15

## 2016-04-27 MED ORDER — ALPRAZOLAM 0.25 MG PO TABS: 0.2500 mg | ORAL_TABLET | Freq: Two times a day (BID) | ORAL | Status: DC | PRN

## 2016-04-27 MED ORDER — MAGNESIUM OXIDE 400 (241.3 MG) MG PO TABS: 400.0000 mg | ORAL_TABLET | Freq: Every day | ORAL | Status: DC | PRN

## 2016-04-27 NOTE — Patient Instructions (Signed)
We will call you when labs have resulted and when to head over to the hospital.

## 2016-04-27 NOTE — Progress Notes (Signed)
Patient direct admit to 2W01, A&Ox4, VSS. Tele applied and CCMD notified. Patient oriented to room and call light placed within reach. No questions at this time. Will place page for admission orders. Nursing will continue to monitor.

## 2016-04-27 NOTE — Progress Notes (Signed)
Patient ID: [name]                 DOB: March 12, 1946                    MRN: [id number]     HPI: Dominique Bauer is a 71 y.o. female patient of Dr. Johnsie Bauer who presents today for Tikosyn initiation. PMH includes persistent AF and HTN. She has failed DCCV alone and DCCV on AAD therapy with Flecainide (01/07/16). Flecainide was discontinued. CHADSVASC is 49 (female, 71 year old, HTN). She was recently seen by Dr. Johnsie Bauer and found to be symptomatic with Afib. She has been fatigued and has dyspnea on exertion. She has been seen by Dr. Caryl Bauer in the past and discussed both amiodarone and Tikosyn. It appears that both medications were discussed at length with both Dr. Caryl Bauer and Dr. Johnsie Bauer and the patient decided to proceed with Tikosyn initiation. She was prescribed HCTZ as needed for swelling and states that she has not had a dose of this or her potassium in several months. She is aware to avoid this and Benedryl (she was previously using this PRN for allergies). She will call with any edema issues. She reports that her copay will be $95 for brand and $45 for generic and she would prefer generic. This is cost-effective for her at this time, but she is concerned about donut hole. Advised to call if/when she reaches if assitance needed.  Pt educated on potential risks with Tikosyn including QTc prolongation. Pt is aware of the importance of not missing any doses and will call the office if they miss more than 2 doses in a row. Pt is anticoagulated with Eliquis and reports no missed doses within the past month. Pt is currently not taking any QTc prolongating or contraindicated medications.   EKG: Reviewed by Dr. Lovena Bauer - Vent rate 78 bpm, QTc 417 ms in atrial fibrillation, left axis deviation, septal infarct, age undetermined, Abnormal ECG  Labs: 1/30 Scr 0.73mg /dL Crcl 167mL/min  K 4.34mmol/L  Mag 1.8mg /dL Lab results to be scanned into epic as Epic did not interface with [facility] and results were  faxed over.   Past Medical History:  Diagnosis Date  . Body mass index 40.0-44.9, adult ([place])   . H/O measles   . H/O mumps   . H/O: whooping cough   . History of chicken pox   . Hypertension   . Persistent atrial fibrillation ([place])   . Sciatica   . Sleep apnea    not using aything at night right now/LH    Current Outpatient Prescriptions on File Prior to Visit  Medication Sig Dispense Refill  . acetaminophen (TYLENOL) 650 MG CR tablet Take 650 mg by mouth every 8 (eight) hours as needed for pain (arthritis).    Marland Kitchen albuterol (PROVENTIL HFA;VENTOLIN HFA) 108 (90 Base) MCG/ACT inhaler Inhale 2 puffs into the lungs every 6 (six) hours as needed for wheezing or shortness of breath.    Marland Kitchen apixaban (ELIQUIS) 5 MG TABS tablet Take 5 mg by mouth 2 (two) times daily.    . benazepril (LOTENSIN) 40 MG tablet Take 40 mg by mouth daily.    . calcium carbonate (CALCIUM 600) 1500 (600 Ca) MG TABS tablet Take 1,500 mg by mouth daily with breakfast.    . L-LYSINE PO TAKE ONE TABLET BY MOUTH DAILY    . metoprolol tartrate (LOPRESSOR) 25 MG tablet Take 1.5 tablets (37.5 mg total) by mouth 2 (two)  times daily. 270 tablet 3  . Multiple Vitamins-Minerals (ZINC PO) Take 1 tablet by mouth daily.    . multivitamin-lutein (OCUVITE-LUTEIN) CAPS capsule Take 1 capsule by mouth daily.    . Omega-3 Fatty Acids (FISH OIL) 1000 MG CAPS Take 1,000 mg by mouth daily.    . potassium chloride (K-DUR,KLOR-CON) 10 MEQ tablet Take 10 mEq by mouth daily as needed (take with HCTZ).      No current facility-administered medications on file prior to visit.     Allergies  Allergen Reactions  . Neosporin [Neomycin-Bacitracin Zn-Polymyx]     rash  . Penicillins     welps all over    Assessment/Plan:  1. Atrial fibrillation - Pt appropriate for admission for Tikosyn load based on K 4.2, Scr 0.73, and Mag 1.8.  Based on CrCl 128mL/min recommend starting dose of Tikosyn 565mcg BID. Inpatient admitting notified and patient  aware to report when called by Inpatient admitting as there are currently no beds available. Inpatient cards team made aware of pending admission as well.    Thank you, Lelan Pons. Patterson Hammersmith, PharmD, BCPS, [place]  [id number] [place],  [address]  Phone: [phone]; Fax: [phone] 04/27/2016 9:04 AM

## 2016-04-27 NOTE — Progress Notes (Signed)
Pharmacy Review for Dofetilide (Tikosyn) Initiation  Admit Complaint: 71 y.o. female admitted 04/27/2016 with atrial fibrillation to be initiated on dofetilide.   Assessment:  Patient Exclusion Criteria: If any screening criteria checked as "Yes", then  patient  should NOT receive dofetilide until criteria item is corrected. If "Yes" please indicate correction plan.  YES  NO Patient  Exclusion Criteria Correction Plan  []  [x]  Baseline QTc interval is greater than or equal to 440 msec. IF above YES box checked dofetilide contraindicated unless patient has ICD; then may proceed if QTc 500-550 msec or with known ventricular conduction abnormalities may proceed with QTc 550-600 msec. QTc =  417   []  [x]  Magnesium level is less than 1.8 mEq/l : Last magnesium:  Lab Results  Component Value Date   MG 1.8 04/27/2016      Placed on Mag ox 200 mg PO   []  [x]  Potassium level is less than 4 mEq/l : Last potassium:  Lab Results  Component Value Date   K 4.2 04/27/2016         []  [x]  Patient is known or suspected to have a digoxin level greater than 2 ng/ml: No results found for: DIGOXIN    []  [x]  Creatinine clearance less than 20 ml/min (calculated using Cockcroft-Gault, actual body weight and serum creatinine): Estimated Creatinine Clearance: 89.8 mL/min (by C-G formula based on SCr of 0.73 mg/dL).    []  [x]  Patient has received drugs known to prolong the QT intervals within the last 48 hours (phenothiazines, tricyclics or tetracyclic antidepressants, erythromycin, H-1 antihistamines, cisapride, fluoroquinolones, azithromycin). Drugs not listed above may have an, as yet, undetected potential to prolong the QT interval, updated information on QT prolonging agents is available at this website:QT prolonging agents   []  [x]  Patient received a dose of hydrochlorothiazide (Oretic) alone or in any combination including triamterene (Dyazide, Maxzide) in the last 48 hours.   []  [x]  Patient received a  medication known to increase dofetilide plasma concentrations prior to initial dofetilide dose:  . Trimethoprim (Primsol, Proloprim) in the last 36 hours . Verapamil (Calan, Verelan) in the last 36 hours or a sustained release dose in the last 72 hours . Megestrol (Megace) in the last 5 days  . Cimetidine (Tagamet) in the last 6 hours . Ketoconazole (Nizoral) in the last 24 hours . Itraconazole (Sporanox) in the last 48 hours  . Prochlorperazine (Compazine) in the last 36 hours    []  [x]  Patient is known to have a history of torsades de pointes; congenital or acquired long QT syndromes.   []  [x]  Patient has received a Class 1 antiarrhythmic with less than 2 half-lives since last dose. (Disopyramide, Quinidine, Procainamide, Lidocaine, Mexiletine, Flecainide, Propafenone)   []  [x]  Patient has received amiodarone therapy in the past 3 months or amiodarone level is greater than 0.3 ng/ml.    Patient has been appropriately anticoagulated with apixaban.  Ordering provider was confirmed at [email or web address] if they are not listed on the [facility] Prescribers list.  Goal of Therapy: Follow renal function, electrolytes, potential drug interactions, and dose adjustment. Provide education and 1 week supply at discharge.  Plan:  [x]   Physician selected initial dose within range recommended for patients level of renal function - will monitor for response.  []   Physician selected initial dose outside of range recommended for patients level of renal function - will discuss if the dose should be altered at this time.   Select One Calculated CrCl  Dose q12h  [x]  >  60 ml/min 500 mcg  []  40-60 ml/min 250 mcg  []  20-40 ml/min 125 mcg   2. Follow up QTc after the first 5 doses, renal function, electrolytes (K & Mg) daily x 3 days, dose adjustment, success of initiation and facilitate 1 week discharge supply as clinically indicated.   Vincenza Hews, PharmD, BCPS 04/27/2016, 7:47 PM Pager:  [phone]

## 2016-04-27 NOTE — H&P (Signed)
History and Physical   Patient ID: Dominique Bauer MRN: [id number], DOB/AGE: May 10, 1945 71 y.o. Date of Encounter: 04/27/2016  Primary Physician: [name], MD Primary Cardiologist: Dr Johnsie Cancel  Chief Complaint:  Atrial fib, Tikosyn  HPI: Dominique Bauer is a 71 y.o. female with a history of persistent AF, HTN.  She has failed DCCV alone and now has failed DCCV on AAD therapy with Flecainide (01/07/16). CHADS2-VASc=3 (female, 71 yo, HTN).     She is here with her husband. She notes that she has been quite symptomatic with atrial fibrillation. This has been going on for about 2 years. She notes dyspnea with exertion. She has palpitations. She denies syncope but has been fatigued and has had dizziness at times. She denies significant LE edema. She denies any bleeding issues. She denies significant chest discomfort. Seen by both Dr [name] and most recently by  Dr [name] 03/04/16 and they discussed amiodarone or tikosyn Rx.    Pt seen by Dr Johnsie Cancel 01/19 and plan was for Tikosyn admission the following week after labs. She had BMET and Mg today and the lab were ok. She is here for admission.  No changes to symptoms. Pt still wishes to have Tikosyn initiation.   Past Medical History:  Diagnosis Date  . Body mass index 40.0-44.9, adult ([place])   . H/O measles   . H/O mumps   . H/O: whooping cough   . History of chicken pox   . Hypertension   . Persistent atrial fibrillation ([place])   . Sciatica   . Sleep apnea    not using aything at night right now/LH    Surgical History:  Past Surgical History:  Procedure Laterality Date  . ABDOMINAL HYSTERECTOMY    . BREAST BIOPSY    . CARDIOVERSION N/A 11/17/2015   Procedure: CARDIOVERSION;  Surgeon: [name], MD;  Location: [facility] ENDOSCOPY;  Service: Cardiovascular;  Laterality: N/A;  . CARDIOVERSION N/A 01/07/2016   Procedure: CARDIOVERSION;  Surgeon: [name], MD;  Location[facility] ENDOSCOPY;  Service: Cardiovascular;  Laterality:  N/A;  . DG  BONE DENSITY ([place] [facility])    . INNER EAR SURGERY    . NASAL SEPTUM SURGERY       I have reviewed the patient's current medications. Prior to Admission medications   Medication Sig Start Date End Date Taking? Authorizing Provider  acetaminophen (TYLENOL) 650 MG CR tablet Take 650 mg by mouth every 8 (eight) hours as needed for pain (arthritis).    Historical Provider, MD  albuterol (PROVENTIL HFA;VENTOLIN HFA) 108 (90 Base) MCG/ACT inhaler Inhale 2 puffs into the lungs every 6 (six) hours as needed for wheezing or shortness of breath.    Historical Provider, MD  apixaban (ELIQUIS) 5 MG TABS tablet Take 5 mg by mouth 2 (two) times daily.    Historical Provider, MD  benazepril (LOTENSIN) 40 MG tablet Take 40 mg by mouth daily.    Historical Provider, MD  calcium carbonate (CALCIUM 600) 1500 (600 Ca) MG TABS tablet Take 1,500 mg by mouth daily with breakfast.    Historical Provider, MD  calcium carbonate (TUMS - DOSED IN MG ELEMENTAL CALCIUM) 500 MG chewable tablet Chew 1 tablet by mouth daily as needed for indigestion or heartburn.    Historical Provider, MD  L-LYSINE PO TAKE ONE TABLET BY MOUTH DAILY    Historical Provider, MD  metoprolol tartrate (LOPRESSOR) 25 MG tablet Take 1.5 tablets (37.5 mg total) by mouth 2 (two)  times daily. 01/16/16 04/27/16  [name], PA-C  multivitamin-lutein (OCUVITE-LUTEIN) CAPS capsule Take 1 capsule by mouth daily.    Historical Provider, MD  Omega-3 Fatty Acids (FISH OIL) 1000 MG CAPS Take 1,000 mg by mouth daily.    Historical Provider, MD   Scheduled Meds: Continuous Infusions: PRN Meds:.  Allergies:  Allergies  Allergen Reactions  . Neosporin [Neomycin-Bacitracin Zn-Polymyx]     rash  . Penicillins     welps all over    Social History   Social History  . Marital status: Married    Spouse name: N/A  . Number of children: N/A  . Years of education: N/A   Occupational History  . Not on file.   Social History Main Topics  .  Smoking status: Never Smoker  . Smokeless tobacco: Never Used  . Alcohol use No  . Drug use: No  . Sexual activity: Not on file   Other Topics Concern  . Not on file   Social History Narrative   Retired Marine scientist.  Lives in [place].    Family History  Problem Relation Age of Onset  . Hypertension Mother    Family Status  Relation Status  . Mother Deceased  . Father Deceased  . Maternal Grandmother Deceased  . Maternal Grandfather Deceased  . Paternal Grandmother Deceased  . Paternal Grandfather Deceased    Review of Systems:   Full 14-point review of systems otherwise negative except as noted above.  Physical Exam: Blood pressure 135/69, pulse 94, temperature 98 F (36.7 C), temperature source Oral, resp. rate 18, height 5\' 8"  (1.727 m), weight 267 lb 9.6 oz (121.4 kg), SpO2 98 %. General: Well developed, well nourished,female in no acute distress. Head: Normocephalic, atraumatic, sclera non-icteric, no xanthomas, nares are without discharge. Dentition: fair Neck: No carotid bruits. JVD not elevated. No thyromegally Lungs: Good expansion bilaterally. without wheezes or rhonchi.  Heart: IRRegular rate and rhythm with S1 S2.  No S3 or S4.  No murmur, no rubs, or gallops appreciated. Abdomen: Soft, non-tender, non-distended with normoactive bowel sounds. No hepatomegaly. No rebound/guarding. No obvious abdominal masses. Msk:  Strength and tone appear normal for age. No joint deformities or effusions, no spine or costo-vertebral angle tenderness. Extremities: No clubbing or cyanosis. No edema.  Distal pedal pulses are 2+ in 4 extrem Neuro: Alert and oriented X 3. Moves all extremities spontaneously. No focal deficits noted. Psych:  Responds to questions appropriately with a normal affect. Skin: No rashes or lesions noted  Labs:   Lab Results  Component Value Date   WBC 9.3 01/05/2016   HGB 13.7 01/05/2016   HCT 40.8 01/05/2016   MCV 89.7 01/05/2016   PLT 277 01/05/2016      Recent Labs Lab 04/27/16 1159  NA 131*  K 4.2  CL 98  CO2 26  BUN 18  CREATININE 0.73  CALCIUM 10.7*  GLUCOSE 150*   Magnesium  Date Value Ref Range Status  04/27/2016 1.8 1.6 - 2.3 mg/dL Final    Radiology/Studies: No results found.   Cardiac Cath: n/a  Echo: 11/212017  ECG: Atrial fib, HR 78, intervals ok  ASSESSMENT AND PLAN:  Principal Problem:   Persistent atrial fibrillation (HCC) - pt very symptomatic, tolerates it poorly - no stoppers on lab - has been off Flecainide for weeks  Active Problems:   Hypertension - follow on home rx    Chronic anticoagulation - continue Eliquis, no doses missed  Signed, Rosaria Ferries, PA-C 04/27/2016 7:29 PM  Beeper [phone]   I have seen and examined the patient along with Barrett, Suanne Marker, PA-C.  I have reviewed the chart, notes and new data.  I agree with PA's note.  Key new complaints: she feels palpitations and general malaise when in atrial fibrillation. Denies dyspnea, syncope or angina Key examination changes: irregular rhythm, obesity, otherwise an unremarkable CV exam Key new findings / data: ECG reviewed, QTc is quite short at baseline. Labs reviewed and do not preclude initiation of dofetilide. She does have mild hypocalcemia and may need to stop her Ca supplements/reassess need for vit D.  PLAN: Start dofetilide with close QT and rhythm monitoring over next 72 hours.  Sanda Klein, MD, [employer] [phone] 04/27/2016, 8:44 PM

## 2016-04-28 ENCOUNTER — Telehealth: Payer: Self-pay | Admitting: *Deleted

## 2016-04-28 ENCOUNTER — Encounter (HOSPITAL_COMMUNITY): Payer: Self-pay | Admitting: General Practice

## 2016-04-28 LAB — BASIC METABOLIC PANEL
Anion gap: 11 (ref 5–15)
BUN: 25 mg/dL — AB (ref 6–20)
CHLORIDE: 99 mmol/L — AB (ref 101–111)
CO2: 26 mmol/L (ref 22–32)
CREATININE: 0.95 mg/dL (ref 0.44–1.00)
Calcium: 9.4 mg/dL (ref 8.9–10.3)
GFR calc Af Amer: >60 mL/min (ref >60)
GFR calc non Af Amer: 59 mL/min — ABNORMAL LOW (ref >60)
GLUCOSE: 182 mg/dL — AB (ref 65–99)
Potassium: 4.1 mmol/L (ref 3.5–5.1)
Sodium: 136 mmol/L (ref 135–145)

## 2016-04-28 LAB — MAGNESIUM: Magnesium: 1.7 mg/dL (ref 1.7–2.4)

## 2016-04-28 MED ORDER — MAGNESIUM SULFATE IN D5W 1-5 GM/100ML-% IV SOLN
1.0000 g | Freq: Once | INTRAVENOUS | Status: AC
  Administered 2016-04-28: 1 g via INTRAVENOUS
  Filled 2016-04-28: qty 100

## 2016-04-28 MED ORDER — SODIUM CHLORIDE 0.9% FLUSH: 3.0000 mL | INTRAVENOUS | Status: DC | PRN

## 2016-04-28 MED ORDER — SODIUM CHLORIDE 0.9% FLUSH
3.0000 mL | Freq: Two times a day (BID) | INTRAVENOUS | Status: DC
  Administered 2016-04-30: 3 mL via INTRAVENOUS

## 2016-04-28 MED ORDER — MAGNESIUM OXIDE 400 (241.3 MG) MG PO TABS
400.0000 mg | ORAL_TABLET | Freq: Every day | ORAL | Status: DC
  Administered 2016-04-29 – 2016-04-30 (×2): 400 mg via ORAL
  Filled 2016-04-28 (×2): qty 1

## 2016-04-28 MED ORDER — SODIUM CHLORIDE 0.9 % IV SOLN: 250.0000 mL | INTRAVENOUS | Status: DC

## 2016-04-28 NOTE — Progress Notes (Signed)
    SUBJECTIVE: The patient is doing well today.  At this time, she denies chest pain, shortness of breath, or any new concerns.  Marland Kitchen apixaban  5 mg Oral BID  . benazepril  40 mg Oral Daily  . dofetilide  500 mcg Oral BID  . magnesium oxide  200 mg Oral Daily  . metoprolol tartrate  37.5 mg Oral BID  . multivitamin  1 tablet Oral Daily  . omega-3 acid ethyl esters  1 g Oral BID  . sodium chloride flush  3 mL Intravenous Q12H  . triamcinolone cream  1 application Topical Daily     OBJECTIVE: Physical Exam: Vitals:   04/27/16 1815 04/27/16 2035 04/28/16 0216 04/28/16 0409  BP: 135/69 (!) 148/68 126/86 105/61  Pulse: 94 97  94  Resp: 18 18  18   Temp: 98 F (36.7 C) 97.5 F (36.4 C)  97.9 F (36.6 C)  TempSrc: Oral Oral  Oral  SpO2: 98% 100%  94%  Weight: 267 lb 9.6 oz (121.4 kg)     Height: 5\' 8"  (1.727 m)       Intake/Output Summary (Last 24 hours) at 04/28/16 0931 Last data filed at 04/28/16 0200  Gross per 24 hour  Intake              490 ml  Output              400 ml  Net               90 ml    Telemetry is reviewed by myself, currently AFib 80's, no V arrhythmias  GEN- The patient is well appearing, alert and oriented x 3 today.   Head- normocephalic, atraumatic Eyes-  Sclera clear, conjunctiva pink Ears- hearing intact Oropharynx- clear Neck- supple, no JVP Lungs- CTA b/l, normal work of breathing Heart- IRRR, no significant murmurs, no rubs or gallops GI- soft, NT, ND Extremities- no clubbing, cyanosis, or edema Skin- no rash or lesion Psych- euthymic mood, full affect Neuro- no gross deficits appreciated  LABS: Basic Metabolic Panel:  Recent Labs  04/27/16 1159 04/28/16 0248  NA 131* 136  K 4.2 4.1  CL 98 99*  CO2 26 26  GLUCOSE 150* 182*  BUN 18 25*  CREATININE 0.73 0.95  CALCIUM 10.7* 9.4  MG 1.8 1.7    ASSESSMENT AND PLAN:   1. Persistent AFib, Tikosyn initiation protocol     CHA2DS2Vasc is 3, on Eliquis     EKG was reviewed by  Dr. [name], QTc stable to continue     K+ 4.1     Mag 1.7, will order additional replacement IV     Creat 0.95 (calc clearance is 106)      2. HTN     No changes  Tommye Standard, PA-C 04/28/2016 9:31 AM  For cardioversion in am if has not converted spontaneously Mg supplementation

## 2016-04-28 NOTE — Care Management Note (Signed)
Case Management Note Marvetta Gibbons RN, BSN Unit 2W-Case Manager [phone]  Patient Details  Name: Dominique Bauer MRN: [id number] Date of Birth: 1945/05/22  Subjective/Objective:   Pt admitted with afib for Tikosyn load                 Action/Plan: PTA pt lived at home with spouse- anticipate return home- referral received for Tikosyn needs- per insurance check- [employer]  @ [place] # [phone]   TIKOSYN [address]  CO-PAY- $ 95.00  TIER- 4 [place] [facility]   Spoke with pt at bedside- coverage info shared- pt states that [facility] [place] [facility] will need script with refills sent to pharmacy to order drug in stock- pt will also need 7 day supply from [place] on day of discharge (please provide script to send to [place])  Expected Discharge Date:                  Expected Discharge Plan:  Home/Self Care  In-House Referral:     Discharge planning Services  CM Consult, Medication Assistance  Post Acute Care Choice:    Choice offered to:     DME Arranged:    DME Agency:     HH Arranged:    HH Agency:     Status of Service:  Completed, signed off  If discussed at [employer], dates discussed:    Additional Comments:  Dawayne Sheretta, RN 04/28/2016, 4:15 PM

## 2016-04-28 NOTE — Discharge Instructions (Signed)
You have an appointment set up with the [facility].  Multiple studies have shown that being followed by a dedicated atrial fibrillation clinic in addition to the standard care you receive from your other physicians improves health. We believe that enrollment in the atrial fibrillation clinic will allow [place] to better care for you.   The phone number to the [facility] is [phone]. The clinic is staffed Monday through Friday from 8:30am to 5pm.  Parking Directions: The clinic is located in the Heart and Vascular Building connected to [facility]. 1)From [address] turn on to [address] and go to the 3rd entrance  (Heart and Vascular entrance) on the right. 2)Look to the right for Heart &Vascular Parking Garage. 3) code for the entrance is 5001.   4)Take the elevators to the 1st floor. Registration is in the room with the glass walls at the end of the hallway.  If you have any trouble parking or locating the clinic, please dont hesitate to call [phone].

## 2016-04-28 NOTE — Progress Notes (Signed)
Insurance check completed for [employer]  @ [place] # [phone]   TIKOSYN [address]  CO-PAY- $ 95.00  TIER- 4 [employer]  PRIOR APPROVAL- NO  [facility]

## 2016-04-29 ENCOUNTER — Encounter (HOSPITAL_BASED_OUTPATIENT_CLINIC_OR_DEPARTMENT_OTHER): Payer: Medicare Other

## 2016-04-29 ENCOUNTER — Encounter (HOSPITAL_COMMUNITY): Payer: Self-pay | Admitting: *Deleted

## 2016-04-29 ENCOUNTER — Inpatient Hospital Stay (HOSPITAL_COMMUNITY): Payer: Medicare Other | Admitting: Anesthesiology

## 2016-04-29 ENCOUNTER — Encounter (HOSPITAL_COMMUNITY)
Admission: AD | Disposition: A | Discharge status: Home / Self Care | Payer: Self-pay | Source: Ambulatory Visit | Attending: Internal Medicine

## 2016-04-29 ENCOUNTER — Ambulatory Visit (HOSPITAL_COMMUNITY): Admission: RE | Admit: 2016-04-29 | Payer: Medicare Other | Source: Ambulatory Visit | Admitting: Cardiovascular Disease

## 2016-04-29 DIAGNOSIS — R0602 Shortness of breath: Secondary | ICD-10-CM

## 2016-04-29 HISTORY — PX: CARDIOVERSION: SHX1299

## 2016-04-29 LAB — BASIC METABOLIC PANEL
ANION GAP: 10 (ref 5–15)
BUN: 22 mg/dL — ABNORMAL HIGH (ref 6–20)
CALCIUM: 9.2 mg/dL (ref 8.9–10.3)
CO2: 25 mmol/L (ref 22–32)
Chloride: 102 mmol/L (ref 101–111)
Creatinine, Ser: 0.82 mg/dL (ref 0.44–1.00)
GFR calc Af Amer: >60 mL/min (ref >60)
GFR calc non Af Amer: >60 mL/min (ref >60)
GLUCOSE: 159 mg/dL — AB (ref 65–99)
Potassium: 4.3 mmol/L (ref 3.5–5.1)
SODIUM: 137 mmol/L (ref 135–145)

## 2016-04-29 LAB — MAGNESIUM: MAGNESIUM: 2.1 mg/dL (ref 1.7–2.4)

## 2016-04-29 SURGERY — CARDIOVERSION
Anesthesia: General

## 2016-04-29 MED ORDER — DOFETILIDE 250 MCG PO CAPS
375.0000 ug | ORAL_CAPSULE | Freq: Two times a day (BID) | ORAL | Status: DC
  Administered 2016-04-29 – 2016-04-30 (×3): 375 ug via ORAL
  Filled 2016-04-29 (×3): qty 1

## 2016-04-29 MED ORDER — PROPOFOL 10 MG/ML IV BOLUS
INTRAVENOUS | Status: DC | PRN
  Administered 2016-04-29: 60 mg via INTRAVENOUS

## 2016-04-29 MED ORDER — SODIUM CHLORIDE 0.9 % IV SOLN
INTRAVENOUS | Status: DC
  Administered 2016-04-29: 10:00:00 via INTRAVENOUS

## 2016-04-29 MED ORDER — LIDOCAINE HCL (CARDIAC) 20 MG/ML IV SOLN
INTRAVENOUS | Status: DC | PRN
  Administered 2016-04-29: 40 mg via INTRAVENOUS

## 2016-04-29 NOTE — Transfer of Care (Signed)
Immediate Anesthesia Transfer of Care Note  Patient: Dominique Bauer  Procedure(s) Performed: Procedure(s): CARDIOVERSION (N/A)  Patient Location: PACU and Endoscopy Unit  Anesthesia Type:General  Level of Consciousness: awake, alert , oriented and patient cooperative  Airway & Oxygen Therapy: Patient Spontanous Breathing and Patient connected to nasal cannula oxygen  Post-op Assessment: Report given to RN and Post -op Vital signs reviewed and stable  Post vital signs: Reviewed and stable  Last Vitals:  Vitals:   04/29/16 0911 04/29/16 0930  BP: 119/60 (!) 164/92  Pulse: 100 97  Resp: 14 20  Temp: 36.7 C 36.5 C    Last Pain:  Vitals:   04/29/16 0930  TempSrc: Oral  PainSc:          Complications: No apparent anesthesia complications

## 2016-04-29 NOTE — Consult Note (Signed)
[facility] CM Primary Care Navigator  04/29/2016  Dominique Bauer 08/10/1945 [id number]  Met with patient and husband Dominique Bauer) at the bedside to identify possible discharge needs. Patient reports having extreme fatigue, decreased energy and shortness of breath that had led to this admission.  Patient endorses Dr. Stoney Bang with [facility] as the primary care provider.    Patient shared using [facility] in [place] to obtain medications without any problem.  [facility] -[facility] as stated.  She reports managing her own medications at home straight out of the containers.   Patient's husband provides transportation to her doctors' appointments.  She mentioned being independent with self care prior to admission, but husband will be assisting her with care at home when needed per patient .   Discharge plan is home according to patient.  Patient voiced understanding to call primary care provider's office when she gets back home, for a post discharge follow-up appointment within a week or sooner if needs arise. Patient letter (with PCP's contact number) was provided as a reminder.  Patient voiced no other needs or concerns at this time.  For additional questions please contact:  Dominique Bauer, BSN, RN-BC [facility] PRIMARY CARE Navigator Cell: [phone]

## 2016-04-29 NOTE — Anesthesia Preprocedure Evaluation (Signed)
Anesthesia Evaluation  Patient identified by MRN, date of birth, ID band Patient awake    Reviewed: Allergy & Precautions, NPO status , Patient's Chart, lab work & pertinent test results  Airway Mallampati: III  TM Distance: >3 FB Neck ROM: Full    Dental no notable dental hx. (+) Teeth Intact   Pulmonary sleep apnea ,    Pulmonary exam normal breath sounds clear to auscultation       Cardiovascular hypertension, Pt. on medications Normal cardiovascular exam Rhythm:Irregular Rate:Normal     Neuro/Psych  Neuromuscular disease negative psych ROS   GI/Hepatic negative GI ROS, Neg liver ROS,   Endo/Other  Morbid obesityHyperglycemia  Renal/GU negative Renal ROS  negative genitourinary   Musculoskeletal negative musculoskeletal ROS (+)   Abdominal (+) + obese,   Peds  Hematology Eliquis- last dose today   Anesthesia Other Findings   Reproductive/Obstetrics negative OB ROS                             Lab Results  Component Value Date   WBC 9.3 01/05/2016   HGB 13.7 01/05/2016   HCT 40.8 01/05/2016   MCV 89.7 01/05/2016   PLT 277 01/05/2016     Chemistry      Component Value Date/Time   NA 137 04/29/2016 0226   NA 131 (L) 04/27/2016 1159   K 4.3 04/29/2016 0226   CL 102 04/29/2016 0226   CO2 25 04/29/2016 0226   BUN 22 (H) 04/29/2016 0226   BUN 18 04/27/2016 1159   CREATININE 0.82 04/29/2016 0226   CREATININE 0.80 01/05/2016 1247      Component Value Date/Time   CALCIUM 9.2 04/29/2016 0226     EKG: atrial fibrillation, rate normal.  Anesthesia Physical Anesthesia Plan  ASA: III  Anesthesia Plan: General   Post-op Pain Management:    Induction: Intravenous  Airway Management Planned: Natural Airway and Nasal Cannula  Additional Equipment:   Intra-op Plan:   Post-operative Plan:   Informed Consent: I have reviewed the patients History and Physical, chart, labs and  discussed the procedure including the risks, benefits and alternatives for the proposed anesthesia with the patient or authorized representative who has indicated his/her understanding and acceptance.   Dental advisory given  Plan Discussed with: Anesthesiologist, CRNA and Surgeon  Anesthesia Plan Comments:         Anesthesia Quick Evaluation

## 2016-04-29 NOTE — Op Note (Signed)
Procedure: Electrical Cardioversion Indications:  Atrial Fibrillation  Procedure Details:  Consent: Risks of procedure as well as the alternatives and risks of each were explained to the (patient/caregiver).  Consent for procedure obtained.  Time Out: Verified patient identification, verified procedure, site/side was marked, verified correct patient position, special equipment/implants available, medications/allergies/relevent history reviewed, required imaging and test results available.  Performed  Patient placed on cardiac monitor, pulse oximetry, supplemental oxygen as necessary.  Sedation given: 60 mg IV propofol, Dr. Royce Macadamia Pacer pads placed anterior and posterior chest.  Cardioverted 2 time(s).  Cardioversion with synchronized biphasic 120J (unsuccessful)  and 200J shocks.  Evaluation: Findings: Post procedure EKG shows: NSR Complications: None Patient did tolerate procedure well.  Time Spent Directly with the Patient:  30 minutes   Ernesta Trabert 04/29/2016, 9:33 AM

## 2016-04-29 NOTE — Anesthesia Postprocedure Evaluation (Signed)
Anesthesia Post Note  Patient: Dominique Bauer  Procedure(s) Performed: Procedure(s) (LRB): CARDIOVERSION (N/A)  Patient location during evaluation: PACU Anesthesia Type: General Level of consciousness: awake and alert Pain management: pain level controlled Vital Signs Assessment: post-procedure vital signs reviewed and stable Respiratory status: spontaneous breathing, nonlabored ventilation and respiratory function stable Cardiovascular status: blood pressure returned to baseline and stable Postop Assessment: no signs of nausea or vomiting Anesthetic complications: no       Last Vitals:  Vitals:   04/29/16 0911 04/29/16 0930  BP: 119/60 (!) 164/92  Pulse: 100 97  Resp: 14 20  Temp: 36.7 C 36.5 C    Last Pain:  Vitals:   04/29/16 0930  TempSrc: Oral  PainSc:                  Emilie Carp A.

## 2016-04-29 NOTE — Progress Notes (Signed)
    SUBJECTIVE: The patient is doing well today.  At this time, she denies chest pain, shortness of breath, or any new concerns.  Doug Sou Hold] apixaban  5 mg Oral BID  . [MAR Hold] benazepril  40 mg Oral Daily  . [MAR Hold] dofetilide  375 mcg Oral BID  . [MAR Hold] magnesium oxide  400 mg Oral Daily  . [MAR Hold] metoprolol tartrate  37.5 mg Oral BID  . [MAR Hold] multivitamin  1 tablet Oral Daily  . [MAR Hold] omega-3 acid ethyl esters  1 g Oral BID  . [MAR Hold] sodium chloride flush  3 mL Intravenous Q12H  . [MAR Hold] sodium chloride flush  3 mL Intravenous Q12H  . [MAR Hold] triamcinolone cream  1 application Topical Daily   . sodium chloride    . sodium chloride 20 mL/hr at 04/29/16 0933    OBJECTIVE: Physical Exam: Vitals:   04/28/16 2043 04/29/16 0543 04/29/16 0911 04/29/16 0930  BP: (!) 120/59 120/67 119/60 (!) 164/92  Pulse: 79 82 100 97  Resp: 18 18 14 20   Temp: 97.9 F (36.6 C) 97.5 F (36.4 C) 98 F (36.7 C) 97.7 F (36.5 C)  TempSrc: Oral Oral Oral Oral  SpO2: 97% 97% 96% 97%  Weight:    267 lb 9.6 oz (121.4 kg)  Height:    5\' 8"  (1.727 m)    Intake/Output Summary (Last 24 hours) at 04/29/16 0950 Last data filed at 04/29/16 0946  Gross per 24 hour  Intake              820 ml  Output              200 ml  Net              620 ml    Telemetry is reviewed by myself, currently AFib 90's, RVR noted intermittently, no V arrhythmias  GEN- The patient is well appearing, alert and oriented x 3 today.   Head- normocephalic, atraumatic Eyes-  Sclera clear, conjunctiva pink Ears- hearing intact Oropharynx- clear Neck- supple, no JVP Lungs- CTA b/l, normal work of breathing Heart- IRRR, no significant murmurs, no rubs or gallops GI- soft, NT, ND Extremities- no clubbing, cyanosis, or edema Skin- no rash or lesion Psych- euthymic mood, full affect Neuro- no gross deficits appreciated  LABS: Basic Metabolic Panel:  Recent Labs  04/28/16 0248  04/29/16 0226  NA 136 137  K 4.1 4.3  CL 99* 102  CO2 26 25  GLUCOSE 182* 159*  BUN 25* 22*  CREATININE 0.95 0.82  CALCIUM 9.4 9.2  MG 1.7 2.1    ASSESSMENT AND PLAN:   1. Persistent AFib, Tikosyn initiation protocol     CHA2DS2Vasc is 3, on Eliquis     EKG was reviewed with Dr. [name], QTc prolonged, will reduce dose to 366mcg this morning     K+ 4.3     Mag 2.1     Creat 0.82, stable     DCCV scheduled for this morning  Anticipate discharge tomorrow       2. HTN     No changes  3  SOB new   Tommye Standard, PA-C 04/29/2016 9:50 AM  Pt seen and examined  QT alittle long but hard to read with the flutter For cardioversion today  Not sure what caused the SOB but may be relaetd to relatively fast HR   Will reassess after cardioversion

## 2016-04-30 LAB — BASIC METABOLIC PANEL
ANION GAP: 11 (ref 5–15)
BUN: 18 mg/dL (ref 6–20)
CHLORIDE: 101 mmol/L (ref 101–111)
CO2: 23 mmol/L (ref 22–32)
Calcium: 9.1 mg/dL (ref 8.9–10.3)
Creatinine, Ser: 0.79 mg/dL (ref 0.44–1.00)
Glucose, Bld: 183 mg/dL — ABNORMAL HIGH (ref 65–99)
POTASSIUM: 3.9 mmol/L (ref 3.5–5.1)
SODIUM: 135 mmol/L (ref 135–145)

## 2016-04-30 LAB — MAGNESIUM: MAGNESIUM: 1.9 mg/dL (ref 1.7–2.4)

## 2016-04-30 MED ORDER — POTASSIUM CHLORIDE CRYS ER 20 MEQ PO TBCR
20.0000 meq | EXTENDED_RELEASE_TABLET | Freq: Once | ORAL | Status: AC
  Administered 2016-04-30: 20 meq via ORAL
  Filled 2016-04-30: qty 1

## 2016-04-30 MED ORDER — MAGNESIUM OXIDE 400 (241.3 MG) MG PO TABS: 400.0000 mg | ORAL_TABLET | Freq: Every day | ORAL | 3 refills | Status: DC

## 2016-04-30 MED ORDER — DOFETILIDE 125 MCG PO CAPS: 375.0000 ug | ORAL_CAPSULE | Freq: Two times a day (BID) | ORAL | 3 refills | Status: DC

## 2016-04-30 NOTE — Discharge Summary (Signed)
ELECTROPHYSIOLOGY PROCEDURE DISCHARGE SUMMARY    Patient ID: Dominique Bauer,  MRN: [id number], DOB/AGE: 06/29/1945 71 y.o.  Admit date: 04/27/2016 Discharge date: 04/30/2016  Primary Care Physician: [name], MD Primary Cardiologist: Johnsie Cancel Electrophysiologist: [name]  Primary Discharge Diagnosis:  1.  Persistent atrial fibrillation status post Tikosyn loading this admission  Secondary Discharge Diagnosis:  1.  Hypertension 2.  Obesity 3.  OSA  Allergies  Allergen Reactions  . Penicillins Hives  . Neosporin [Neomycin-Bacitracin Zn-Polymyx] Rash     Procedures This Admission:  1.  Tikosyn loading 2.  Direct current cardioversion on 04/29/16 by Dr Sallyanne Kuster which successfully restored SR.  There were no early apparent complications.   Brief HPI: Dominique Bauer is a 71 y.o. female with a past medical history as noted above. She failed DCCV off AAD therapy and also has failed Flecainide. Risks, benefits, and alternatives to Tikosyn were reviewed with the patient who wished to proceed.    Hospital Course:  The patient was admitted and Tikosyn was initiated.  Renal function and electrolytes were followed during the hospitalization.  Their QTc remained stable on 382mcg of Tikosyn.  On 04/29/16 they underwent direct current cardioversion which restored sinus rhythm.  They were monitored until discharge on telemetry which demonstrated sinus rhythm.  On the day of discharge, they were examined by Dr [name] who considered them stable for discharge to home.  Follow-up has been arranged with AF clinic in 1 week and with Dr [name] in 4 weeks.   Physical Exam: Vitals:   04/29/16 1000 04/29/16 1325 04/29/16 2055 04/30/16 0502  BP: (!) 152/77 (!) 93/51 119/62 116/62  Pulse: 82 73 74 70  Resp: 19 17 18 17   Temp:  98.1 F (36.7 C) 98.1 F (36.7 C) 97.8 F (36.6 C)  TempSrc:  Oral Oral Oral  SpO2: 99% 98% 99% 97%  Weight:      Height:        GEN- The patient is well appearing,  alert and oriented x 3 today.   HEENT: normocephalic, atraumatic; sclera clear, conjunctiva pink; hearing intact; oropharynx clear; neck supple  Lungs- Clear to ausculation bilaterally, normal work of breathing.  No wheezes, rales, rhonchi Heart- Regular rate and rhythm  GI- soft, non-tender, non-distended, bowel sounds present  Extremities- no clubbing, cyanosis,  No LE edema MS- no significant deformity or atrophy Skin- warm and dry, no rash or lesion Psych- euthymic mood, full affect Neuro- strength and sensation are intact   Labs:   Lab Results  Component Value Date   WBC 9.3 01/05/2016   HGB 13.7 01/05/2016   HCT 40.8 01/05/2016   MCV 89.7 01/05/2016   PLT 277 01/05/2016     Recent Labs Lab 04/30/16 0207  NA 135  K 3.9  CL 101  CO2 23  BUN 18  CREATININE 0.79  CALCIUM 9.1  GLUCOSE 183*     Discharge Medications:  Allergies as of 04/30/2016      Reactions   Penicillins Hives   Neosporin [neomycin-bacitracin Zn-polymyx] Rash      Medication List    TAKE these medications   acetaminophen 650 MG CR tablet Commonly known as:  TYLENOL Take 650 mg by mouth every 8 (eight) hours as needed for pain (arthritis).   albuterol 108 (90 Base) MCG/ACT inhaler Commonly known as:  PROVENTIL HFA;VENTOLIN HFA Inhale 2 puffs into the lungs every 6 (six) hours as needed for wheezing or shortness of breath.   benazepril 40  MG tablet Commonly known as:  LOTENSIN Take 40 mg by mouth daily.   CALCIUM 600 1500 (600 Ca) MG Tabs tablet Generic drug:  calcium carbonate Take 1,500 mg by mouth daily with breakfast.   calcium carbonate 500 MG chewable tablet Commonly known as:  TUMS - dosed in mg elemental calcium Chew 1 tablet by mouth daily as needed for indigestion or heartburn.   dofetilide 125 MCG capsule Commonly known as:  TIKOSYN Take 3 capsules (375 mcg total) by mouth 2 (two) times daily.   ELIQUIS 5 MG Tabs tablet Generic drug:  apixaban Take 5 mg by mouth 2  (two) times daily.   Fish Oil 1000 MG Caps Take 1,000 mg by mouth daily.   L-LYSINE PO TAKE ONE TABLET BY MOUTH DAILY   magnesium oxide 400 (241.3 Mg) MG tablet Commonly known as:  MAG-OX Take 1 tablet (400 mg total) by mouth daily.   metoprolol tartrate 25 MG tablet Commonly known as:  LOPRESSOR Take 1.5 tablets (37.5 mg total) by mouth 2 (two) times daily.   multivitamin-lutein Caps capsule Take 1 capsule by mouth daily.   triamcinolone cream 0.1 % Commonly known as:  KENALOG Apply 1 application topically daily.       Disposition:  Discharge Instructions    Diet - low sodium heart healthy    Complete by:  As directed    Increase activity slowly    Complete by:  As directed      Follow-up Information    [facility] Follow up on 05/07/2016.   Specialty:  Cardiology Why:  3:30PM Contact information: [address] [id number] mc [place]       Virl Axe, MD Follow up on 05/27/2016.   Specialty:  Cardiology Why:  1:30PM Contact information: [address] [place] [address] [phone]           Duration of Discharge Encounter: Greater than 30 minutes including physician time.  Signed, Chanetta Marshall, NP 04/30/2016 9:12 AM  Opt seen Telemetry Personally reviewed  Without VT. ECG personally reviewed QT 470 msec  Will followup in Afib clinic and will need BMET

## 2016-05-04 ENCOUNTER — Encounter (HOSPITAL_COMMUNITY): Payer: Self-pay | Admitting: Cardiovascular Disease

## 2016-05-05 ENCOUNTER — Ambulatory Visit (HOSPITAL_BASED_OUTPATIENT_CLINIC_OR_DEPARTMENT_OTHER): Payer: Medicare Other | Attending: Cardiovascular Disease | Admitting: Cardiovascular Disease

## 2016-05-05 VITALS — Ht 68.0 in | Wt 265.0 lb

## 2016-05-05 DIAGNOSIS — G4730 Sleep apnea, unspecified: Secondary | ICD-10-CM | POA: Diagnosis present

## 2016-05-05 DIAGNOSIS — I973 Postprocedural hypertension: Secondary | ICD-10-CM | POA: Diagnosis not present

## 2016-05-05 DIAGNOSIS — M9904 Segmental and somatic dysfunction of sacral region: Secondary | ICD-10-CM | POA: Diagnosis not present

## 2016-05-05 DIAGNOSIS — Z7901 Long term (current) use of anticoagulants: Secondary | ICD-10-CM | POA: Diagnosis not present

## 2016-05-05 DIAGNOSIS — Z79899 Other long term (current) drug therapy: Secondary | ICD-10-CM | POA: Insufficient documentation

## 2016-05-05 DIAGNOSIS — M9903 Segmental and somatic dysfunction of lumbar region: Secondary | ICD-10-CM | POA: Diagnosis not present

## 2016-05-05 DIAGNOSIS — R0683 Snoring: Secondary | ICD-10-CM | POA: Insufficient documentation

## 2016-05-05 DIAGNOSIS — G4733 Obstructive sleep apnea (adult) (pediatric): Secondary | ICD-10-CM | POA: Diagnosis not present

## 2016-05-05 DIAGNOSIS — M5430 Sciatica, unspecified side: Secondary | ICD-10-CM | POA: Diagnosis not present

## 2016-05-05 DIAGNOSIS — M9902 Segmental and somatic dysfunction of thoracic region: Secondary | ICD-10-CM | POA: Diagnosis not present

## 2016-05-07 ENCOUNTER — Ambulatory Visit (HOSPITAL_COMMUNITY)
Admission: RE | Admit: 2016-05-07 | Discharge: 2016-05-07 | Disposition: A | Discharge status: Home / Self Care | Payer: Medicare Other | Source: Ambulatory Visit | Attending: Nurse Practitioner | Admitting: Nurse Practitioner

## 2016-05-07 ENCOUNTER — Encounter (HOSPITAL_COMMUNITY): Payer: Self-pay | Admitting: Nurse Practitioner

## 2016-05-07 VITALS — BP 110/64 | HR 61 | Ht 68.0 in | Wt 265.0 lb

## 2016-05-07 DIAGNOSIS — E669 Obesity, unspecified: Secondary | ICD-10-CM | POA: Insufficient documentation

## 2016-05-07 DIAGNOSIS — I444 Left anterior fascicular block: Secondary | ICD-10-CM | POA: Diagnosis not present

## 2016-05-07 DIAGNOSIS — Z7901 Long term (current) use of anticoagulants: Secondary | ICD-10-CM | POA: Insufficient documentation

## 2016-05-07 DIAGNOSIS — Z79899 Other long term (current) drug therapy: Secondary | ICD-10-CM | POA: Insufficient documentation

## 2016-05-07 DIAGNOSIS — I481 Persistent atrial fibrillation: Secondary | ICD-10-CM | POA: Insufficient documentation

## 2016-05-07 DIAGNOSIS — Z6841 Body Mass Index (BMI) 40.0 and over, adult: Secondary | ICD-10-CM | POA: Insufficient documentation

## 2016-05-07 DIAGNOSIS — I4819 Other persistent atrial fibrillation: Secondary | ICD-10-CM

## 2016-05-07 DIAGNOSIS — G4733 Obstructive sleep apnea (adult) (pediatric): Secondary | ICD-10-CM | POA: Diagnosis not present

## 2016-05-07 DIAGNOSIS — I4891 Unspecified atrial fibrillation: Secondary | ICD-10-CM | POA: Diagnosis present

## 2016-05-07 LAB — BASIC METABOLIC PANEL
Anion gap: 8 (ref 5–15)
BUN: 18 mg/dL (ref 6–20)
CHLORIDE: 102 mmol/L (ref 101–111)
CO2: 26 mmol/L (ref 22–32)
CREATININE: 0.72 mg/dL (ref 0.44–1.00)
Calcium: 10.7 mg/dL — ABNORMAL HIGH (ref 8.9–10.3)
GFR calc non Af Amer: >60 mL/min (ref >60)
Glucose, Bld: 130 mg/dL — ABNORMAL HIGH (ref 65–99)
POTASSIUM: 4.6 mmol/L (ref 3.5–5.1)
Sodium: 136 mmol/L (ref 135–145)

## 2016-05-07 LAB — MAGNESIUM: Magnesium: 1.9 mg/dL (ref 1.7–2.4)

## 2016-05-07 NOTE — Progress Notes (Signed)
PCP: [name], MD Primary Cardiologist:  Dr Johnsie Cancel Primary EP:  Dr Elmer Sow Dominique Bauer is a 71 y.o. female who presents today for routine electrophysiology followup.  Since her recent initiation of tikosyn, the patient reports doing very well.  She feels "much better" in sinus rhythm.  Her diaphoresis is nearly resolved and here exercise tolerance improved. Today, she denies symptoms of palpitations, chest pain, shortness of breath,  lower extremity edema, dizziness, presyncope, or syncope.  The patient is otherwise without complaint today.   Past Medical History:  Diagnosis Date  . Body mass index 40.0-44.9, adult ([place])   . H/O measles   . H/O mumps   . H/O: whooping cough   . History of chicken pox   . Hypertension   . Persistent atrial fibrillation ([place])   . Sciatica   . Sleep apnea    not using aything at night right now/LH   Past Surgical History:  Procedure Laterality Date  . ABDOMINAL HYSTERECTOMY    . BREAST BIOPSY    . CARDIOVERSION N/A 11/17/2015   Procedure: CARDIOVERSION;  Surgeon: [name], MD;  Location: [facility] ENDOSCOPY;  Service: Cardiovascular;  Laterality: N/A;  . CARDIOVERSION N/A 01/07/2016   Procedure: CARDIOVERSION;  Surgeon: [name], MD;  Location: [place];  Service: Cardiovascular;  Laterality: N/A;  . CARDIOVERSION N/A 04/29/2016   Procedure: CARDIOVERSION;  Surgeon: Sanda Klein, MD;  Location: [facility] ENDOSCOPY;  Service: Cardiovascular;  Laterality: N/A;  . DG  BONE DENSITY ([place] HX)    . INNER EAR SURGERY    . NASAL SEPTUM SURGERY      ROS- all systems are reviewed and negatives except as per HPI above  Current Outpatient Prescriptions  Medication Sig Dispense Refill  . acetaminophen (TYLENOL) 650 MG CR tablet Take 650 mg by mouth every 8 (eight) hours as needed for pain (arthritis).    Marland Kitchen albuterol (PROVENTIL HFA;VENTOLIN HFA) 108 (90 Base) MCG/ACT inhaler Inhale 2 puffs into the lungs every 6 (six) hours as needed for  wheezing or shortness of breath.    Marland Kitchen apixaban (ELIQUIS) 5 MG TABS tablet Take 5 mg by mouth 2 (two) times daily.    . benazepril (LOTENSIN) 40 MG tablet Take 40 mg by mouth daily.    . calcium carbonate (CALCIUM 600) 1500 (600 Ca) MG TABS tablet Take 1,500 mg by mouth daily with breakfast.    . calcium carbonate (TUMS - DOSED IN MG ELEMENTAL CALCIUM) 500 MG chewable tablet Chew 1 tablet by mouth daily as needed for indigestion or heartburn.    . cholecalciferol (VITAMIN D) 1000 units tablet Take 1,000 Units by mouth daily.    Marland Kitchen dofetilide (TIKOSYN) 125 MCG capsule Take 3 capsules (375 mcg total) by mouth 2 (two) times daily. 540 capsule 3  . L-LYSINE PO TAKE ONE TABLET BY MOUTH DAILY    . magnesium oxide (MAG-OX) 400 (241.3 Mg) MG tablet Take 1 tablet (400 mg total) by mouth daily. 180 tablet 3  . metoprolol tartrate (LOPRESSOR) 25 MG tablet Take 1.5 tablets (37.5 mg total) by mouth 2 (two) times daily. 270 tablet 3  . multivitamin-lutein (OCUVITE-LUTEIN) CAPS capsule Take 1 capsule by mouth daily.    . Omega-3 Fatty Acids (FISH OIL) 1000 MG CAPS Take 1,000 mg by mouth daily.    Marland Kitchen triamcinolone cream (KENALOG) 0.1 % Apply 1 application topically daily.    Marland Kitchen zinc gluconate 50 MG tablet Take 50 mg by mouth  daily.     No current facility-administered medications for this encounter.     Physical Exam: Vitals:   05/07/16 1553  BP: 110/64  Pulse: 61  Weight: 265 lb (120.2 kg)  Height: 5\' 8"  (1.727 m)    GEN- The patient is well appearing, alert and oriented x 3 today.   Head- normocephalic, atraumatic Eyes-  Sclera clear, conjunctiva pink Ears- hearing intact Oropharynx- clear Lungs- Clear to ausculation bilaterally, normal work of breathing Heart- Regular rate and rhythm, no murmurs, rubs or gallops, PMI not laterally displaced GI- soft, NT, ND, + BS Extremities- no clubbing, cyanosis, or edema  ekg today reveals sinus rhythm 61 bpm, Qtc 459 msec  Assessment and Plan:  1.  Persistent atrial fibrillation Doing well with tikosyn qtc is stable Check bmet, mg today Continue eliquis long term The importance of regular exercise and weight reduction were discussed at length today. She does not seem particularly motivated with this.  I therefore worry that her afib will return at some point.  2. Obesity Body mass index is 40.29 kg/m. Weight reduction encouraged  3. OSA She just had CPAP trial and anticipates starting CPAP soon  Follow-up with Drs [name] and Johnsie Cancel as scheduled Will need bmet, mg, and ekg through Dr Olin Pia office every 3 months. Return to the af clinic as needed.  Thompson Grayer MD, [facility] 05/07/2016 4:16 PM

## 2016-05-09 NOTE — Procedures (Signed)
Patient Name: Dominique Bauer, Dominique Bauer Date: 05/05/2016 Gender: Female D.O.B: 1945-10-12 Age (years): 51 Referring Provider: Jenkins Rouge Height (inches): 68 Interpreting Physician: Shelva Majestic MD, ABSM Weight (lbs): 266 RPSGT: Gerhard Perches BMI: 40 MRN: [id number] Neck Size: 18.00  CLINICAL INFORMATION The patient is referred for a CPAP titration to treat sleep apnea.  Date of NPSG:  02/15/2016:  AHI 31.2/h overall;  AHI during REM sleep 58.3/h;  AHI with supine sleep 46.1/h;  O2 desaturation to 69%.  SLEEP STUDY TECHNIQUE As per the AASM Manual for the Scoring of Sleep and Associated Events v2.3 (April 2016) with a hypopnea requiring 4% desaturations.  The channels recorded and monitored were frontal, central and occipital EEG, electrooculogram (EOG), submentalis EMG (chin), nasal and oral airflow, thoracic and abdominal wall motion, anterior tibialis EMG, snore microphone, electrocardiogram, and pulse oximetry. Continuous positive airway pressure (CPAP) was initiated at the beginning of the study and titrated to treat sleep-disordered breathing.  MEDICATIONS acetaminophen (TYLENOL) 650 MG CR tablet albuterol (PROVENTIL HFA;VENTOLIN HFA) 108 (90 Base) MCG/ACT inhaler apixaban (ELIQUIS) 5 MG TABS tablet benazepril (LOTENSIN) 40 MG tablet calcium carbonate (CALCIUM 600) 1500 (600 Ca) MG TABS tablet calcium carbonate (TUMS - DOSED IN MG ELEMENTAL CALCIUM) 500 MG chewable tablet cholecalciferol (VITAMIN D) 1000 units tablet dofetilide (TIKOSYN) 125 MCG capsule L-LYSINE PO magnesium oxide (MAG-OX) 400 (241.3 Mg) MG tablet metoprolol tartrate (LOPRESSOR) 25 MG tablet (Expired) multivitamin-lutein (OCUVITE-LUTEIN) CAPS capsule Omega-3 Fatty Acids (FISH OIL) 1000 MG CAPS triamcinolone cream (KENALOG) 0.1 % zinc gluconate 50 MG tablet  Medications self-administered by patient taken the night of the study : eliquis, METOPROLOL TARTRATE, TUMS, TIKOSYN,  L-LYSINE  TECHNICIAN COMMENTS Comments added by technician: Patient had difficulty initiating sleep.   RESPIRATORY PARAMETERS Optimal PAP Pressure (cm): 13 AHI at Optimal Pressure (/hr): 1.6 Overall Minimal O2 (%): 90.00 Supine % at Optimal Pressure (%): 0 Minimal O2 at Optimal Pressure (%): 92.0    SLEEP ARCHITECTURE The study was initiated at 10:54:43 PM and ended at 4:54:42 AM.  Sleep onset time was 21.1 minutes and the sleep efficiency was 37.5%. The total sleep time was 135.0 minutes. Wake after sleep onset (WASO) was 203.9 minutes.  The patient spent 48.89% of the night in stage N1 sleep, 51.11% in stage N2 sleep, 0.00% in stage N3 and 0.00% in REM.Stage REM latency was N/A minutes.  Wake after sleep onset was 203.9. Alpha intrusion was absent. Supine sleep was 25.19%.  CARDIAC DATA The 2 lead EKG demonstrated sinus rhythm. The mean heart rate was 61.01 beats per minute. Other EKG findings include: None.  LEG MOVEMENT DATA The total Periodic Limb Movements of Sleep (PLMS) were 0. The PLMS index was 0.00. A PLMS index of <15 is considered normal in adults.  IMPRESSIONS - The patient was titrated on CPAP from 5 cm to 13 cm water pressure. AHI was 1.6/h at 13 cm. - Central sleep apnea was not noted during this titration (CAI = 2.7/h). - Absence of REM sleep on this titration study.  - Reduced sleep efficiency. - No significant oxygen desaturations with CPAP to a nadir of 90.00% at 8 cm pressure. - The patient snored with Soft snoring volume during this titration study. - No cardiac abnormalities were observed during this study. - Clinically significant periodic limb movements were not noted during this study. Arousals associated with PLMs were rare.  DIAGNOSIS - Obstructive Sleep Apnea (327.23 [G47.33 ICD-10])  RECOMMENDATIONS - Since the patient was previously noted to have more  severe OSA during REM sleep and there was absence of REM sleep on this titration study,  recommend an initial trial of CPAP Auto with EPR of 3 at 10 - 18 cm water pressure with heated humidification.  A small size Fisher&Paykel Full Face Mask Simplus mask was used for the titration. - Efforts should be made to optimize nasal and oropharyngeal patency.  - Consider a sleep aid PRN for improved sleep initiation and maintenance. - Recommend the patient be counseled to avoid supine sleep. - Avoid alcohol, sedatives and other CNS depressants that may worsen sleep apnea and disrupt normal sleep architecture. - Sleep hygiene should be reviewed to assess factors that may improve sleep quality. - Weight management and regular exercise should be initiated or continued. - Recommend a download be obtined in 30 days and sleep clinic evaluation after 4 weeks of therapy   [Electronically signed] 05/09/2016 10:19 AM  Shelva Majestic MD, [facility], ABSM Diplomate, American Board of Sleep Medicine   NPI: [id number] [place] PH: [phone]   FX: [phone] [place]

## 2016-05-11 DIAGNOSIS — I10 Essential (primary) hypertension: Secondary | ICD-10-CM | POA: Diagnosis not present

## 2016-05-11 DIAGNOSIS — Z1389 Encounter for screening for other disorder: Secondary | ICD-10-CM | POA: Diagnosis not present

## 2016-05-11 DIAGNOSIS — I480 Paroxysmal atrial fibrillation: Secondary | ICD-10-CM | POA: Diagnosis not present

## 2016-05-11 DIAGNOSIS — Z0000 Encounter for general adult medical examination without abnormal findings: Secondary | ICD-10-CM | POA: Diagnosis not present

## 2016-05-12 ENCOUNTER — Telehealth: Payer: Self-pay | Admitting: *Deleted

## 2016-05-12 ENCOUNTER — Other Ambulatory Visit: Payer: Self-pay | Admitting: *Deleted

## 2016-05-12 DIAGNOSIS — G4733 Obstructive sleep apnea (adult) (pediatric): Secondary | ICD-10-CM

## 2016-05-12 DIAGNOSIS — I10 Essential (primary) hypertension: Secondary | ICD-10-CM

## 2016-05-12 DIAGNOSIS — I4819 Other persistent atrial fibrillation: Secondary | ICD-10-CM

## 2016-05-12 NOTE — Progress Notes (Signed)
CPAP set up referral sent to [facility].

## 2016-05-12 NOTE — Telephone Encounter (Signed)
CPAP referral sent to  [facility].

## 2016-05-19 DIAGNOSIS — M9902 Segmental and somatic dysfunction of thoracic region: Secondary | ICD-10-CM | POA: Diagnosis not present

## 2016-05-19 DIAGNOSIS — M9903 Segmental and somatic dysfunction of lumbar region: Secondary | ICD-10-CM | POA: Diagnosis not present

## 2016-05-19 DIAGNOSIS — M9904 Segmental and somatic dysfunction of sacral region: Secondary | ICD-10-CM | POA: Diagnosis not present

## 2016-05-19 DIAGNOSIS — M5430 Sciatica, unspecified side: Secondary | ICD-10-CM | POA: Diagnosis not present

## 2016-05-19 DIAGNOSIS — I973 Postprocedural hypertension: Secondary | ICD-10-CM | POA: Diagnosis not present

## 2016-05-20 DIAGNOSIS — G4733 Obstructive sleep apnea (adult) (pediatric): Secondary | ICD-10-CM | POA: Diagnosis not present

## 2016-05-21 ENCOUNTER — Telehealth: Payer: Self-pay | Admitting: Cardiovascular Disease

## 2016-05-21 NOTE — Telephone Encounter (Signed)
New message    Pt is calling about her CPAP machine she received yesterday. She used it last night and states that she had to take it off multiple times throughout the night. She said when it would start blowing hard is when she would remove it. She is wondering if the strength could be decreased. She request a call from Rn.

## 2016-05-21 NOTE — Telephone Encounter (Signed)
Spoke with pt, aware she will need to call [facility] @ [phone] and let them know what is going on. They will let us know if there is anything we need to do from our end. Patient voiced understanding.

## 2016-05-27 ENCOUNTER — Encounter: Payer: Self-pay | Admitting: Internal Medicine

## 2016-05-27 ENCOUNTER — Ambulatory Visit (INDEPENDENT_AMBULATORY_CARE_PROVIDER_SITE_OTHER): Payer: Medicare Other | Admitting: Internal Medicine

## 2016-05-27 VITALS — BP 132/70 | HR 63 | Ht 68.0 in | Wt 265.0 lb

## 2016-05-27 DIAGNOSIS — I4819 Other persistent atrial fibrillation: Secondary | ICD-10-CM

## 2016-05-27 DIAGNOSIS — I481 Persistent atrial fibrillation: Secondary | ICD-10-CM

## 2016-05-27 NOTE — Progress Notes (Signed)
Patient Care Team: [name], MD as PCP - General (Internal Medicine)   HPI  Dominique Bauer is a 71 y.o. female Seen in follow-up for atrial fibrillation for which she was admitted for dofetilide initiation 1/18. She was seen in the A. fib clinic 2/18 feeling much better in sinus rhythm.  She has a history of persistent long-standing persistent atrial fibrillation for which cardioversion with dofetilide surprisingly effective. LV function was normal;   left atrial size is only modestly enlarged at 43  Date Cr K Mg  2/18  0.72 4.6 1.9   She has sleep apnea and is struggling to adjust to CPAP   She is much better with sinus than afib    Past Medical History:  Diagnosis Date  . Body mass index 40.0-44.9, adult ([place])   . H/O measles   . H/O mumps   . H/O: whooping cough   . History of chicken pox   . Hypertension   . Persistent atrial fibrillation ([place])   . Sciatica   . Sleep apnea    not using aything at night right now/LH    Past Surgical History:  Procedure Laterality Date  . ABDOMINAL HYSTERECTOMY    . BREAST BIOPSY    . CARDIOVERSION N/A 11/17/2015   Procedure: CARDIOVERSION;  Surgeon: [name], MD;  Location[facility] ENDOSCOPY;  Service: Cardiovascular;  Laterality: N/A;  . CARDIOVERSION N/A 01/07/2016   Procedure: CARDIOVERSION;  Surgeon: [name], MD;  Location: [place];  Service: Cardiovascular;  Laterality: N/A;  . CARDIOVERSION N/A 04/29/2016   Procedure: CARDIOVERSION;  Surgeon: Sanda Jeramie Scogin, MD;  Location: [facility] ENDOSCOPY;  Service: Cardiovascular;  Laterality: N/A;  . DG  BONE DENSITY ([place] HX)    . INNER EAR SURGERY    . NASAL SEPTUM SURGERY      Current Outpatient Prescriptions  Medication Sig Dispense Refill  . acetaminophen (TYLENOL) 650 MG CR tablet Take 650 mg by mouth every 8 (eight) hours as needed for pain (arthritis).    Marland Kitchen albuterol (PROVENTIL HFA;VENTOLIN HFA) 108 (90 Base) MCG/ACT inhaler Inhale 2 puffs into the lungs  every 6 (six) hours as needed for wheezing or shortness of breath.    Marland Kitchen apixaban (ELIQUIS) 5 MG TABS tablet Take 5 mg by mouth 2 (two) times daily.    . benazepril (LOTENSIN) 40 MG tablet Take 40 mg by mouth daily.    . calcium carbonate (CALCIUM 600) 1500 (600 Ca) MG TABS tablet Take 1,500 mg by mouth daily with breakfast.    . calcium carbonate (TUMS - DOSED IN MG ELEMENTAL CALCIUM) 500 MG chewable tablet Chew 1 tablet by mouth daily as needed for indigestion or heartburn.    . cholecalciferol (VITAMIN D) 1000 units tablet Take 1,000 Units by mouth daily.    Marland Kitchen dofetilide (TIKOSYN) 125 MCG capsule Take 3 capsules (375 mcg total) by mouth 2 (two) times daily. 540 capsule 3  . L-LYSINE PO TAKE ONE TABLET BY MOUTH DAILY    . magnesium oxide (MAG-OX) 400 (241.3 Mg) MG tablet Take 1 tablet (400 mg total) by mouth daily. 180 tablet 3  . metoprolol tartrate (LOPRESSOR) 25 MG tablet Take 1.5 tablets (37.5 mg total) by mouth 2 (two) times daily. 270 tablet 3  . multivitamin-lutein (OCUVITE-LUTEIN) CAPS capsule Take 1 capsule by mouth daily.    . Omega-3 Fatty Acids (FISH OIL) 1000 MG CAPS Take 1,000 mg by mouth daily.    Marland Kitchen triamcinolone cream (KENALOG)  0.1 % Apply 1 application topically daily.    Marland Kitchen zinc gluconate 50 MG tablet Take 50 mg by mouth daily.     No current facility-administered medications for this visit.     Allergies  Allergen Reactions  . Penicillins Hives  . Neosporin [Neomycin-Bacitracin Zn-Polymyx] Rash      Review of Systems negative except from HPI and PMH  Physical Exam BP 132/70   Pulse 63   Ht 5\' 8"  (1.727 m)   Wt 265 lb (120.2 kg)   SpO2 98%   BMI 40.29 kg/m  Well developed and well nourished in no acute distress HENT normal E scleral and icterus clear Neck Supple JVP flat; carotids brisk and full Clear to ausculation Regular rate and rhythm, no murmurs gallops or rub Soft with active bowel sounds No clubbing cyanosis  Edema Alert and oriented, grossly  normal motor and sensory function Skin Warm and Dry  ECG personally reviewed Sinus 56 20/09/44 LAD   Assessment and  Plan Afib persistent  OSA  Hypertension  High Risk Medication Surveillance   BP reasonably controlled  No intercurrent atrial fibrillation or flutter  On Anticoagulation;  No bleeding issues   Communicated  with DR TK re OSA and CPAP intolerance        Current medicines are reviewed at length with the patient today .  The patient does not  have concerns regarding medicines.

## 2016-05-27 NOTE — Patient Instructions (Addendum)
Medication Instructions:    Your physician recommends that you continue on your current medications as directed. Please refer to the Current Medication list given to you today.  --- If you need a refill on your cardiac medications before your next appointment, please call your pharmacy. ---  Labwork:  None ordered  Testing/Procedures:  None ordered  Follow-Up:  Your physician recommends that you schedule a follow-up appointment in: 3 months with Roderic Palau, NP in the AFib clinic.   Your physician wants you to follow-up in: 6 months with Dr. [name].  You will receive a reminder letter in the mail two months in advance. If you don't receive a letter, please call our office to schedule the follow-up appointment.   Thank you for choosing [facility]!!

## 2016-06-09 DIAGNOSIS — M5030 Other cervical disc degeneration, unspecified cervical region: Secondary | ICD-10-CM | POA: Diagnosis not present

## 2016-06-09 DIAGNOSIS — M9901 Segmental and somatic dysfunction of cervical region: Secondary | ICD-10-CM | POA: Diagnosis not present

## 2016-06-09 DIAGNOSIS — M9904 Segmental and somatic dysfunction of sacral region: Secondary | ICD-10-CM | POA: Diagnosis not present

## 2016-06-09 DIAGNOSIS — M9903 Segmental and somatic dysfunction of lumbar region: Secondary | ICD-10-CM | POA: Diagnosis not present

## 2016-06-09 DIAGNOSIS — M9902 Segmental and somatic dysfunction of thoracic region: Secondary | ICD-10-CM | POA: Diagnosis not present

## 2016-06-10 ENCOUNTER — Other Ambulatory Visit: Payer: Self-pay | Admitting: Internal Medicine

## 2016-06-10 DIAGNOSIS — Z1231 Encounter for screening mammogram for malignant neoplasm of breast: Secondary | ICD-10-CM

## 2016-06-11 DIAGNOSIS — G4733 Obstructive sleep apnea (adult) (pediatric): Secondary | ICD-10-CM | POA: Diagnosis not present

## 2016-06-14 ENCOUNTER — Telehealth: Payer: Self-pay | Admitting: Cardiovascular Disease

## 2016-06-14 NOTE — Telephone Encounter (Signed)
SPOKE WITH  PT  IN GREAT  LENGTH   HAS  NOT  BEEN  ABLE  TO  TOLERATE   THE  CPAP  EQUIP CAN  ONLY  WEAR FOR   ABOUT AN HOUR AND  IS  AWAKENED , CANNOT  TOLERATE  THE  PRESSURE ,  CANT BREATH, EARS POP AND  HURT  SETTING  IS   AT  10-18  [place].  PT  REMOVES MASK  WHEN  REACHES  15 WILL FORWARD  TO  DR Claiborne Billings  FOR  REVIEW .Adonis Housekeeper

## 2016-06-14 NOTE — Telephone Encounter (Signed)
Please call,she is having a problem and need to talk to [place] or somebody please.

## 2016-06-15 ENCOUNTER — Other Ambulatory Visit: Payer: Self-pay | Admitting: *Deleted

## 2016-06-15 ENCOUNTER — Telehealth: Payer: Self-pay | Admitting: *Deleted

## 2016-06-15 DIAGNOSIS — Z9989 Dependence on other enabling machines and devices: Principal | ICD-10-CM

## 2016-06-15 DIAGNOSIS — G4733 Obstructive sleep apnea (adult) (pediatric): Secondary | ICD-10-CM

## 2016-06-15 NOTE — Telephone Encounter (Signed)
Patient called and notified that Dr Claiborne Billings made a minor pressure adjustment.

## 2016-06-15 NOTE — Telephone Encounter (Signed)
Patient is on a CPAP auto unit.  Her pressure would not increase to that level if she did not need it.  reduce the maximum pressure obtainable to 16.

## 2016-06-15 NOTE — Telephone Encounter (Signed)
Called to notify patient that Dr Claiborne Billings decreased the pressure on her CPAP machine. She is to call us back again if this doesn't work to alleviate her symptoms. Patient voiced her understanding.

## 2016-06-17 DIAGNOSIS — G4733 Obstructive sleep apnea (adult) (pediatric): Secondary | ICD-10-CM | POA: Diagnosis not present

## 2016-06-23 ENCOUNTER — Telehealth: Payer: Self-pay | Admitting: Cardiovascular Disease

## 2016-06-23 DIAGNOSIS — M9903 Segmental and somatic dysfunction of lumbar region: Secondary | ICD-10-CM | POA: Diagnosis not present

## 2016-06-23 DIAGNOSIS — M5030 Other cervical disc degeneration, unspecified cervical region: Secondary | ICD-10-CM | POA: Diagnosis not present

## 2016-06-23 DIAGNOSIS — M9902 Segmental and somatic dysfunction of thoracic region: Secondary | ICD-10-CM | POA: Diagnosis not present

## 2016-06-23 DIAGNOSIS — M9904 Segmental and somatic dysfunction of sacral region: Secondary | ICD-10-CM | POA: Diagnosis not present

## 2016-06-23 DIAGNOSIS — M9901 Segmental and somatic dysfunction of cervical region: Secondary | ICD-10-CM | POA: Diagnosis not present

## 2016-06-23 NOTE — Telephone Encounter (Signed)
New message    Pt is calling stating that [facility] said [facility] have not received an order to turn down her cpap machine.

## 2016-06-24 DIAGNOSIS — G4733 Obstructive sleep apnea (adult) (pediatric): Secondary | ICD-10-CM | POA: Diagnosis not present

## 2016-06-25 ENCOUNTER — Telehealth: Payer: Self-pay | Admitting: Cardiovascular Disease

## 2016-06-25 NOTE — Telephone Encounter (Signed)
New Message     CPAP pressure is to high and she can not sleep the change was suppose to be done last week

## 2016-06-25 NOTE — Telephone Encounter (Signed)
Spoke with pt she states that she has called [facility] and they have told her that they have not received an order from Dr Claiborne Billings to change settings.  Spoke with Quillian Quince at [facility] he states that he received orders yesterday and states that they will call pt within the hour. And that this can be changed remotely. Phone number given he will call back if anything else is needed

## 2016-06-28 NOTE — Telephone Encounter (Signed)
Pt notified she states that she has not heard brom them yet. Pt is asking if a BIPAP would work better for her because she cannot stand the air blowing in her face. Please advise

## 2016-06-28 NOTE — Telephone Encounter (Signed)
New Message     Called again regarding her CPAP , its not working at all, she is not sleeping at all

## 2016-06-30 DIAGNOSIS — Z131 Encounter for screening for diabetes mellitus: Secondary | ICD-10-CM | POA: Diagnosis not present

## 2016-07-02 ENCOUNTER — Telehealth: Payer: Self-pay | Admitting: Cardiovascular Disease

## 2016-07-02 NOTE — Telephone Encounter (Signed)
New Message  Pt call questing to speak with RN. Pt states her PCP prescribed her Tikosyn and would like to know if the medication would cause a reaction with her other medications. Please call back to discuss

## 2016-07-02 NOTE — Telephone Encounter (Signed)
Patient stated she was dx with diabetes today and her PCP started her on Metformin. Patient is concerned about taking Tikosyn with her new medication, Metformin. Patient stated she is not going to start Metformin until she hears back from our office. Will forward to pharmacist at our office.

## 2016-07-02 NOTE — Telephone Encounter (Signed)
Called patient about Megan's recommendation. Patient will come to the A. FIB clinic to have EKG next Friday.

## 2016-07-02 NOTE — Telephone Encounter (Signed)
May need to check to make certain her mask is fitting properly and that there is not a single view mask leak.  I may be possible to reduce her CPAP pressures from her present setting.

## 2016-07-02 NOTE — Telephone Encounter (Signed)
Metformin is not contraindicated to use with Tikosyn, but coadministration may increase the concentration of Tikosyn. Would recommend that pt come in for an EKG ~1 week after starting metformin therapy. Her QTc was stable on most recent EKG from 1 month ago.

## 2016-07-07 ENCOUNTER — Ambulatory Visit
Admission: RE | Admit: 2016-07-07 | Discharge: 2016-07-07 | Disposition: A | Discharge status: Home / Self Care | Payer: Medicare Other | Source: Ambulatory Visit | Attending: Internal Medicine | Admitting: Internal Medicine

## 2016-07-07 DIAGNOSIS — Z1231 Encounter for screening mammogram for malignant neoplasm of breast: Secondary | ICD-10-CM

## 2016-07-09 ENCOUNTER — Ambulatory Visit (HOSPITAL_COMMUNITY)
Admission: RE | Admit: 2016-07-09 | Discharge: 2016-07-09 | Disposition: A | Discharge status: Home / Self Care | Payer: Medicare Other | Source: Ambulatory Visit | Attending: Nurse Practitioner | Admitting: Nurse Practitioner

## 2016-07-09 DIAGNOSIS — I4811 Longstanding persistent atrial fibrillation: Secondary | ICD-10-CM

## 2016-07-09 DIAGNOSIS — R9431 Abnormal electrocardiogram [ECG] [EKG]: Secondary | ICD-10-CM | POA: Diagnosis not present

## 2016-07-09 DIAGNOSIS — R001 Bradycardia, unspecified: Secondary | ICD-10-CM | POA: Insufficient documentation

## 2016-07-09 DIAGNOSIS — I4891 Unspecified atrial fibrillation: Secondary | ICD-10-CM | POA: Diagnosis present

## 2016-07-09 DIAGNOSIS — I481 Persistent atrial fibrillation: Secondary | ICD-10-CM | POA: Insufficient documentation

## 2016-07-09 MED ORDER — METOPROLOL TARTRATE 25 MG PO TABS: 25.0000 mg | ORAL_TABLET | Freq: Two times a day (BID) | ORAL | 3 refills | Status: DC

## 2016-07-09 NOTE — Patient Instructions (Signed)
Decrease metoprolol to 25 mg BID

## 2016-07-09 NOTE — Progress Notes (Addendum)
Pt in for EKG today since starting Metformin.  EKG to be reviewed by Roderic Palau, NP  Pt was sent here for f/u of EKG on tikosyn  with metformin added. QTc  Remains stable. There is chronic elevation in lateral leads unchanged from previous ekg's and ekg reads Acute MI which does not fit clinical picture. She is also c/o of some dizziness at times and BP today is 100/60 with HR in the 50's. Will decrease metoprolol to 25 mg bid and she f/u's with Dr. Johnsie Cancel the 19th.  EKG at 59 bpm, LAD, PR int 208 ms, qrs int 88 ms, qtc 433 ms, septal infarct age undetermined.

## 2016-07-11 NOTE — Progress Notes (Signed)
Cardiology Office Note:    Date:  07/15/2016   ID:  Dominique Bauer, DOB 09/27/1945, MRN [id number]  PCP:  Dominique Burly, MD  Cardiologist:  Dr. Jenkins Rouge   Electrophysiologist:  n/a  Referring MD: Dominique Burly, MD   Chief Complaint  Patient presents with  . Atrial Fibrillation    History of Present Illness:    Dominique Bauer is a 71 y.o. female with a hx of persistent AF, HTN.  She has failed DCCV alone and  failed DCCV on AAD therapy with Flecainide (01/07/16).  03/2016  Hospitalized for Tikosyn Rx Maintaining NSR  QT rechecked on metformin and has  Been ok   CHADS2-VASc=3 (female, 71 yo, HTN).     Having issues with CPAP for her OSA and seeing Dr Dominique Bauer  Prior CV studies that were reviewed today include:    ETT 12/04/15 Blood pressure demonstrated a normal response to exercise. There was no ST segment deviation noted during stress. Negative, adequate stress test.  Echo 10/16 [facility]) Mild to mod conc LVH, EF 55-60, mild to mod LAE, mild aortic sclerosis, trace MR  Past Medical History:  Diagnosis Date  . Body mass index 40.0-44.9, adult ([place])   . H/O measles   . H/O mumps   . H/O: whooping cough   . History of chicken pox   . Hypertension   . Persistent atrial fibrillation ([place])   . Sciatica   . Sleep apnea    not using aything at night right now/LH    Past Surgical History:  Procedure Laterality Date  . ABDOMINAL HYSTERECTOMY    . BREAST BIOPSY Right 02/01/2012  . CARDIOVERSION N/A 11/17/2015   Procedure: CARDIOVERSION;  Surgeon: Dominique Hector, MD;  Location: [facility] ENDOSCOPY;  Service: Cardiovascular;  Laterality: N/A;  . CARDIOVERSION N/A 01/07/2016   Procedure: CARDIOVERSION;  Surgeon: Dominique Hector, MD;  Location: [place];  Service: Cardiovascular;  Laterality: N/A;  . CARDIOVERSION N/A 04/29/2016   Procedure: CARDIOVERSION;  Surgeon: Dominique Klein, MD;  Location: [facility] ENDOSCOPY;  Service: Cardiovascular;  Laterality: N/A;  . DG   BONE DENSITY ([facility] HX)    . INNER EAR SURGERY    . NASAL SEPTUM SURGERY      Current Medications: Current Meds  Medication Sig  . acetaminophen (TYLENOL) 650 MG CR tablet Take 650 mg by mouth every 8 (eight) hours as needed for pain (arthritis).  Marland Kitchen albuterol (PROVENTIL HFA;VENTOLIN HFA) 108 (90 Base) MCG/ACT inhaler Inhale 2 puffs into the lungs every 6 (six) hours as needed for wheezing or shortness of breath.  Marland Kitchen apixaban (ELIQUIS) 5 MG TABS tablet Take 5 mg by mouth 2 (two) times daily.  . benazepril (LOTENSIN) 40 MG tablet Take 40 mg by mouth daily.  . calcium carbonate (CALCIUM 600) 1500 (600 Ca) MG TABS tablet Take 1,500 mg by mouth daily with breakfast.  . calcium carbonate (TUMS - DOSED IN MG ELEMENTAL CALCIUM) 500 MG chewable tablet Chew 1 tablet by mouth daily as needed for indigestion or heartburn.  . cholecalciferol (VITAMIN D) 1000 units tablet Take 1,000 Units by mouth daily.  Marland Kitchen dofetilide (TIKOSYN) 125 MCG capsule Take 3 capsules (375 mcg total) by mouth 2 (two) times daily.  Marland Kitchen L-LYSINE PO TAKE ONE TABLET BY MOUTH DAILY  . magnesium oxide (MAG-OX) 400 (241.3 Mg) MG tablet Take 1 tablet (400 mg total) by mouth daily.  . metFORMIN (GLUCOPHAGE) 1000 MG tablet Take 1,000 mg by mouth 2 (two) times daily.  Marland Kitchen  metoprolol tartrate (LOPRESSOR) 25 MG tablet Take 1 tablet (25 mg total) by mouth 2 (two) times daily.  . multivitamin-lutein (OCUVITE-LUTEIN) CAPS capsule Take 1 capsule by mouth daily.  . Omega-3 Fatty Acids (FISH OIL) 1000 MG CAPS Take 1,000 mg by mouth daily.  Marland Kitchen triamcinolone cream (KENALOG) 0.1 % Apply 1 application topically daily as needed. For rash  . zinc gluconate 50 MG tablet Take 50 mg by mouth daily.     Allergies:   Penicillins and Neosporin [neomycin-bacitracin zn-polymyx]   Social History   Social History  . Marital status: Married    Spouse name: N/A  . Number of children: N/A  . Years of education: N/A   Social History Main Topics  . Smoking status:  Never Smoker  . Smokeless tobacco: Never Used  . Alcohol use No  . Drug use: No  . Sexual activity: Not Asked   Other Topics Concern  . None   Social History Narrative   Retired Marine scientist.  Lives in [place].     Family History:  The patient's family history includes Breast cancer in her maternal aunt and sister; Hypertension in her mother.   ROS:   Please see the history of present illness.    Review of Systems  Constitution: Positive for diaphoresis and malaise/fatigue.  Cardiovascular: Positive for dyspnea on exertion and irregular heartbeat.  Respiratory: Positive for snoring.   Skin: Positive for rash.  Musculoskeletal: Positive for back pain, joint swelling and myalgias.  Neurological: Positive for dizziness and headaches.   All other systems reviewed and are negative.   EKGs/Labs/Other Test Reviewed:    EKG:   07/09/16 SR rate 58 QT 438  Recent Labs: 01/05/2016: Hemoglobin 13.7; Platelets 277 05/07/2016: BUN 18; Creatinine, Ser 0.72; Magnesium 1.9; Potassium 4.6; Sodium 136   Recent Lipid Panel No results found for: CHOL, TRIG, HDL, CHOLHDL, VLDL, LDLCALC, LDLDIRECT   Physical Exam:    VS:  BP 126/66   Pulse 68   Ht 5\' 8"  (1.727 m)   Wt 266 lb 12.8 oz (121 kg)   SpO2 96%   BMI 40.57 kg/m     Wt Readings from Last 3 Encounters:  07/15/16 266 lb 12.8 oz (121 kg)  05/27/16 265 lb (120.2 kg)  05/07/16 265 lb (120.2 kg)     Physical Exam  Constitutional: She is oriented to person, place, and time. She appears well-developed and well-nourished. No distress.  HENT:  Head: Normocephalic and atraumatic.  Eyes: No scleral icterus.  Neck: No JVD present.  Cardiovascular: Normal rate, S1 normal and S2 normal.  An irregularly irregular rhythm present.  No murmur heard. Pulmonary/Chest: She has no wheezes. She has no rales.  Abdominal: There is no tenderness.  Musculoskeletal: She exhibits no edema.  Neurological: She is alert and oriented to person, place, and time.    Skin: Skin is warm and dry.  Psychiatric: She has a normal mood and affect.    ASSESSMENT:    1. Paroxysmal atrial fibrillation (HCC)    PLAN:    In order of problems listed above:  1. Persistent AF - NSR on Tikosyn QT ok continue Eliquis   2. HTN - Controlled.   3. OSA - F/U with Dr Dominique Bauer regarding issues with CPAP   Medication Adjustments/Labs and Tests Ordered: Current medicines are reviewed at length with the patient today.  Concerns regarding medicines are outlined above.  Medication changes, Labs and Tests ordered today are outlined in the Patient Instructions noted below. Patient  Instructions  Medication Instructions:  Your physician recommends that you continue on your current medications as directed. Please refer to the Current Medication list given to you today.  Labwork: NONE  Testing/Procedures: NONE  Follow-Up: Your physician recommends that you schedule a follow-up appointment in: 3 months with [facility].  Your physician wants you to follow-up in: 6 months with Dr. Johnsie Cancel. You will receive a reminder letter in the mail two months in advance. If you don't receive a letter, please call our office to schedule the follow-up appointment.   If you need a refill on your cardiac medications before your next appointment, please call your pharmacy.     Signed, Jenkins Rouge, MD  07/15/2016 12:11 PM    [facility] [address], [place], [place]  [address] Phone: [phone]; Fax: [phone]

## 2016-07-14 DIAGNOSIS — M9902 Segmental and somatic dysfunction of thoracic region: Secondary | ICD-10-CM | POA: Diagnosis not present

## 2016-07-14 DIAGNOSIS — M9904 Segmental and somatic dysfunction of sacral region: Secondary | ICD-10-CM | POA: Diagnosis not present

## 2016-07-14 DIAGNOSIS — M9901 Segmental and somatic dysfunction of cervical region: Secondary | ICD-10-CM | POA: Diagnosis not present

## 2016-07-14 DIAGNOSIS — M9903 Segmental and somatic dysfunction of lumbar region: Secondary | ICD-10-CM | POA: Diagnosis not present

## 2016-07-14 DIAGNOSIS — M5030 Other cervical disc degeneration, unspecified cervical region: Secondary | ICD-10-CM | POA: Diagnosis not present

## 2016-07-14 NOTE — Telephone Encounter (Signed)
Staff message sent to Dominique Bauer to contact patient to come in get a mask fit.

## 2016-07-15 ENCOUNTER — Other Ambulatory Visit: Payer: Self-pay | Admitting: *Deleted

## 2016-07-15 ENCOUNTER — Ambulatory Visit (INDEPENDENT_AMBULATORY_CARE_PROVIDER_SITE_OTHER): Payer: Medicare Other | Admitting: Cardiovascular Disease

## 2016-07-15 ENCOUNTER — Encounter: Payer: Self-pay | Admitting: Cardiovascular Disease

## 2016-07-15 VITALS — BP 126/66 | HR 68 | Ht 68.0 in | Wt 266.8 lb

## 2016-07-15 DIAGNOSIS — I480 Paroxysmal atrial fibrillation: Secondary | ICD-10-CM | POA: Diagnosis not present

## 2016-07-15 DIAGNOSIS — G4733 Obstructive sleep apnea (adult) (pediatric): Secondary | ICD-10-CM

## 2016-07-15 DIAGNOSIS — Z9989 Dependence on other enabling machines and devices: Principal | ICD-10-CM

## 2016-07-15 NOTE — Patient Instructions (Addendum)
Medication Instructions:  Your physician recommends that you continue on your current medications as directed. Please refer to the Current Medication list given to you today.  Labwork: NONE  Testing/Procedures: NONE  Follow-Up: Your physician recommends that you schedule a follow-up appointment in: 3 months with [facility].  Your physician wants you to follow-up in: 6 months with Dr. Johnsie Cancel. You will receive a reminder letter in the mail two months in advance. If you don't receive a letter, please call our office to schedule the follow-up appointment.   If you need a refill on your cardiac medications before your next appointment, please call your pharmacy.

## 2016-07-18 DIAGNOSIS — G4733 Obstructive sleep apnea (adult) (pediatric): Secondary | ICD-10-CM | POA: Diagnosis not present

## 2016-07-21 ENCOUNTER — Encounter: Payer: Self-pay | Admitting: Cardiovascular Disease

## 2016-07-21 ENCOUNTER — Ambulatory Visit: Payer: Medicare Other | Admitting: Cardiovascular Disease

## 2016-07-21 ENCOUNTER — Ambulatory Visit (INDEPENDENT_AMBULATORY_CARE_PROVIDER_SITE_OTHER): Payer: Medicare Other | Admitting: Cardiovascular Disease

## 2016-07-21 DIAGNOSIS — Z9989 Dependence on other enabling machines and devices: Secondary | ICD-10-CM

## 2016-07-21 DIAGNOSIS — G4733 Obstructive sleep apnea (adult) (pediatric): Secondary | ICD-10-CM

## 2016-07-21 DIAGNOSIS — I480 Paroxysmal atrial fibrillation: Secondary | ICD-10-CM | POA: Diagnosis not present

## 2016-07-21 DIAGNOSIS — I10 Essential (primary) hypertension: Secondary | ICD-10-CM | POA: Diagnosis not present

## 2016-07-21 DIAGNOSIS — I481 Persistent atrial fibrillation: Secondary | ICD-10-CM | POA: Diagnosis not present

## 2016-07-21 DIAGNOSIS — Z7901 Long term (current) use of anticoagulants: Secondary | ICD-10-CM

## 2016-07-21 DIAGNOSIS — I4819 Other persistent atrial fibrillation: Secondary | ICD-10-CM

## 2016-07-21 NOTE — Patient Instructions (Addendum)
Your physician recommends that you schedule a follow-up appointment in: 3 months with Dr Claiborne Billings.  [facility] will [facility] to get you a new mask.

## 2016-07-21 NOTE — Progress Notes (Signed)
Cardiology Office Note    Date:  07/23/2016   ID:  [name], DOB [birth year], MRN [id number]  PCP:  [name], MD  Cardiologist:  Shelva Majestic, MD (sleep), Dr. Johnsie Cancel, D. [name] (EP)  New sleep evaluation  History of Present Illness:  Dominique Bauer is a 71 y.o. female who presents for evaluation following initiation of CPAP therapy for obstructive sleep apnea.  Dominique Bauer is a patient of Dr. Johnsie Cancel and and Dr. [name] who has a history of hypertension, and atrial fibrillation.  She had a long history of persistent atrial fibrillation and had failed previous antiarrhythmic therapy and cardioversions, but recently underwent successful cardioversion with dofetilide.  Her left atrial size is mildly enlarged at 43 mm.  Due to concerns for sleep apnea she was referred for diagnostic sleep study which was done on 02/15/2016.  This revealed severe obstructive sleep apnea with an AHI of 31.2 per hour.  Sleep apnea was more severe during Rehm sleep with an AHI of 58.3 per hour and with supine position at 46.1 per hour.  She had significant oxygen desaturation to a nadir of 69% on her diagnostic study.  She socially underwent a CPAP titration trial on 05/05/2016.  It was initially recommended that she use CPAP auto with a minimum pressure of 10 and maximum pressure of 18 cm water pressure with heated humidification.  She was unable to tolerate the high pressures and ultimately this was reduced to a maximum pressure of 16.  She has been using a fullface mask.  A download was obtained from 06/21/2016 through 07/20/2016.  This demonstrates excellent compliance with 100% usage stays at 97% of days with usage greater than 4 hours.  However, she was only sleeping 4 hours and 30 minutes.  There was no significant leak.  Her 95th percentile pressure was 13.4 with a maximum average pressure of 14.2.  Upon further questioning, she states that she typically goes to bed between 11 PM and midnight and often is  in bed until 10 AM in the morning.  As result, she has not been using the CPAP through the duration of her time in bed.  She does feel improved since initiating CPAP.  She has been unaware of any recurrent atrial fibrillation.  There is no history of restless legs.  She denies awareness of breakthrough snoring.  She denies  bruxism, restless legs, hypnogognic hallucinations, or cataplexy.  She presents for evaluation.   Past Medical History:  Diagnosis Date  . Body mass index 40.0-44.9, adult ([place])   . H/O measles   . H/O mumps   . H/O: whooping cough   . History of chicken pox   . Hypertension   . Persistent atrial fibrillation ([place])   . Sciatica   . Sleep apnea    not using aything at night right now/LH    Past Surgical History:  Procedure Laterality Date  . ABDOMINAL HYSTERECTOMY    . BREAST BIOPSY Right 02/01/2012  . CARDIOVERSION N/A 11/17/2015   Procedure: CARDIOVERSION;  Surgeon: [name], MD;  Location: [facility] ENDOSCOPY;  Service: Cardiovascular;  Laterality: N/A;  . CARDIOVERSION N/A 01/07/2016   Procedure: CARDIOVERSION;  Surgeon: [name], MD;  Location: [place];  Service: Cardiovascular;  Laterality: N/A;  . CARDIOVERSION N/A 04/29/2016   Procedure: CARDIOVERSION;  Surgeon: Sanda Klein, MD;  Location: [facility] ENDOSCOPY;  Service: Cardiovascular;  Laterality: N/A;  . DG  BONE DENSITY ([place] HX)    . INNER EAR  SURGERY    . NASAL SEPTUM SURGERY      Current Medications: Outpatient Medications Prior to Visit  Medication Sig Dispense Refill  . acetaminophen (TYLENOL) 650 MG CR tablet Take 650 mg by mouth every 8 (eight) hours as needed for pain (arthritis).    Marland Kitchen albuterol (PROVENTIL HFA;VENTOLIN HFA) 108 (90 Base) MCG/ACT inhaler Inhale 2 puffs into the lungs every 6 (six) hours as needed for wheezing or shortness of breath.    Marland Kitchen apixaban (ELIQUIS) 5 MG TABS tablet Take 5 mg by mouth 2 (two) times daily.    . benazepril (LOTENSIN) 40 MG tablet Take 40 mg by mouth  daily.    . calcium carbonate (CALCIUM 600) 1500 (600 Ca) MG TABS tablet Take 1,500 mg by mouth daily with breakfast.    . calcium carbonate (TUMS - DOSED IN MG ELEMENTAL CALCIUM) 500 MG chewable tablet Chew 1 tablet by mouth daily as needed for indigestion or heartburn.    . cholecalciferol (VITAMIN D) 1000 units tablet Take 1,000 Units by mouth daily.    Marland Kitchen dofetilide (TIKOSYN) 125 MCG capsule Take 3 capsules (375 mcg total) by mouth 2 (two) times daily. 540 capsule 3  . L-LYSINE PO TAKE ONE TABLET BY MOUTH DAILY    . magnesium oxide (MAG-OX) 400 (241.3 Mg) MG tablet Take 1 tablet (400 mg total) by mouth daily. 180 tablet 3  . metFORMIN (GLUCOPHAGE) 1000 MG tablet Take 1,000 mg by mouth 2 (two) times daily.    . metoprolol tartrate (LOPRESSOR) 25 MG tablet Take 1 tablet (25 mg total) by mouth 2 (two) times daily. 270 tablet 3  . multivitamin-lutein (OCUVITE-LUTEIN) CAPS capsule Take 1 capsule by mouth daily.    . Omega-3 Fatty Acids (FISH OIL) 1000 MG CAPS Take 1,000 mg by mouth daily.    Marland Kitchen triamcinolone cream (KENALOG) 0.1 % Apply 1 application topically daily as needed. For rash    . zinc gluconate 50 MG tablet Take 50 mg by mouth daily.     No facility-administered medications prior to visit.      Allergies:   Penicillins and Neosporin [neomycin-bacitracin zn-polymyx]   Social History   Social History  . Marital status: Married    Spouse name: N/A  . Number of children: N/A  . Years of education: N/A   Social History Main Topics  . Smoking status: Never Smoker  . Smokeless tobacco: Never Used  . Alcohol use No  . Drug use: No  . Sexual activity: Not Asked   Other Topics Concern  . 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.     Family History:  The patient's family history includes Breast cancer in her maternal aunt and sister; Hypertension in her mother.   ROS General: Negative; No fevers, chills, or night sweats;  HEENT: Negative; No changes in  vision or hearing, sinus congestion, difficulty swallowing Pulmonary: Negative; No cough, wheezing, shortness of breath, hemoptysis Cardiovascular: Negative; No chest pain, presyncope, syncope, palpitations GI: Negative; No nausea, vomiting, diarrhea, or abdominal pain GU: Negative; No dysuria, hematuria, or difficulty voiding Musculoskeletal: Negative; no myalgias, joint pain, or weakness Hematologic/Oncology: Negative; no easy bruising, bleeding Endocrine: Negative; no heat/cold intolerance; no diabetes Neuro: Negative; no changes in balance, headaches Skin: Negative; No rashes or skin lesions Psychiatric: Negative; No behavioral problems, depression Sleep:See HPI Other comprehensive 14 point system review is negative.   PHYSICAL EXAM:   VS:  BP 124/64   Pulse (!) 56  Ht 5\' 8"  (1.727 m)   Wt 265 lb (120.2 kg)   BMI 40.29 kg/m    Wt Readings from Last 3 Encounters:  07/21/16 265 lb (120.2 kg)  07/15/16 266 lb 12.8 oz (121 kg)  05/27/16 265 lb (120.2 kg)    General: Alert, oriented, no distress.  Skin: normal turgor, no rashes, warm and dry HEENT: Normocephalic, atraumatic. Pupils equal round and reactive to light; sclera anicteric; extraocular muscles intact; Fundi no hemmrhages, discs flat Nose without nasal septal hypertrophy Mouth/Parynx benign; Mallinpatti scale 3Neck: No JVD, no carotid bruits; normal carotid upstroke Lungs: clear to ausculatation and percussion; no wheezing or rales Chest wall: without tenderness to palpitation Heart: PMI not displaced, RRR, s1 s2 normal, 1/6 systolic murmur, no diastolic murmur, no rubs, gallops, thrills, or heaves Abdomen: soft, nontender; no hepatosplenomehaly, BS+; abdominal aorta nontender and not dilated by palpation. Back: no CVA tenderness Pulses 2+ Musculoskeletal: full range of motion, normal strength, no joint deformities Extremities: no clubbing cyanosis or edema, Homan's sign negative  Neurologic: grossly nonfocal; Cranial  nerves grossly wnl Psychologic: Normal mood and affect   Studies/Labs Reviewed:   EKG:  EKG is ordered today.  ECG (independently read by me): Sinus bradycardia 56 bpm.  Left axis deviation.  T-wave inversion in lead 3.  QS in V2.  QTc interval 420 ms.  PR interval 196 ms.  Recent Labs: BMP Latest Ref Rng & Units 05/07/2016 04/30/2016 04/29/2016  Glucose 65 - 99 mg/dL 130(H) 183(H) 159(H)  BUN 6 - 20 mg/dL 18 18 22(H)  Creatinine 0.44 - 1.00 mg/dL 0.72 0.79 0.82  BUN/Creat Ratio 12 - 28 - - -  Sodium 135 - 145 mmol/L 136 135 137  Potassium 3.5 - 5.1 mmol/L 4.6 3.9 4.3  Chloride 101 - 111 mmol/L 102 101 102  CO2 22 - 32 mmol/L 26 23 25   Calcium 8.9 - 10.3 mg/dL 10.7(H) 9.1 9.2     No flowsheet data found.  CBC Latest Ref Rng & Units 01/05/2016 02/07/2008  WBC 3.8 - 10.8 K/uL 9.3 8.5  Hemoglobin 11.7 - 15.5 g/dL 13.7 13.1  Hematocrit 35.0 - 45.0 % 40.8 39.6  Platelets 140 - 400 K/uL 277 219   Lab Results  Component Value Date   MCV 89.7 01/05/2016   MCV 89.8 02/07/2008   No results found for: TSH No results found for: HGBA1C   BNP No results found for: BNP  ProBNP No results found for: PROBNP   Lipid Panel  No results found for: CHOL, TRIG, HDL, CHOLHDL, VLDL, LDLCALC, LDLDIRECT   RADIOLOGY: Mm Screening Breast Tomo Bilateral  Result Date: 07/07/2016 CLINICAL DATA:  Screening. EXAM: 2D DIGITAL SCREENING BILATERAL MAMMOGRAM WITH CAD AND ADJUNCT TOMO COMPARISON:  Previous exam(s). ACR Breast Density Category c: The breast tissue is heterogeneously dense, which may obscure small masses. FINDINGS: There are no findings suspicious for malignancy. Images were processed with CAD. IMPRESSION: No mammographic evidence of malignancy. A result letter of this screening mammogram will be mailed directly to the patient. RECOMMENDATION: Screening mammogram in one year. (Code:SM-B-01Y) BI-RADS CATEGORY  1: Negative. Electronically Signed   By: Dorise Bullion III M.D   On: 07/07/2016  19:03     Additional studies/ records that were reviewed today include:  I have read the office notes of Drs. [name] admission, and reviewed her diagnostic polysomnogram as well as CPAP titration trial.  I obtained a download of her CPAP unit in the office today for compliance and efficacy assessment.  ASSESSMENT:    1. OSA on CPAP   2. Paroxysmal atrial fibrillation (HCC)   3. Persistent atrial fibrillation ([place]) ; previously long standing history  4. Essential hypertension   5. Morbid obesity ([place])   6. Anticoagulation adequate      PLAN:  Dominique Bauer is a 71 year old female who has a history of hypertension, and previous persistent atrial fibrillation which proved refractory to additional antiarrhythmics and prior cardioversions, but ultimately she was successfully cardioverted following a tikosyn load.  She has been found to have severe obstructive sleep apnea with an overall AHI of 31.2 per hour and very severe sleep apnea with an AHI of 58.3 per hour.  She had marked oxygen desaturation to a nadir of 69% on her diagnostic study.  I reviewed her most recent download.  She is meeting compliance standards.  However, she is only sleeping for 4 hours and 30 minutes duration with CPAP therapy.  I reviewed normal sleep architecture and the effects of obstructive sleep apnea on development of abnormal sleep architecture.  I discussed with her that the majority of REM sleep occurs in the second half of night and that if she is not using her CPAP   throughout the night, particularly when she needs it the most in REM sleep with associated previously documented marked hypoxia that she is at risk for significant atrial fibrillation recurrence and adverse consequences of untreated sleep apnea.  The risk of recurrent AF is double in patients with untreated sleep apnea compared with treated sleepy apnea following cardioversion or anablation procedure I spent  aproximately 40 minutes with the  patient and husband and reviewed her diagnostic study, as well as CPAP titration.  I discussed the implications of untreated sleep apnea with reference to her cardiovascular health.  She continues to have difficulty and states the 16 cm pressure is too high.  On her download, which I obtained in the office today her 95th percentile pressure is 13.4.  As result, I will reduce her maximum pressure to 14 cm water pressure.  I discussed the importance of proper mask cleansing to reduce mask leak and maintenance.  At times she has difficulty with the full face mask on her nose.  I discussed the new mask, [employer] Dreamwear  full face mask will has just been released and I believe she would do exceptionally well with this style which would not be on her nasal bridge.  I have written a prescription to [facility] for this new mask.  She currently is sleeping better but is not sleeping for adequate duration, particularly with the severity of her sleep apnea.  This may be contributing to continued residual sleepiness.  I will obtain a new download in 30 days to make certain her AHI continues to be excellent with her, reduced maximum pressure of 14 cm.  Her AHI on her most recent download was 1.2.  Her blood pressure today is stable on her current medical regimen consisting of an azygos will 40 mg, metoprolol tartrate 25 mg twice a day.  She is maintaining sinus rhythm as documented by her ECG today.  Her QTc interval is normal at 420 ms.  She is tolerating eliquis for anticoagulation without bleeding.  She is morbidly obese with a body this index of 40.3 and weight loss was recommended.  She will return to her cardiology care with Dr.Nishan.  I will see her in 3 months for follow-up sleep evaluation.   Medication Adjustments/Labs and Tests Ordered:  Current medicines are reviewed at length with the patient today.  Concerns regarding medicines are outlined above.  Medication changes, Labs and Tests ordered today  are listed in the Patient Instructions below.  Patient Instructions  Your physician recommends that you schedule a follow-up appointment in: 3 months with Dr Claiborne Billings.  [facility] will [facility] to get you a new mask.     Signed, Shelva Majestic, MD  07/23/2016 4:32 PM    [facility] [address] [place], [place], [place]  [address] Phone: [phone]

## 2016-07-28 DIAGNOSIS — M9902 Segmental and somatic dysfunction of thoracic region: Secondary | ICD-10-CM | POA: Diagnosis not present

## 2016-07-28 DIAGNOSIS — M9903 Segmental and somatic dysfunction of lumbar region: Secondary | ICD-10-CM | POA: Diagnosis not present

## 2016-07-28 DIAGNOSIS — M5137 Other intervertebral disc degeneration, lumbosacral region: Secondary | ICD-10-CM | POA: Diagnosis not present

## 2016-07-28 DIAGNOSIS — M9904 Segmental and somatic dysfunction of sacral region: Secondary | ICD-10-CM | POA: Diagnosis not present

## 2016-07-29 ENCOUNTER — Institutional Professional Consult (permissible substitution): Payer: Medicare Other | Admitting: Cardiovascular Disease

## 2016-07-30 DIAGNOSIS — H73892 Other specified disorders of tympanic membrane, left ear: Secondary | ICD-10-CM | POA: Diagnosis not present

## 2016-08-05 DIAGNOSIS — I480 Paroxysmal atrial fibrillation: Secondary | ICD-10-CM | POA: Diagnosis not present

## 2016-08-05 DIAGNOSIS — I10 Essential (primary) hypertension: Secondary | ICD-10-CM | POA: Diagnosis not present

## 2016-08-05 DIAGNOSIS — E1165 Type 2 diabetes mellitus with hyperglycemia: Secondary | ICD-10-CM | POA: Diagnosis not present

## 2016-08-10 DIAGNOSIS — I10 Essential (primary) hypertension: Secondary | ICD-10-CM | POA: Diagnosis not present

## 2016-08-10 DIAGNOSIS — I480 Paroxysmal atrial fibrillation: Secondary | ICD-10-CM | POA: Diagnosis not present

## 2016-08-17 DIAGNOSIS — G4733 Obstructive sleep apnea (adult) (pediatric): Secondary | ICD-10-CM | POA: Diagnosis not present

## 2016-08-18 DIAGNOSIS — M5137 Other intervertebral disc degeneration, lumbosacral region: Secondary | ICD-10-CM | POA: Diagnosis not present

## 2016-08-18 DIAGNOSIS — M9903 Segmental and somatic dysfunction of lumbar region: Secondary | ICD-10-CM | POA: Diagnosis not present

## 2016-08-18 DIAGNOSIS — M9902 Segmental and somatic dysfunction of thoracic region: Secondary | ICD-10-CM | POA: Diagnosis not present

## 2016-08-18 DIAGNOSIS — M9904 Segmental and somatic dysfunction of sacral region: Secondary | ICD-10-CM | POA: Diagnosis not present

## 2016-08-26 ENCOUNTER — Ambulatory Visit (HOSPITAL_COMMUNITY): Payer: Medicare Other | Admitting: Nurse Practitioner

## 2016-09-17 DIAGNOSIS — I480 Paroxysmal atrial fibrillation: Secondary | ICD-10-CM | POA: Diagnosis not present

## 2016-09-17 DIAGNOSIS — G4733 Obstructive sleep apnea (adult) (pediatric): Secondary | ICD-10-CM | POA: Diagnosis not present

## 2016-09-17 DIAGNOSIS — I10 Essential (primary) hypertension: Secondary | ICD-10-CM | POA: Diagnosis not present

## 2016-09-24 ENCOUNTER — Encounter (HOSPITAL_COMMUNITY): Payer: Self-pay | Admitting: Nurse Practitioner

## 2016-09-24 ENCOUNTER — Ambulatory Visit (HOSPITAL_COMMUNITY)
Admission: RE | Admit: 2016-09-24 | Discharge: 2016-09-24 | Disposition: A | Discharge status: Home / Self Care | Payer: Medicare Other | Source: Ambulatory Visit | Attending: Nurse Practitioner | Admitting: Nurse Practitioner

## 2016-09-24 VITALS — BP 116/68 | HR 65 | Ht 68.0 in | Wt 262.2 lb

## 2016-09-24 DIAGNOSIS — I10 Essential (primary) hypertension: Secondary | ICD-10-CM | POA: Diagnosis not present

## 2016-09-24 DIAGNOSIS — G4730 Sleep apnea, unspecified: Secondary | ICD-10-CM | POA: Diagnosis not present

## 2016-09-24 DIAGNOSIS — Z8249 Family history of ischemic heart disease and other diseases of the circulatory system: Secondary | ICD-10-CM | POA: Insufficient documentation

## 2016-09-24 DIAGNOSIS — R002 Palpitations: Secondary | ICD-10-CM | POA: Insufficient documentation

## 2016-09-24 DIAGNOSIS — Z888 Allergy status to other drugs, medicaments and biological substances status: Secondary | ICD-10-CM | POA: Diagnosis not present

## 2016-09-24 DIAGNOSIS — Z880 Allergy status to penicillin: Secondary | ICD-10-CM | POA: Diagnosis not present

## 2016-09-24 DIAGNOSIS — Z7984 Long term (current) use of oral hypoglycemic drugs: Secondary | ICD-10-CM | POA: Diagnosis not present

## 2016-09-24 DIAGNOSIS — Z90710 Acquired absence of both cervix and uterus: Secondary | ICD-10-CM | POA: Diagnosis not present

## 2016-09-24 DIAGNOSIS — R197 Diarrhea, unspecified: Secondary | ICD-10-CM | POA: Diagnosis not present

## 2016-09-24 DIAGNOSIS — I481 Persistent atrial fibrillation: Secondary | ICD-10-CM | POA: Insufficient documentation

## 2016-09-24 DIAGNOSIS — I480 Paroxysmal atrial fibrillation: Secondary | ICD-10-CM | POA: Insufficient documentation

## 2016-09-24 DIAGNOSIS — Z803 Family history of malignant neoplasm of breast: Secondary | ICD-10-CM | POA: Diagnosis not present

## 2016-09-24 DIAGNOSIS — Z9889 Other specified postprocedural states: Secondary | ICD-10-CM | POA: Diagnosis not present

## 2016-09-24 DIAGNOSIS — Z7902 Long term (current) use of antithrombotics/antiplatelets: Secondary | ICD-10-CM | POA: Diagnosis not present

## 2016-09-24 DIAGNOSIS — Z8619 Personal history of other infectious and parasitic diseases: Secondary | ICD-10-CM | POA: Insufficient documentation

## 2016-09-24 LAB — MAGNESIUM: Magnesium: 1.6 mg/dL — ABNORMAL LOW (ref 1.7–2.4)

## 2016-09-24 LAB — BASIC METABOLIC PANEL
Anion gap: 8 (ref 5–15)
BUN: 14 mg/dL (ref 6–20)
CO2: 24 mmol/L (ref 22–32)
CREATININE: 0.74 mg/dL (ref 0.44–1.00)
Calcium: 10.3 mg/dL (ref 8.9–10.3)
Chloride: 102 mmol/L (ref 101–111)
GFR calc Af Amer: >60 mL/min (ref >60)
Glucose, Bld: 188 mg/dL — ABNORMAL HIGH (ref 65–99)
Potassium: 4.1 mmol/L (ref 3.5–5.1)
Sodium: 134 mmol/L — ABNORMAL LOW (ref 135–145)

## 2016-09-24 NOTE — Progress Notes (Signed)
Primary Care Physician: [name], MD Referring Physician: Dr. Blinda Leatherwood Dominique Bauer is a 71 y.o. female with a h/o afib maintaining SR on tikosyn. She asked to be seen for some palpitations  she has noticed the last 2 evenings. This has felt different form her afib and only lasted for a short time. The only change in her health is that she has been started on metformin in April and has had daily loose stools with it. Continues on dofetilide 125 mcg bid with stable qtc.  Today, she denies symptoms of palpitations, chest pain, shortness of breath, orthopnea, PND, lower extremity edema, dizziness, presyncope, syncope, or neurologic sequela. The patient is tolerating medications without difficulties and is otherwise without complaint today.   Past Medical History:  Diagnosis Date  . Body mass index 40.0-44.9, adult ([place])   . H/O measles   . H/O mumps   . H/O: whooping cough   . History of chicken pox   . Hypertension   . Persistent atrial fibrillation ([place])   . Sciatica   . Sleep apnea    not using aything at night right now/LH   Past Surgical History:  Procedure Laterality Date  . ABDOMINAL HYSTERECTOMY    . BREAST BIOPSY Right 02/01/2012  . CARDIOVERSION N/A 11/17/2015   Procedure: CARDIOVERSION;  Surgeon: [name], MD;  Location: [facility] ENDOSCOPY;  Service: Cardiovascular;  Laterality: N/A;  . CARDIOVERSION N/A 01/07/2016   Procedure: CARDIOVERSION;  Surgeon: [name], MD;  Location: [place];  Service: Cardiovascular;  Laterality: N/A;  . CARDIOVERSION N/A 04/29/2016   Procedure: CARDIOVERSION;  Surgeon: Sanda Klein, MD;  Location: [facility] ENDOSCOPY;  Service: Cardiovascular;  Laterality: N/A;  . DG  BONE DENSITY ([place] HX)    . INNER EAR SURGERY    . NASAL SEPTUM SURGERY      Current Outpatient Prescriptions  Medication Sig Dispense Refill  . acetaminophen (TYLENOL) 650 MG CR tablet Take 650 mg by mouth every 8 (eight) hours as needed for pain (arthritis).     Marland Kitchen albuterol (PROVENTIL HFA;VENTOLIN HFA) 108 (90 Base) MCG/ACT inhaler Inhale 2 puffs into the lungs every 6 (six) hours as needed for wheezing or shortness of breath.    Marland Kitchen apixaban (ELIQUIS) 5 MG TABS tablet Take 5 mg by mouth 2 (two) times daily.    . benazepril (LOTENSIN) 40 MG tablet Take 40 mg by mouth daily.    . calcium carbonate (CALCIUM 600) 1500 (600 Ca) MG TABS tablet Take 1,500 mg by mouth daily with breakfast.    . calcium carbonate (TUMS - DOSED IN MG ELEMENTAL CALCIUM) 500 MG chewable tablet Chew 1 tablet by mouth daily as needed for indigestion or heartburn.    . cholecalciferol (VITAMIN D) 1000 units tablet Take 1,000 Units by mouth daily.    Marland Kitchen dofetilide (TIKOSYN) 125 MCG capsule Take 3 capsules (375 mcg total) by mouth 2 (two) times daily. 540 capsule 3  . L-LYSINE PO TAKE ONE TABLET BY MOUTH DAILY    . magnesium oxide (MAG-OX) 400 (241.3 Mg) MG tablet Take 1 tablet (400 mg total) by mouth daily. 180 tablet 3  . metFORMIN (GLUCOPHAGE) 1000 MG tablet Take 1,000 mg by mouth 2 (two) times daily.    . metoprolol tartrate (LOPRESSOR) 25 MG tablet Take 1 tablet (25 mg total) by mouth 2 (two) times daily. 270 tablet 3  . multivitamin-lutein (OCUVITE-LUTEIN) CAPS capsule Take 1 capsule by mouth daily.    . Omega-3 Fatty  Acids (FISH OIL) 1000 MG CAPS Take 1,000 mg by mouth daily.    Marland Kitchen triamcinolone cream (KENALOG) 0.1 % Apply 1 application topically daily as needed. For rash    . zinc gluconate 50 MG tablet Take 50 mg by mouth daily.     No current facility-administered medications for this encounter.     Allergies  Allergen Reactions  . Penicillins Hives  . Neosporin [Neomycin-Bacitracin Zn-Polymyx] Rash    Social History   Social History  . Marital status: Married    Spouse name: N/A  . Number of children: N/A  . Years of education: N/A   Occupational History  . Not on file.   Social History Main Topics  . Smoking status: Never Smoker  . Smokeless tobacco: Never  Used  . Alcohol use No  . Drug use: No  . Sexual activity: Not on file   Other Topics Concern  . 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    Family History  Problem Relation Age of Onset  . Hypertension Mother   . Breast cancer Sister   . Breast cancer Maternal Aunt     ROS- All systems are reviewed and negative except as per the HPI above  Physical Exam: Vitals:   09/24/16 1112  BP: 116/68  Pulse: 65  Weight: 262 lb 3.2 oz (118.9 kg)  Height: 5\' 8"  (1.727 m)   Wt Readings from Last 3 Encounters:  09/24/16 262 lb 3.2 oz (118.9 kg)  07/21/16 265 lb (120.2 kg)  07/15/16 266 lb 12.8 oz (121 kg)    Labs: Lab Results  Component Value Date   NA 136 05/07/2016   K 4.6 05/07/2016   CL 102 05/07/2016   CO2 26 05/07/2016   GLUCOSE 130 (H) 05/07/2016   BUN 18 05/07/2016   CREATININE 0.72 05/07/2016   CALCIUM 10.7 (H) 05/07/2016   MG 1.9 05/07/2016   Lab Results  Component Value Date   INR 1.0 01/05/2016   No results found for: CHOL, HDL, LDLCALC, TRIG   GEN- The patient is well appearing, alert and oriented x 3 today.   Head- normocephalic, atraumatic Eyes-  Sclera clear, conjunctiva pink Ears- hearing intact Oropharynx- clear Neck- supple, no JVP Lymph- no cervical lymphadenopathy Lungs- Clear to ausculation bilaterally, normal work of breathing Heart- Regular rate and rhythm, no murmurs, rubs or gallops, PMI not laterally displaced GI- soft, NT, ND, + BS Extremities- no clubbing, cyanosis, or edema MS- no significant deformity or atrophy Skin- no rash or lesion Psych- euthymic mood, full affect Neuro- strength and sensation are intact  EKG-NSR at 65 bpm, pr int 198 ms, qrs int 96 ms, qtc 434 ms Epic records reviewed    Assessment and Plan: 1. Paroxysmal afib Maintaining SR but with a few palpitations the last two evenings If persists will order a monitor Continue dofetilide 375 mcg bid Continue BB Continue eliquis  5 mg bid BMET/MAG today to make sure diarrhea has not caused electrolyte imbalance causing PC's  Will notify of lab results and she will let me know if a monitor is needed  Butch Penny C. Thomasa Heidler, [facility] [address] [place], [place] [address] [phone]

## 2016-10-15 DIAGNOSIS — I480 Paroxysmal atrial fibrillation: Secondary | ICD-10-CM | POA: Diagnosis not present

## 2016-10-15 DIAGNOSIS — I10 Essential (primary) hypertension: Secondary | ICD-10-CM | POA: Diagnosis not present

## 2016-10-17 DIAGNOSIS — G4733 Obstructive sleep apnea (adult) (pediatric): Secondary | ICD-10-CM | POA: Diagnosis not present

## 2016-10-21 ENCOUNTER — Ambulatory Visit (HOSPITAL_COMMUNITY): Payer: Medicare Other | Admitting: Nurse Practitioner

## 2016-10-25 ENCOUNTER — Encounter: Payer: Self-pay | Admitting: Cardiovascular Disease

## 2016-10-26 ENCOUNTER — Ambulatory Visit (INDEPENDENT_AMBULATORY_CARE_PROVIDER_SITE_OTHER): Payer: Medicare Other | Admitting: Cardiovascular Disease

## 2016-10-26 ENCOUNTER — Encounter: Payer: Self-pay | Admitting: Cardiovascular Disease

## 2016-10-26 VITALS — BP 112/70 | HR 53 | Ht 68.0 in | Wt 262.0 lb

## 2016-10-26 DIAGNOSIS — R001 Bradycardia, unspecified: Secondary | ICD-10-CM | POA: Diagnosis not present

## 2016-10-26 DIAGNOSIS — Z7901 Long term (current) use of anticoagulants: Secondary | ICD-10-CM

## 2016-10-26 DIAGNOSIS — I480 Paroxysmal atrial fibrillation: Secondary | ICD-10-CM | POA: Diagnosis not present

## 2016-10-26 DIAGNOSIS — R42 Dizziness and giddiness: Secondary | ICD-10-CM

## 2016-10-26 DIAGNOSIS — G4733 Obstructive sleep apnea (adult) (pediatric): Secondary | ICD-10-CM | POA: Diagnosis not present

## 2016-10-26 MED ORDER — METOPROLOL TARTRATE 25 MG PO TABS: ORAL_TABLET | ORAL | 3 refills | Status: DC

## 2016-10-26 NOTE — Patient Instructions (Signed)
Medication Instructions:  DECREASE your evening dose of metoprolol to 12.5 mg (1/2 tablet).   You will be taking 25mg  (1 tablet) in the AM and 12.5 mg (1/2 tablets) in the PM.  Follow-Up: Your physician wants you to follow-up in: 1 year with Dr. Claiborne Billings (sleep clinic). You will receive a reminder letter in the mail two months in advance. If you don't receive a letter, please call our office to schedule the follow-up appointment.   Any Other Special Instructions Will Be Listed Below (If Applicable).     If you need a refill on your cardiac medications before your next appointment, please call your pharmacy.

## 2016-10-26 NOTE — Progress Notes (Signed)
Cardiology Office Note    Date:  10/26/2016   ID:  Dominique Bauer, DOB November 06, 1945, MRN [id number]  PCP:  [name], MD  Cardiologist:  Shelva Majestic, MD (sleep), Dr. Johnsie Cancel, D. [name] (EP)  F/U sleep evaluation  History of Present Illness:  Dominique Bauer is a 71 y.o. female who presents for a 3 month follow-up sleep evaluation.  Dominique Bauer is a patient of Dr. Johnsie Cancel and and Dr. [name] who has a history of hypertension, and atrial fibrillation.  She had a long history of persistent atrial fibrillation and had failed previous antiarrhythmic therapy and cardioversions, but recently underwent successful cardioversion with dofetilide.  Her left atrial size is mildly enlarged at 43 mm.  Due to concerns for sleep apnea she was referred for diagnostic sleep study which was done on 02/15/2016.  This revealed severe obstructive sleep apnea with an AHI of 31.2 per hour.  Sleep apnea was more severe during Rehm sleep with an AHI of 58.3 per hour and with supine position at 46.1 per hour.  She had significant oxygen desaturation to a nadir of 69% on her diagnostic study.  She socially underwent a CPAP titration trial on 05/05/2016.  It was initially recommended that she use CPAP auto with a minimum pressure of 10 and maximum pressure of 18 cm water pressure with heated humidification.  She was unable to tolerate the high pressures and ultimately this was reduced to a maximum pressure of 16.  She has been using a fullface mask.  A download was obtained from 06/21/2016 through 07/20/2016.  This demonstrated excellent compliance with 100% usage stays at 97% of days with usage greater than 4 hours.  However, she was only sleeping 4 hours and 30 minutes.  There was no significant leak.  Her 95th percentile pressure was 13.4 with a maximum average pressure of 14.2. Upon further questioning, she states that she typically goes to bed between 11 PM and midnight and often is in bed until 10 AM in the morning.   When I saw her, she was not using CPAP through the duration of her time in bed.  She felt improved since initiating CPAP.   When I saw her, she noted some difficulty with the mask.  With reference to the bridge of her nose.  At that time, I also had significant discussion with her and recommended the new Respironics dream were full face mask.  Apparently, [facility]  At the time they did not have this new mask which had just become available.  Over the past several months, she has significantly increased her sleep duration with CPAP use.  I obtained a new download in the office today from July 1 2 10/25/2016.  She is 100% compliant with usage stays and uses greater than 4 hours and is averaging 6 hours and 57 minutes of CPAP use per night.  She is on a air since 10 AutoSet ResMed system and has a minimum pressure setting at 10 and maximum of 14.  Her 95th percent.  Average pressure was 13.2.  At times she still having increased leak around her nasal bridge.  Her AHI is excellent at 1.1.  She has been unaware of any recurrent atrial fibrillation.  There is no history of restless legs.  She denies awareness of breakthrough snoring.  She denies  bruxism, restless legs, hypnogognic hallucinations, or cataplexy.  At times, she has noticed some mild episodes of transient dizziness.  She presents for evaluation.   Past Medical History:  Diagnosis Date  . Body mass index 40.0-44.9, adult ([place])   . H/O measles   . H/O mumps   . H/O: whooping cough   . History of chicken pox   . Hypertension   . Persistent atrial fibrillation ([place])   . Sciatica   . Sleep apnea    not using aything at night right now/LH    Past Surgical History:  Procedure Laterality Date  . ABDOMINAL HYSTERECTOMY    . BREAST BIOPSY Right 02/01/2012  . CARDIOVERSION N/A 11/17/2015   Procedure: CARDIOVERSION;  Surgeon: [name], MD;  Location: [facility] ENDOSCOPY;  Service: Cardiovascular;  Laterality:  N/A;  . CARDIOVERSION N/A 01/07/2016   Procedure: CARDIOVERSION;  Surgeon: [name], MD;  Location: [place];  Service: Cardiovascular;  Laterality: N/A;  . CARDIOVERSION N/A 04/29/2016   Procedure: CARDIOVERSION;  Surgeon: Sanda Klein, MD;  Location: [facility] ENDOSCOPY;  Service: Cardiovascular;  Laterality: N/A;  . DG  BONE DENSITY ([place] HX)    . INNER EAR SURGERY    . NASAL SEPTUM SURGERY      Current Medications: Outpatient Medications Prior to Visit  Medication Sig Dispense Refill  . acetaminophen (TYLENOL) 650 MG CR tablet Take 650 mg by mouth every 8 (eight) hours as needed for pain (arthritis).    Marland Kitchen albuterol (PROVENTIL HFA;VENTOLIN HFA) 108 (90 Base) MCG/ACT inhaler Inhale 2 puffs into the lungs every 6 (six) hours as needed for wheezing or shortness of breath.    Marland Kitchen apixaban (ELIQUIS) 5 MG TABS tablet Take 5 mg by mouth 2 (two) times daily.    . benazepril (LOTENSIN) 40 MG tablet Take 40 mg by mouth daily.    . calcium carbonate (CALCIUM 600) 1500 (600 Ca) MG TABS tablet Take 1,500 mg by mouth daily with breakfast.    . calcium carbonate (TUMS - DOSED IN MG ELEMENTAL CALCIUM) 500 MG chewable tablet Chew 1 tablet by mouth daily as needed for indigestion or heartburn.    . cholecalciferol (VITAMIN D) 1000 units tablet Take 1,000 Units by mouth daily.    Marland Kitchen dofetilide (TIKOSYN) 125 MCG capsule Take 3 capsules (375 mcg total) by mouth 2 (two) times daily. 540 capsule 3  . L-LYSINE PO TAKE ONE TABLET BY MOUTH DAILY    . multivitamin-lutein (OCUVITE-LUTEIN) CAPS capsule Take 1 capsule by mouth daily.    . Omega-3 Fatty Acids (FISH OIL) 1000 MG CAPS Take 1,000 mg by mouth daily.    Marland Kitchen triamcinolone cream (KENALOG) 0.1 % Apply 1 application topically daily as needed. For rash    . zinc gluconate 50 MG tablet Take 50 mg by mouth daily.    . metoprolol tartrate (LOPRESSOR) 25 MG tablet Take 1 tablet (25 mg total) by mouth 2 (two) times daily. 270 tablet 3  . magnesium oxide (MAG-OX) 400  (241.3 Mg) MG tablet Take 1 tablet (400 mg total) by mouth daily. (Patient taking differently: Take 400 mg by mouth 2 (two) times daily. ) 180 tablet 3  . metFORMIN (GLUCOPHAGE) 1000 MG tablet Take 1,000 mg by mouth 2 (two) times daily.     No facility-administered medications prior to visit.      Allergies:   Penicillins and Neosporin [neomycin-bacitracin zn-polymyx]   Social History   Social History  . Marital status: Married    Spouse name: N/A  . Number of children: N/A  . Years of education: N/A   Social History Main Topics  .  Smoking status: Never Smoker  . Smokeless tobacco: Never Used  . Alcohol use No  . Drug use: No  . Sexual activity: Not on file   Other Topics Concern  . Not on file   Social History Narrative   Retired Marine scientist.  Lives in [place].     Family History:  The patient's family history includes Breast cancer in her maternal aunt and sister; Hypertension in her mother.   ROS General: Negative; No fevers, chills, or night sweats;  HEENT: Negative; No changes in vision or hearing, sinus congestion, difficulty swallowing Pulmonary: Negative; No cough, wheezing, shortness of breath, hemoptysis Cardiovascular: Negative; No chest pain, presyncope, syncope, palpitations GI: Negative; No nausea, vomiting, diarrhea, or abdominal pain GU: Negative; No dysuria, hematuria, or difficulty voiding Musculoskeletal: Negative; no myalgias, joint pain, or weakness Hematologic/Oncology: Negative; no easy bruising, bleeding Endocrine: Negative; no heat/cold intolerance; no diabetes Neuro: Negative; no changes in balance, headaches Skin: Negative; No rashes or skin lesions Psychiatric: Negative; No behavioral problems, depression Sleep:See HPI Other comprehensive 14 point system review is negative.   PHYSICAL EXAM:   VS:  BP 112/70   Pulse (!) 53   Ht 5\' 8"  (1.727 m)   Wt 262 lb (118.8 kg)   BMI 39.84 kg/m     Repeat blood pressure by me 110/70 without orthostatic  change.  Wt Readings from Last 3 Encounters:  10/26/16 262 lb (118.8 kg)  09/24/16 262 lb 3.2 oz (118.9 kg)  07/21/16 265 lb (120.2 kg)    General: Alert, oriented, no distress.  Skin: normal turgor, no rashes, warm and dry HEENT: Normocephalic, atraumatic. Pupils equal round and reactive to light; sclera anicteric; extraocular muscles intact;  Nose without nasal septal hypertrophy Mouth/Parynx benign; Mallinpatti scale 3 Neck: No JVD, no carotid bruits; normal carotid upstroke Lungs: clear to ausculatation and percussion; no wheezing or rales Chest wall: without tenderness to palpitation Heart: PMI not displaced, RRR, s1 s2 normal, 1/6 systolic murmur, no diastolic murmur, no rubs, gallops, thrills, or heaves Abdomen: soft, nontender; no hepatosplenomehaly, BS+; abdominal aorta nontender and not dilated by palpation. Back: no CVA tenderness Pulses 2+ Musculoskeletal: full range of motion, normal strength, no joint deformities Extremities: no clubbing cyanosis or edema, Homan's sign negative  Neurologic: grossly nonfocal; Cranial nerves grossly wnl Psychologic: Normal mood and affect    Studies/Labs Reviewed:   EKG:  EKG is ordered today.  ECG (independently read by me): Sinus bradycardia 53 bpm.  PR interval 192 ms.  QTc interval 457 ms.  Nonspecific T changes in lead 3.  April 2018 ECG (independently read by me): Sinus bradycardia 56 bpm.  Left axis deviation.  T-wave inversion in lead 3.  QS in V2.  QTc interval 420 ms.  PR interval 196 ms.  Recent Labs: BMP Latest Ref Rng & Units 09/24/2016 05/07/2016 04/30/2016  Glucose 65 - 99 mg/dL 188(H) 130(H) 183(H)  BUN 6 - 20 mg/dL 14 18 18   Creatinine 0.44 - 1.00 mg/dL 0.74 0.72 0.79  BUN/Creat Ratio 12 - 28 - - -  Sodium 135 - 145 mmol/L 134(L) 136 135  Potassium 3.5 - 5.1 mmol/L 4.1 4.6 3.9  Chloride 101 - 111 mmol/L 102 102 101  CO2 22 - 32 mmol/L 24 26 23   Calcium 8.9 - 10.3 mg/dL 10.3 10.7(H) 9.1     No flowsheet data  found.  CBC Latest Ref Rng & Units 01/05/2016 02/07/2008  WBC 3.8 - 10.8 K/uL 9.3 8.5  Hemoglobin 11.7 - 15.5 g/dL 13.7  13.1  Hematocrit 35.0 - 45.0 % 40.8 39.6  Platelets 140 - 400 K/uL 277 219   Lab Results  Component Value Date   MCV 89.7 01/05/2016   MCV 89.8 02/07/2008   No results found for: TSH No results found for: HGBA1C   BNP No results found for: BNP  ProBNP No results found for: PROBNP   Lipid Panel  No results found for: CHOL, TRIG, HDL, CHOLHDL, VLDL, LDLCALC, LDLDIRECT   RADIOLOGY: No results found.   Additional studies/ records that were reviewed today include:  I had previously  read the office notes of Drs. [name] admission, and reviewed her diagnostic polysomnogram as well as CPAP titration trial.  I obtained a download of her CPAP unit in the office today for compliance and efficacy assessment.  In the office today I obtained a new download from July 1 through 10/25/2016.   ASSESSMENT:    1. OSA (obstructive sleep apnea)   2. Paroxysmal atrial fibrillation (HCC)   3. Dizziness   4. Sinus bradycardia   5. Morbid obesity ([place])   6. Anticoagulation adequate      PLAN:  Ms. Akelia Husted is a 71 year old female who has a history of hypertension, and previous persistent atrial fibrillation which proved refractory to additional antiarrhythmics and prior cardioversions, but ultimately she was successfully cardioverted following a tikosyn load.  She was  found to have severe obstructive sleep apnea with an overall AHI of 31.2 per hour and very severe sleep apnea with an AHI of 58.3 per hour.  She had marked oxygen desaturation to a nadir of 69% on her diagnostic study.  When I initially saw her for her sleep evaluation.  Several months ago I obtained a download.  At that time, she was meeting compliance standards.  However, she was sleeping for inadequate duration of only 4 hours and 30 minutes.  I reviewed normal sleep architecture and the effects of  obstructive sleep apnea on development of abnormal sleep architecture.  I discussed with her that the majority of REM sleep occurs in the second half of night and that if she is not using her CPAP  throughout the night, particularly when she needs it the most in REM sleep with associated previously documented marked hypoxia that she is at risk for significant atrial fibrillation recurrence and adverse consequences of untreated sleep apnea.  The risk of recurrent AF is double in patients with untreated sleep apnea compared with treated sleepy apnea following cardioversion or an ablation procedure.  When I saw her, she was stating that the 16 cm pressure was too high.  She was having some difficulty with her mask over the nasal bridge and I recommended the new Respironics treating were full face mask that had just been released.  Apparently when she went to her DME company the did not supply this new mask at that time.  Since I last saw her, she has made significant effort in improving sleep duration.  I obtain a new download in the office today.  She is now sleeping essentially 7 hours per night with CPAP use.  At her maximum set pressure 14, with a minimum pressure of 10.  Her 95th percentile pressure is 13.2.  She continues to have some mask leak and apparently her mask that she is using is her old mask.  I suspect now that her DME company will have the new cream were mask again every prescription for this.  I commended her on her improvement.  She feels significantly improved.  She is not snoring.  She denies residual daytime sleepiness.  She has more energy.  Of note, her ECG today reveals sinus bradycardia 53 bpm.  She has been on metoprolol 25 no grams twice a day.  I have suggested that she can try to reduce her evening dose of metoprolol to 12.5 mg but continue taking the 25 mg a.m. dose.  She will follow-up with Dr. Johnsie Cancel in October for Cardiologic evaluation.  I will see her in one year for follow-up sleep  evaluation or sooner if issues arise.   Medication Adjustments/Labs and Tests Ordered: Current medicines are reviewed at length with the patient today.  Concerns regarding medicines are outlined above.  Medication changes, Labs and Tests ordered today are listed in the Patient Instructions below.  Patient Instructions  Medication Instructions:  DECREASE your evening dose of metoprolol to 12.5 mg (1/2 tablet).   You will be taking 25mg  (1 tablet) in the AM and 12.5 mg (1/2 tablets) in the PM.  Follow-Up: Your physician wants you to follow-up in: 1 year with Dr. Claiborne Billings (sleep clinic). You will receive a reminder letter in the mail two months in advance. If you don't receive a letter, please call our office to schedule the follow-up appointment.   Any Other Special Instructions Will Be Listed Below (If Applicable).     If you need a refill on your cardiac medications before your next appointment, please call your pharmacy.      Signed, Shelva Majestic, MD  10/26/2016 5:07 PM    [facility] [address] [place], [place], [place]  [address] Phone: [phone]

## 2016-10-29 DIAGNOSIS — G4733 Obstructive sleep apnea (adult) (pediatric): Secondary | ICD-10-CM | POA: Diagnosis not present

## 2016-11-04 DIAGNOSIS — M9903 Segmental and somatic dysfunction of lumbar region: Secondary | ICD-10-CM | POA: Diagnosis not present

## 2016-11-04 DIAGNOSIS — M9904 Segmental and somatic dysfunction of sacral region: Secondary | ICD-10-CM | POA: Diagnosis not present

## 2016-11-04 DIAGNOSIS — M9902 Segmental and somatic dysfunction of thoracic region: Secondary | ICD-10-CM | POA: Diagnosis not present

## 2016-11-04 DIAGNOSIS — M5137 Other intervertebral disc degeneration, lumbosacral region: Secondary | ICD-10-CM | POA: Diagnosis not present

## 2016-11-08 ENCOUNTER — Telehealth: Payer: Self-pay | Admitting: Internal Medicine

## 2016-11-08 ENCOUNTER — Telehealth: Payer: Self-pay | Admitting: Cardiovascular Disease

## 2016-11-08 DIAGNOSIS — E1165 Type 2 diabetes mellitus with hyperglycemia: Secondary | ICD-10-CM | POA: Diagnosis not present

## 2016-11-08 DIAGNOSIS — I480 Paroxysmal atrial fibrillation: Secondary | ICD-10-CM | POA: Diagnosis not present

## 2016-11-08 DIAGNOSIS — I10 Essential (primary) hypertension: Secondary | ICD-10-CM | POA: Diagnosis not present

## 2016-11-08 NOTE — Telephone Encounter (Signed)
There are no drug-drug interactions with Tikosyn and amaryl.

## 2016-11-08 NOTE — Telephone Encounter (Signed)
Pt calling stating she was just started on Amaryl for her diabetes. Pt has question if this will be ok to take with her Tikosyn. IPt see's Dr. [name] for EP,  will forward to Dr. [name] for further advice.

## 2016-11-08 NOTE — Telephone Encounter (Signed)
Mrs.Kuehnle is calling because she was just prescribe Amoryl ( For her Diabetes) an she takes [place] and wants to know will that be ok to take . Please call

## 2016-11-08 NOTE — Telephone Encounter (Signed)
Called patient back. Consulted pharmacist, she stated Amaryl is fine to take with Tikosyn. Patient verbalized understanding and had no other questions at this time.

## 2016-11-08 NOTE — Telephone Encounter (Signed)
New message    Pt is calling to find out if amaryl is ok to take with Tikosyn. Please call.

## 2016-11-09 NOTE — Telephone Encounter (Signed)
The patient is aware of pharmacy recommendations.

## 2016-11-09 NOTE — Telephone Encounter (Signed)
I left a message for the patient to call. 

## 2016-11-10 DIAGNOSIS — I480 Paroxysmal atrial fibrillation: Secondary | ICD-10-CM | POA: Diagnosis not present

## 2016-11-10 DIAGNOSIS — M9903 Segmental and somatic dysfunction of lumbar region: Secondary | ICD-10-CM | POA: Diagnosis not present

## 2016-11-10 DIAGNOSIS — I10 Essential (primary) hypertension: Secondary | ICD-10-CM | POA: Diagnosis not present

## 2016-11-10 DIAGNOSIS — M5137 Other intervertebral disc degeneration, lumbosacral region: Secondary | ICD-10-CM | POA: Diagnosis not present

## 2016-11-10 DIAGNOSIS — M9904 Segmental and somatic dysfunction of sacral region: Secondary | ICD-10-CM | POA: Diagnosis not present

## 2016-11-10 DIAGNOSIS — M9902 Segmental and somatic dysfunction of thoracic region: Secondary | ICD-10-CM | POA: Diagnosis not present

## 2016-11-17 DIAGNOSIS — G4733 Obstructive sleep apnea (adult) (pediatric): Secondary | ICD-10-CM | POA: Diagnosis not present

## 2016-11-17 DIAGNOSIS — M9903 Segmental and somatic dysfunction of lumbar region: Secondary | ICD-10-CM | POA: Diagnosis not present

## 2016-11-17 DIAGNOSIS — M9902 Segmental and somatic dysfunction of thoracic region: Secondary | ICD-10-CM | POA: Diagnosis not present

## 2016-11-17 DIAGNOSIS — M5137 Other intervertebral disc degeneration, lumbosacral region: Secondary | ICD-10-CM | POA: Diagnosis not present

## 2016-11-17 DIAGNOSIS — M9904 Segmental and somatic dysfunction of sacral region: Secondary | ICD-10-CM | POA: Diagnosis not present

## 2016-11-22 DIAGNOSIS — M9904 Segmental and somatic dysfunction of sacral region: Secondary | ICD-10-CM | POA: Diagnosis not present

## 2016-11-22 DIAGNOSIS — M9902 Segmental and somatic dysfunction of thoracic region: Secondary | ICD-10-CM | POA: Diagnosis not present

## 2016-11-22 DIAGNOSIS — M9903 Segmental and somatic dysfunction of lumbar region: Secondary | ICD-10-CM | POA: Diagnosis not present

## 2016-11-22 DIAGNOSIS — M5137 Other intervertebral disc degeneration, lumbosacral region: Secondary | ICD-10-CM | POA: Diagnosis not present

## 2016-11-23 ENCOUNTER — Telehealth: Payer: Self-pay

## 2016-11-23 NOTE — Telephone Encounter (Signed)
Patient called and was wondering which cardiologist does she need to see and when.   Patient sees Dr. Claiborne Billings for Sleep Apnea once a year Patient sees Dr. [name] for A. FIB and taking tikosyn  Patient sees Dr. Johnsie Cancel, primary cardiologist  Will send message to Dr. [name] to see when patient needs to see him and how often, and when she should follow-up with Dr. Johnsie Cancel next. At this time she has an appointment in October with Dr. Johnsie Cancel. Patient was suppose to follow-up with Dr. [name] in September, but patient never made the appointment. Patient just wants to make sure she is seeing the right doctor at the right time.

## 2016-11-24 DIAGNOSIS — M5137 Other intervertebral disc degeneration, lumbosacral region: Secondary | ICD-10-CM | POA: Diagnosis not present

## 2016-11-24 DIAGNOSIS — M9904 Segmental and somatic dysfunction of sacral region: Secondary | ICD-10-CM | POA: Diagnosis not present

## 2016-11-24 DIAGNOSIS — M9903 Segmental and somatic dysfunction of lumbar region: Secondary | ICD-10-CM | POA: Diagnosis not present

## 2016-11-24 DIAGNOSIS — M9902 Segmental and somatic dysfunction of thoracic region: Secondary | ICD-10-CM | POA: Diagnosis not present

## 2016-11-24 NOTE — Telephone Encounter (Signed)
Will review with Dr. [name].  Patient saw Dr. Johnsie Cancel - 07/15/16 Patient saw Roderic Palau, NP- 09/24/16 (BMP/ Mag done that day).  Patient scheduled to follow up with Dr. Johnsie Cancel- 01/11/17. Do you want to see the patient April 2019?

## 2016-11-25 NOTE — Telephone Encounter (Signed)
You guys can follow her dofetildide her only real issue is PAF I can see her yearly

## 2016-11-25 NOTE — Telephone Encounter (Signed)
Dominique Arrow  Do you want [place] to follow her dofetide with you or are you good seeing her every 6 months for K./Mg/ECG

## 2016-11-26 NOTE — Telephone Encounter (Signed)
Nira Conn I guess its [place]-- I mean me :((((( 6 month from June

## 2016-11-26 NOTE — Telephone Encounter (Signed)
Called patient back. Put in recall for patient to see Dr. Johnsie Cancel in April. Schedule patient to see Dr. [name] towards the end of the year.

## 2016-11-30 DIAGNOSIS — M5137 Other intervertebral disc degeneration, lumbosacral region: Secondary | ICD-10-CM | POA: Diagnosis not present

## 2016-11-30 DIAGNOSIS — M9903 Segmental and somatic dysfunction of lumbar region: Secondary | ICD-10-CM | POA: Diagnosis not present

## 2016-11-30 DIAGNOSIS — M9902 Segmental and somatic dysfunction of thoracic region: Secondary | ICD-10-CM | POA: Diagnosis not present

## 2016-11-30 DIAGNOSIS — M9904 Segmental and somatic dysfunction of sacral region: Secondary | ICD-10-CM | POA: Diagnosis not present

## 2016-12-06 DIAGNOSIS — M9902 Segmental and somatic dysfunction of thoracic region: Secondary | ICD-10-CM | POA: Diagnosis not present

## 2016-12-06 DIAGNOSIS — M9903 Segmental and somatic dysfunction of lumbar region: Secondary | ICD-10-CM | POA: Diagnosis not present

## 2016-12-06 DIAGNOSIS — M9904 Segmental and somatic dysfunction of sacral region: Secondary | ICD-10-CM | POA: Diagnosis not present

## 2016-12-06 DIAGNOSIS — M5137 Other intervertebral disc degeneration, lumbosacral region: Secondary | ICD-10-CM | POA: Diagnosis not present

## 2016-12-07 DIAGNOSIS — I10 Essential (primary) hypertension: Secondary | ICD-10-CM | POA: Diagnosis not present

## 2016-12-07 DIAGNOSIS — I480 Paroxysmal atrial fibrillation: Secondary | ICD-10-CM | POA: Diagnosis not present

## 2016-12-09 DIAGNOSIS — M5137 Other intervertebral disc degeneration, lumbosacral region: Secondary | ICD-10-CM | POA: Diagnosis not present

## 2016-12-09 DIAGNOSIS — M9904 Segmental and somatic dysfunction of sacral region: Secondary | ICD-10-CM | POA: Diagnosis not present

## 2016-12-09 DIAGNOSIS — M9902 Segmental and somatic dysfunction of thoracic region: Secondary | ICD-10-CM | POA: Diagnosis not present

## 2016-12-09 DIAGNOSIS — M9903 Segmental and somatic dysfunction of lumbar region: Secondary | ICD-10-CM | POA: Diagnosis not present

## 2016-12-13 DIAGNOSIS — M9903 Segmental and somatic dysfunction of lumbar region: Secondary | ICD-10-CM | POA: Diagnosis not present

## 2016-12-13 DIAGNOSIS — M9902 Segmental and somatic dysfunction of thoracic region: Secondary | ICD-10-CM | POA: Diagnosis not present

## 2016-12-13 DIAGNOSIS — M9904 Segmental and somatic dysfunction of sacral region: Secondary | ICD-10-CM | POA: Diagnosis not present

## 2016-12-13 DIAGNOSIS — M5137 Other intervertebral disc degeneration, lumbosacral region: Secondary | ICD-10-CM | POA: Diagnosis not present

## 2016-12-15 DIAGNOSIS — M9902 Segmental and somatic dysfunction of thoracic region: Secondary | ICD-10-CM | POA: Diagnosis not present

## 2016-12-15 DIAGNOSIS — M9903 Segmental and somatic dysfunction of lumbar region: Secondary | ICD-10-CM | POA: Diagnosis not present

## 2016-12-15 DIAGNOSIS — M9904 Segmental and somatic dysfunction of sacral region: Secondary | ICD-10-CM | POA: Diagnosis not present

## 2016-12-15 DIAGNOSIS — M5137 Other intervertebral disc degeneration, lumbosacral region: Secondary | ICD-10-CM | POA: Diagnosis not present

## 2016-12-18 DIAGNOSIS — G4733 Obstructive sleep apnea (adult) (pediatric): Secondary | ICD-10-CM | POA: Diagnosis not present

## 2016-12-20 DIAGNOSIS — M9904 Segmental and somatic dysfunction of sacral region: Secondary | ICD-10-CM | POA: Diagnosis not present

## 2016-12-20 DIAGNOSIS — M9902 Segmental and somatic dysfunction of thoracic region: Secondary | ICD-10-CM | POA: Diagnosis not present

## 2016-12-20 DIAGNOSIS — M5137 Other intervertebral disc degeneration, lumbosacral region: Secondary | ICD-10-CM | POA: Diagnosis not present

## 2016-12-20 DIAGNOSIS — M9903 Segmental and somatic dysfunction of lumbar region: Secondary | ICD-10-CM | POA: Diagnosis not present

## 2016-12-23 DIAGNOSIS — M5137 Other intervertebral disc degeneration, lumbosacral region: Secondary | ICD-10-CM | POA: Diagnosis not present

## 2016-12-23 DIAGNOSIS — M9903 Segmental and somatic dysfunction of lumbar region: Secondary | ICD-10-CM | POA: Diagnosis not present

## 2016-12-23 DIAGNOSIS — M9904 Segmental and somatic dysfunction of sacral region: Secondary | ICD-10-CM | POA: Diagnosis not present

## 2016-12-23 DIAGNOSIS — M9902 Segmental and somatic dysfunction of thoracic region: Secondary | ICD-10-CM | POA: Diagnosis not present

## 2016-12-27 DIAGNOSIS — M5137 Other intervertebral disc degeneration, lumbosacral region: Secondary | ICD-10-CM | POA: Diagnosis not present

## 2016-12-27 DIAGNOSIS — M9903 Segmental and somatic dysfunction of lumbar region: Secondary | ICD-10-CM | POA: Diagnosis not present

## 2016-12-27 DIAGNOSIS — M9904 Segmental and somatic dysfunction of sacral region: Secondary | ICD-10-CM | POA: Diagnosis not present

## 2016-12-27 DIAGNOSIS — M9902 Segmental and somatic dysfunction of thoracic region: Secondary | ICD-10-CM | POA: Diagnosis not present

## 2016-12-30 DIAGNOSIS — M5137 Other intervertebral disc degeneration, lumbosacral region: Secondary | ICD-10-CM | POA: Diagnosis not present

## 2016-12-30 DIAGNOSIS — M9903 Segmental and somatic dysfunction of lumbar region: Secondary | ICD-10-CM | POA: Diagnosis not present

## 2016-12-30 DIAGNOSIS — M9902 Segmental and somatic dysfunction of thoracic region: Secondary | ICD-10-CM | POA: Diagnosis not present

## 2016-12-30 DIAGNOSIS — M9904 Segmental and somatic dysfunction of sacral region: Secondary | ICD-10-CM | POA: Diagnosis not present

## 2017-01-05 DIAGNOSIS — M9903 Segmental and somatic dysfunction of lumbar region: Secondary | ICD-10-CM | POA: Diagnosis not present

## 2017-01-05 DIAGNOSIS — M5137 Other intervertebral disc degeneration, lumbosacral region: Secondary | ICD-10-CM | POA: Diagnosis not present

## 2017-01-05 DIAGNOSIS — M9904 Segmental and somatic dysfunction of sacral region: Secondary | ICD-10-CM | POA: Diagnosis not present

## 2017-01-05 DIAGNOSIS — M9902 Segmental and somatic dysfunction of thoracic region: Secondary | ICD-10-CM | POA: Diagnosis not present

## 2017-01-11 ENCOUNTER — Ambulatory Visit: Payer: Medicare Other | Admitting: Cardiovascular Disease

## 2017-01-12 DIAGNOSIS — D239 Other benign neoplasm of skin, unspecified: Secondary | ICD-10-CM | POA: Diagnosis not present

## 2017-01-17 DIAGNOSIS — G4733 Obstructive sleep apnea (adult) (pediatric): Secondary | ICD-10-CM | POA: Diagnosis not present

## 2017-01-26 DIAGNOSIS — E1165 Type 2 diabetes mellitus with hyperglycemia: Secondary | ICD-10-CM | POA: Diagnosis not present

## 2017-01-26 DIAGNOSIS — I10 Essential (primary) hypertension: Secondary | ICD-10-CM | POA: Diagnosis not present

## 2017-02-07 DIAGNOSIS — H6122 Impacted cerumen, left ear: Secondary | ICD-10-CM | POA: Diagnosis not present

## 2017-02-07 DIAGNOSIS — H7292 Unspecified perforation of tympanic membrane, left ear: Secondary | ICD-10-CM | POA: Diagnosis not present

## 2017-02-08 DIAGNOSIS — E7849 Other hyperlipidemia: Secondary | ICD-10-CM | POA: Diagnosis not present

## 2017-02-08 DIAGNOSIS — E119 Type 2 diabetes mellitus without complications: Secondary | ICD-10-CM | POA: Diagnosis not present

## 2017-02-08 DIAGNOSIS — I480 Paroxysmal atrial fibrillation: Secondary | ICD-10-CM | POA: Diagnosis not present

## 2017-02-08 DIAGNOSIS — I10 Essential (primary) hypertension: Secondary | ICD-10-CM | POA: Diagnosis not present

## 2017-02-08 DIAGNOSIS — E1165 Type 2 diabetes mellitus with hyperglycemia: Secondary | ICD-10-CM | POA: Diagnosis not present

## 2017-02-15 DIAGNOSIS — E7849 Other hyperlipidemia: Secondary | ICD-10-CM | POA: Diagnosis not present

## 2017-02-15 DIAGNOSIS — E119 Type 2 diabetes mellitus without complications: Secondary | ICD-10-CM | POA: Diagnosis not present

## 2017-02-15 DIAGNOSIS — I10 Essential (primary) hypertension: Secondary | ICD-10-CM | POA: Diagnosis not present

## 2017-02-17 DIAGNOSIS — G4733 Obstructive sleep apnea (adult) (pediatric): Secondary | ICD-10-CM | POA: Diagnosis not present

## 2017-02-22 ENCOUNTER — Encounter: Payer: Self-pay | Admitting: Internal Medicine

## 2017-02-22 ENCOUNTER — Ambulatory Visit (INDEPENDENT_AMBULATORY_CARE_PROVIDER_SITE_OTHER): Payer: Medicare Other | Admitting: Internal Medicine

## 2017-02-22 VITALS — BP 122/64 | HR 52 | Ht 68.0 in | Wt 275.4 lb

## 2017-02-22 DIAGNOSIS — I481 Persistent atrial fibrillation: Secondary | ICD-10-CM | POA: Diagnosis not present

## 2017-02-22 DIAGNOSIS — I4819 Other persistent atrial fibrillation: Secondary | ICD-10-CM

## 2017-02-22 MED ORDER — DOFETILIDE 125 MCG PO CAPS: 375.0000 ug | ORAL_CAPSULE | Freq: Two times a day (BID) | ORAL | 3 refills | Status: DC

## 2017-02-22 NOTE — Progress Notes (Signed)
Patient Care Team: [name], MD as PCP - General (Internal Medicine)   HPI  Dominique Bauer is a 71 y.o. female Seen in follow-up for atrial fibrillation for which she was admitted for dofetilide initiation 1/18. She was seen in the A. fib clinic 2/18 feeling much better in sinus rhythm.  She has a history of persistent long-standing persistent atrial fibrillation for which cardioversion with dofetilide surprisingly effective. LV function was normal;   left atrial size is only modestly enlarged at 43  Date Cr K Mg Hgb  2/18  0.72 4.6 1.9   6/18 0.74 4.1 1.6    She has sleep apnea and is struggling to adjust to CPAP  She has had scant A. fib.  She denies bleeding issues.  She has trivial chronic edema.  She does have dyspnea on exertion.  She thinks this may be better than a year ago.  She has noted that there is been significant weight gain in the last year.  Past Medical History:  Diagnosis Date  . Body mass index 40.0-44.9, adult ([place])   . H/O measles   . H/O mumps   . H/O: whooping cough   . History of chicken pox   . Hypertension   . Persistent atrial fibrillation ([place])   . Sciatica   . Sleep apnea    not using aything at night right now/LH    Past Surgical History:  Procedure Laterality Date  . ABDOMINAL HYSTERECTOMY    . BREAST BIOPSY Right 02/01/2012  . CARDIOVERSION N/A 11/17/2015   Procedure: CARDIOVERSION;  Surgeon: [name], MD;  Location: [facility] ENDOSCOPY;  Service: Cardiovascular;  Laterality: N/A;  . CARDIOVERSION N/A 01/07/2016   Procedure: CARDIOVERSION;  Surgeon: [name], MD;  Location: [place];  Service: Cardiovascular;  Laterality: N/A;  . CARDIOVERSION N/A 04/29/2016   Procedure: CARDIOVERSION;  Surgeon: Sanda Klein, MD;  Location: [facility] ENDOSCOPY;  Service: Cardiovascular;  Laterality: N/A;  . DG  BONE DENSITY ([place] HX)    . INNER EAR SURGERY    . NASAL SEPTUM SURGERY      Current Outpatient Medications  Medication  Sig Dispense Refill  . acetaminophen (TYLENOL) 650 MG CR tablet Take 650 mg by mouth every 8 (eight) hours as needed for pain (arthritis).    Marland Kitchen albuterol (PROVENTIL HFA;VENTOLIN HFA) 108 (90 Base) MCG/ACT inhaler Inhale 2 puffs into the lungs every 6 (six) hours as needed for wheezing or shortness of breath.    Marland Kitchen apixaban (ELIQUIS) 5 MG TABS tablet Take 5 mg by mouth 2 (two) times daily.    . benazepril (LOTENSIN) 40 MG tablet Take 40 mg by mouth daily.    . calcium carbonate (CALCIUM 600) 1500 (600 Ca) MG TABS tablet Take 1,500 mg by mouth daily with breakfast.    . calcium carbonate (TUMS - DOSED IN MG ELEMENTAL CALCIUM) 500 MG chewable tablet Chew 1 tablet by mouth daily as needed for indigestion or heartburn.    . cholecalciferol (VITAMIN D) 1000 units tablet Take 1,000 Units by mouth daily.    Marland Kitchen dofetilide (TIKOSYN) 125 MCG capsule Take 3 capsules (375 mcg total) by mouth 2 (two) times daily. 540 capsule 3  . glimepiride (AMARYL) 2 MG tablet Take 2 mg by mouth daily.    Marland Kitchen JANUVIA 100 MG tablet Take 1 tablet by mouth daily.    Marland Kitchen L-LYSINE PO TAKE ONE TABLET BY MOUTH DAILY    . magnesium oxide (MAG-OX) 400  MG tablet Take 400 mg by mouth 2 (two) times daily.    . metoprolol tartrate (LOPRESSOR) 25 MG tablet Take 25mg  (1 tablet) in the AM and 12.5mg  (1/2 tablet) in the PM 225 tablet 3  . multivitamin-lutein (OCUVITE-LUTEIN) CAPS capsule Take 1 capsule by mouth daily.    . Omega-3 Fatty Acids (FISH OIL) 1000 MG CAPS Take 1,000 mg by mouth 3 (three) times daily.     Marland Kitchen triamcinolone cream (KENALOG) 0.1 % Apply 1 application topically daily as needed. For rash    . zinc gluconate 50 MG tablet Take 50 mg by mouth daily.     No current facility-administered medications for this visit.     Allergies  Allergen Reactions  . Penicillins Hives    severe  . Other     Cigarette smoke and certain perfumes - chest tightening and runny nose  . Neosporin [Neomycin-Bacitracin Zn-Polymyx] Rash       Review of Systems negative except from HPI and PMH  Physical Exam BP 122/64   Pulse (!) 52   Ht 5\' 8"  (1.727 m)   Wt 275 lb 6.4 oz (124.9 kg)   SpO2 97%   BMI 41.87 kg/m  Well developed and well nourished in no acute distress HENT normal E scleral and icterus clear Neck Supple JVP flat; carotids brisk and full Clear to ausculation Regular rate and rhythm, no murmurs gallops or rub Soft with active bowel sounds No clubbing cyanosis  Edema Alert and oriented, grossly normal motor and sensory function Skin Warm and Dry  ECG personally reviewed Sinus 56 20/09/44 LAD   Assessment and  Plan Afib persistent  OSA  Hypertension  Sinus Bradycardia  DOE  High Risk Medication Surveillance   Her dyspnea has multiple potential contributors.  First might be bradycardia chronotropic incompetence.  Another might be her obesity.  Third is deconditioning.  BP reasonably controlled  No intercurrent atrial fibrillation or flutter  On Anticoagulation;  No bleeding issues   HR went from 50->>>72 with walking  Will stop lopressor and encourage weight loss  Will check sotalol labs  Last Mag was low       Current medicines are reviewed at length with the patient today .  The patient does not  have concerns regarding medicines.

## 2017-02-22 NOTE — Patient Instructions (Addendum)
Medication Instructions: Your physician has recommended you make the following change in your medication:  -1) STOP Lopressor (Metoprolol Tartrate)   Labwork: Your physician has recommended that you have lab work today: BMET, CBC, and Magnesium level  Procedures/Testing: None Ordered  Follow-Up: Your physician wants you to follow-up in: 1 YEAR with Dr. [name].  You will receive a reminder letter in the mail two months in advance. If you don't receive a letter, please call our office to schedule the follow-up appointment.   If you need a refill on your cardiac medications before your next appointment, please call your pharmacy.

## 2017-02-23 LAB — CBC WITH DIFFERENTIAL/PLATELET
BASOS ABS: 0 10*3/uL (ref 0.0–0.2)
Basos: 0 %
EOS (ABSOLUTE): 0.1 10*3/uL (ref 0.0–0.4)
Eos: 1 %
HEMATOCRIT: 37.7 % (ref 34.0–46.6)
Hemoglobin: 13.2 g/dL (ref 11.1–15.9)
Immature Grans (Abs): 0.1 10*3/uL (ref 0.0–0.1)
Immature Granulocytes: 1 %
Lymphocytes Absolute: 5.1 10*3/uL — ABNORMAL HIGH (ref 0.7–3.1)
Lymphs: 50 %
MCH: 29.7 pg (ref 26.6–33.0)
MCHC: 35 g/dL (ref 31.5–35.7)
MCV: 85 fL (ref 79–97)
MONOS ABS: 0.5 10*3/uL (ref 0.1–0.9)
Monocytes: 5 %
NEUTROS PCT: 43 %
Neutrophils Absolute: 4.5 10*3/uL (ref 1.4–7.0)
PLATELETS: 252 10*3/uL (ref 150–379)
RBC: 4.45 x10E6/uL (ref 3.77–5.28)
RDW: 13.8 % (ref 12.3–15.4)
WBC: 10.3 10*3/uL (ref 3.4–10.8)

## 2017-02-23 LAB — BASIC METABOLIC PANEL
BUN/Creatinine Ratio: 24 (ref 12–28)
BUN: 19 mg/dL (ref 8–27)
CALCIUM: 10.8 mg/dL — AB (ref 8.7–10.3)
CHLORIDE: 101 mmol/L (ref 96–106)
CO2: 23 mmol/L (ref 20–29)
Creatinine, Ser: 0.8 mg/dL (ref 0.57–1.00)
GFR calc Af Amer: 86 mL/min/{1.73_m2} (ref >59)
GFR, EST NON AFRICAN AMERICAN: 74 mL/min/{1.73_m2} (ref >59)
GLUCOSE: 81 mg/dL (ref 65–99)
POTASSIUM: 4.6 mmol/L (ref 3.5–5.2)
SODIUM: 140 mmol/L (ref 134–144)

## 2017-02-23 LAB — MAGNESIUM: MAGNESIUM: 2.2 mg/dL (ref 1.6–2.3)

## 2017-03-01 ENCOUNTER — Telehealth: Payer: Self-pay

## 2017-03-01 DIAGNOSIS — R899 Unspecified abnormal finding in specimens from other organs, systems and tissues: Secondary | ICD-10-CM

## 2017-03-01 NOTE — Telephone Encounter (Signed)
-----   Message from Deboraha Sprang, MD sent at 02/27/2017 11:45 AM EST ----- Please Inform Patient that labs are normal x  1)  Her Calcium is elevated  Will need ionized calcium and then to followup with here PCP 2) she has chronic elevation in her lymphocytes this also will require followup by her PCP  Thanks

## 2017-03-01 NOTE — Telephone Encounter (Signed)
Pt is aware and agreeable to abnormal results. She is agreeable to coming in 12/13 for ionized calcium lab. I will forward resutls to PCP for review.

## 2017-03-05 DIAGNOSIS — J069 Acute upper respiratory infection, unspecified: Secondary | ICD-10-CM | POA: Diagnosis not present

## 2017-03-05 DIAGNOSIS — J029 Acute pharyngitis, unspecified: Secondary | ICD-10-CM | POA: Diagnosis not present

## 2017-03-10 ENCOUNTER — Other Ambulatory Visit: Payer: Medicare Other | Admitting: *Deleted

## 2017-03-10 DIAGNOSIS — E119 Type 2 diabetes mellitus without complications: Secondary | ICD-10-CM | POA: Diagnosis not present

## 2017-03-10 DIAGNOSIS — I10 Essential (primary) hypertension: Secondary | ICD-10-CM | POA: Diagnosis not present

## 2017-03-10 DIAGNOSIS — R899 Unspecified abnormal finding in specimens from other organs, systems and tissues: Secondary | ICD-10-CM

## 2017-03-10 DIAGNOSIS — E7849 Other hyperlipidemia: Secondary | ICD-10-CM | POA: Diagnosis not present

## 2017-03-11 LAB — CALCIUM, IONIZED: Calcium, Ion: 5.5 mg/dL (ref 4.5–5.6)

## 2017-04-06 DIAGNOSIS — M5137 Other intervertebral disc degeneration, lumbosacral region: Secondary | ICD-10-CM | POA: Diagnosis not present

## 2017-04-06 DIAGNOSIS — M9903 Segmental and somatic dysfunction of lumbar region: Secondary | ICD-10-CM | POA: Diagnosis not present

## 2017-04-06 DIAGNOSIS — M9902 Segmental and somatic dysfunction of thoracic region: Secondary | ICD-10-CM | POA: Diagnosis not present

## 2017-04-06 DIAGNOSIS — M9904 Segmental and somatic dysfunction of sacral region: Secondary | ICD-10-CM | POA: Diagnosis not present

## 2017-04-08 DIAGNOSIS — E7849 Other hyperlipidemia: Secondary | ICD-10-CM | POA: Diagnosis not present

## 2017-04-08 DIAGNOSIS — I10 Essential (primary) hypertension: Secondary | ICD-10-CM | POA: Diagnosis not present

## 2017-04-08 DIAGNOSIS — E119 Type 2 diabetes mellitus without complications: Secondary | ICD-10-CM | POA: Diagnosis not present

## 2017-04-08 DIAGNOSIS — I480 Paroxysmal atrial fibrillation: Secondary | ICD-10-CM | POA: Diagnosis not present

## 2017-04-13 DIAGNOSIS — H35363 Drusen (degenerative) of macula, bilateral: Secondary | ICD-10-CM | POA: Diagnosis not present

## 2017-04-20 DIAGNOSIS — M9903 Segmental and somatic dysfunction of lumbar region: Secondary | ICD-10-CM | POA: Diagnosis not present

## 2017-04-20 DIAGNOSIS — M9904 Segmental and somatic dysfunction of sacral region: Secondary | ICD-10-CM | POA: Diagnosis not present

## 2017-04-20 DIAGNOSIS — M9902 Segmental and somatic dysfunction of thoracic region: Secondary | ICD-10-CM | POA: Diagnosis not present

## 2017-04-20 DIAGNOSIS — M5137 Other intervertebral disc degeneration, lumbosacral region: Secondary | ICD-10-CM | POA: Diagnosis not present

## 2017-05-04 DIAGNOSIS — M5137 Other intervertebral disc degeneration, lumbosacral region: Secondary | ICD-10-CM | POA: Diagnosis not present

## 2017-05-04 DIAGNOSIS — M9903 Segmental and somatic dysfunction of lumbar region: Secondary | ICD-10-CM | POA: Diagnosis not present

## 2017-05-04 DIAGNOSIS — M9904 Segmental and somatic dysfunction of sacral region: Secondary | ICD-10-CM | POA: Diagnosis not present

## 2017-05-04 DIAGNOSIS — M9902 Segmental and somatic dysfunction of thoracic region: Secondary | ICD-10-CM | POA: Diagnosis not present

## 2017-05-09 DIAGNOSIS — E7849 Other hyperlipidemia: Secondary | ICD-10-CM | POA: Diagnosis not present

## 2017-05-09 DIAGNOSIS — E119 Type 2 diabetes mellitus without complications: Secondary | ICD-10-CM | POA: Diagnosis not present

## 2017-05-09 DIAGNOSIS — I480 Paroxysmal atrial fibrillation: Secondary | ICD-10-CM | POA: Diagnosis not present

## 2017-05-09 DIAGNOSIS — I10 Essential (primary) hypertension: Secondary | ICD-10-CM | POA: Diagnosis not present

## 2017-05-18 DIAGNOSIS — Z1389 Encounter for screening for other disorder: Secondary | ICD-10-CM | POA: Diagnosis not present

## 2017-05-18 DIAGNOSIS — I10 Essential (primary) hypertension: Secondary | ICD-10-CM | POA: Diagnosis not present

## 2017-05-18 DIAGNOSIS — I480 Paroxysmal atrial fibrillation: Secondary | ICD-10-CM | POA: Diagnosis not present

## 2017-05-18 DIAGNOSIS — E119 Type 2 diabetes mellitus without complications: Secondary | ICD-10-CM | POA: Diagnosis not present

## 2017-05-18 DIAGNOSIS — E7849 Other hyperlipidemia: Secondary | ICD-10-CM | POA: Diagnosis not present

## 2017-05-18 DIAGNOSIS — Z0000 Encounter for general adult medical examination without abnormal findings: Secondary | ICD-10-CM | POA: Diagnosis not present

## 2017-05-19 DIAGNOSIS — M9902 Segmental and somatic dysfunction of thoracic region: Secondary | ICD-10-CM | POA: Diagnosis not present

## 2017-05-19 DIAGNOSIS — M9904 Segmental and somatic dysfunction of sacral region: Secondary | ICD-10-CM | POA: Diagnosis not present

## 2017-05-19 DIAGNOSIS — M9903 Segmental and somatic dysfunction of lumbar region: Secondary | ICD-10-CM | POA: Diagnosis not present

## 2017-05-19 DIAGNOSIS — M5137 Other intervertebral disc degeneration, lumbosacral region: Secondary | ICD-10-CM | POA: Diagnosis not present

## 2017-05-20 ENCOUNTER — Other Ambulatory Visit: Payer: Self-pay | Admitting: Internal Medicine

## 2017-05-20 DIAGNOSIS — Z1231 Encounter for screening mammogram for malignant neoplasm of breast: Secondary | ICD-10-CM

## 2017-06-01 DIAGNOSIS — E119 Type 2 diabetes mellitus without complications: Secondary | ICD-10-CM | POA: Diagnosis not present

## 2017-06-01 DIAGNOSIS — I480 Paroxysmal atrial fibrillation: Secondary | ICD-10-CM | POA: Diagnosis not present

## 2017-06-01 DIAGNOSIS — I10 Essential (primary) hypertension: Secondary | ICD-10-CM | POA: Diagnosis not present

## 2017-06-01 DIAGNOSIS — E7849 Other hyperlipidemia: Secondary | ICD-10-CM | POA: Diagnosis not present

## 2017-06-02 DIAGNOSIS — M9903 Segmental and somatic dysfunction of lumbar region: Secondary | ICD-10-CM | POA: Diagnosis not present

## 2017-06-02 DIAGNOSIS — M9902 Segmental and somatic dysfunction of thoracic region: Secondary | ICD-10-CM | POA: Diagnosis not present

## 2017-06-02 DIAGNOSIS — M5137 Other intervertebral disc degeneration, lumbosacral region: Secondary | ICD-10-CM | POA: Diagnosis not present

## 2017-06-02 DIAGNOSIS — M9904 Segmental and somatic dysfunction of sacral region: Secondary | ICD-10-CM | POA: Diagnosis not present

## 2017-06-14 DIAGNOSIS — E119 Type 2 diabetes mellitus without complications: Secondary | ICD-10-CM | POA: Diagnosis not present

## 2017-06-14 DIAGNOSIS — Z833 Family history of diabetes mellitus: Secondary | ICD-10-CM | POA: Diagnosis not present

## 2017-06-14 DIAGNOSIS — Z7984 Long term (current) use of oral hypoglycemic drugs: Secondary | ICD-10-CM | POA: Diagnosis not present

## 2017-06-14 DIAGNOSIS — Z881 Allergy status to other antibiotic agents status: Secondary | ICD-10-CM | POA: Diagnosis not present

## 2017-06-14 DIAGNOSIS — Z86718 Personal history of other venous thrombosis and embolism: Secondary | ICD-10-CM | POA: Diagnosis not present

## 2017-06-14 DIAGNOSIS — K219 Gastro-esophageal reflux disease without esophagitis: Secondary | ICD-10-CM | POA: Diagnosis not present

## 2017-06-14 DIAGNOSIS — Z79899 Other long term (current) drug therapy: Secondary | ICD-10-CM | POA: Diagnosis not present

## 2017-06-14 DIAGNOSIS — Z8249 Family history of ischemic heart disease and other diseases of the circulatory system: Secondary | ICD-10-CM | POA: Diagnosis not present

## 2017-06-14 DIAGNOSIS — G4730 Sleep apnea, unspecified: Secondary | ICD-10-CM | POA: Diagnosis not present

## 2017-06-14 DIAGNOSIS — J4541 Moderate persistent asthma with (acute) exacerbation: Secondary | ICD-10-CM | POA: Diagnosis not present

## 2017-06-14 DIAGNOSIS — I10 Essential (primary) hypertension: Secondary | ICD-10-CM | POA: Diagnosis not present

## 2017-06-14 DIAGNOSIS — I4891 Unspecified atrial fibrillation: Secondary | ICD-10-CM | POA: Diagnosis not present

## 2017-06-14 DIAGNOSIS — E785 Hyperlipidemia, unspecified: Secondary | ICD-10-CM | POA: Diagnosis not present

## 2017-06-14 DIAGNOSIS — Z7901 Long term (current) use of anticoagulants: Secondary | ICD-10-CM | POA: Diagnosis not present

## 2017-06-14 DIAGNOSIS — R0602 Shortness of breath: Secondary | ICD-10-CM | POA: Diagnosis not present

## 2017-06-14 DIAGNOSIS — Z880 Allergy status to penicillin: Secondary | ICD-10-CM | POA: Diagnosis not present

## 2017-06-15 DIAGNOSIS — I480 Paroxysmal atrial fibrillation: Secondary | ICD-10-CM | POA: Diagnosis not present

## 2017-06-15 DIAGNOSIS — Z880 Allergy status to penicillin: Secondary | ICD-10-CM | POA: Diagnosis not present

## 2017-06-15 DIAGNOSIS — Z7901 Long term (current) use of anticoagulants: Secondary | ICD-10-CM | POA: Diagnosis not present

## 2017-06-15 DIAGNOSIS — Z86718 Personal history of other venous thrombosis and embolism: Secondary | ICD-10-CM | POA: Diagnosis not present

## 2017-06-15 DIAGNOSIS — Z881 Allergy status to other antibiotic agents status: Secondary | ICD-10-CM | POA: Diagnosis not present

## 2017-06-15 DIAGNOSIS — E7849 Other hyperlipidemia: Secondary | ICD-10-CM | POA: Diagnosis not present

## 2017-06-15 DIAGNOSIS — J4541 Moderate persistent asthma with (acute) exacerbation: Secondary | ICD-10-CM | POA: Diagnosis not present

## 2017-06-15 DIAGNOSIS — K219 Gastro-esophageal reflux disease without esophagitis: Secondary | ICD-10-CM | POA: Diagnosis not present

## 2017-06-15 DIAGNOSIS — G4730 Sleep apnea, unspecified: Secondary | ICD-10-CM | POA: Diagnosis not present

## 2017-06-15 DIAGNOSIS — Z79899 Other long term (current) drug therapy: Secondary | ICD-10-CM | POA: Diagnosis not present

## 2017-06-15 DIAGNOSIS — I10 Essential (primary) hypertension: Secondary | ICD-10-CM | POA: Diagnosis not present

## 2017-06-15 DIAGNOSIS — Z8249 Family history of ischemic heart disease and other diseases of the circulatory system: Secondary | ICD-10-CM | POA: Diagnosis not present

## 2017-06-15 DIAGNOSIS — Z833 Family history of diabetes mellitus: Secondary | ICD-10-CM | POA: Diagnosis not present

## 2017-06-15 DIAGNOSIS — E119 Type 2 diabetes mellitus without complications: Secondary | ICD-10-CM | POA: Diagnosis not present

## 2017-06-15 DIAGNOSIS — R0602 Shortness of breath: Secondary | ICD-10-CM | POA: Diagnosis not present

## 2017-06-15 DIAGNOSIS — E785 Hyperlipidemia, unspecified: Secondary | ICD-10-CM | POA: Diagnosis not present

## 2017-06-15 DIAGNOSIS — Z7984 Long term (current) use of oral hypoglycemic drugs: Secondary | ICD-10-CM | POA: Diagnosis not present

## 2017-06-15 DIAGNOSIS — I4891 Unspecified atrial fibrillation: Secondary | ICD-10-CM | POA: Diagnosis not present

## 2017-06-15 DIAGNOSIS — R05 Cough: Secondary | ICD-10-CM | POA: Diagnosis not present

## 2017-06-16 DIAGNOSIS — Z881 Allergy status to other antibiotic agents status: Secondary | ICD-10-CM | POA: Diagnosis not present

## 2017-06-16 DIAGNOSIS — Z7984 Long term (current) use of oral hypoglycemic drugs: Secondary | ICD-10-CM | POA: Diagnosis not present

## 2017-06-16 DIAGNOSIS — Z7901 Long term (current) use of anticoagulants: Secondary | ICD-10-CM | POA: Diagnosis not present

## 2017-06-16 DIAGNOSIS — I10 Essential (primary) hypertension: Secondary | ICD-10-CM | POA: Diagnosis not present

## 2017-06-16 DIAGNOSIS — Z880 Allergy status to penicillin: Secondary | ICD-10-CM | POA: Diagnosis not present

## 2017-06-16 DIAGNOSIS — Z8249 Family history of ischemic heart disease and other diseases of the circulatory system: Secondary | ICD-10-CM | POA: Diagnosis not present

## 2017-06-16 DIAGNOSIS — Z79899 Other long term (current) drug therapy: Secondary | ICD-10-CM | POA: Diagnosis not present

## 2017-06-16 DIAGNOSIS — Z833 Family history of diabetes mellitus: Secondary | ICD-10-CM | POA: Diagnosis not present

## 2017-06-16 DIAGNOSIS — G4730 Sleep apnea, unspecified: Secondary | ICD-10-CM | POA: Diagnosis not present

## 2017-06-16 DIAGNOSIS — J4541 Moderate persistent asthma with (acute) exacerbation: Secondary | ICD-10-CM | POA: Diagnosis not present

## 2017-06-16 DIAGNOSIS — E785 Hyperlipidemia, unspecified: Secondary | ICD-10-CM | POA: Diagnosis not present

## 2017-06-16 DIAGNOSIS — I4891 Unspecified atrial fibrillation: Secondary | ICD-10-CM | POA: Diagnosis not present

## 2017-06-16 DIAGNOSIS — K219 Gastro-esophageal reflux disease without esophagitis: Secondary | ICD-10-CM | POA: Diagnosis not present

## 2017-06-16 DIAGNOSIS — Z86718 Personal history of other venous thrombosis and embolism: Secondary | ICD-10-CM | POA: Diagnosis not present

## 2017-06-16 DIAGNOSIS — E119 Type 2 diabetes mellitus without complications: Secondary | ICD-10-CM | POA: Diagnosis not present

## 2017-06-27 DIAGNOSIS — I480 Paroxysmal atrial fibrillation: Secondary | ICD-10-CM | POA: Diagnosis not present

## 2017-06-27 DIAGNOSIS — J4520 Mild intermittent asthma, uncomplicated: Secondary | ICD-10-CM | POA: Diagnosis not present

## 2017-07-01 DIAGNOSIS — J4520 Mild intermittent asthma, uncomplicated: Secondary | ICD-10-CM | POA: Diagnosis not present

## 2017-07-01 DIAGNOSIS — I480 Paroxysmal atrial fibrillation: Secondary | ICD-10-CM | POA: Diagnosis not present

## 2017-07-01 DIAGNOSIS — I10 Essential (primary) hypertension: Secondary | ICD-10-CM | POA: Diagnosis not present

## 2017-07-07 DIAGNOSIS — R1084 Generalized abdominal pain: Secondary | ICD-10-CM | POA: Diagnosis not present

## 2017-07-07 DIAGNOSIS — K7689 Other specified diseases of liver: Secondary | ICD-10-CM | POA: Diagnosis not present

## 2017-07-07 DIAGNOSIS — R932 Abnormal findings on diagnostic imaging of liver and biliary tract: Secondary | ICD-10-CM | POA: Diagnosis not present

## 2017-07-08 ENCOUNTER — Ambulatory Visit
Admission: RE | Admit: 2017-07-08 | Discharge: 2017-07-08 | Disposition: A | Discharge status: Home / Self Care | Payer: Medicare Other | Source: Ambulatory Visit | Attending: Internal Medicine | Admitting: Internal Medicine

## 2017-07-08 ENCOUNTER — Ambulatory Visit: Payer: Medicare Other

## 2017-07-08 DIAGNOSIS — Z1231 Encounter for screening mammogram for malignant neoplasm of breast: Secondary | ICD-10-CM | POA: Diagnosis not present

## 2017-07-11 ENCOUNTER — Other Ambulatory Visit: Payer: Self-pay | Admitting: Nurse Practitioner

## 2017-07-17 NOTE — Progress Notes (Signed)
Cardiology Office Note:    Date:  07/18/2017   ID:  Dominique Bauer, DOB 1946-03-03, MRN [id number]  PCP:  [name], MD  Cardiologist:  Dr. Jenkins Rouge   Electrophysiologist:  n/a  Referring MD: [name], MD   No chief complaint on file.   History of Present Illness:    72 y.o. with PAF. History of HTN and OSA. Failed DCC alone and on flecainide 01/07/16. Subsequently begun on Tikosyn in hospital with successful [facility] 04/29/16.  Last seen by Dr [name] 02/22/17 and beta blocker stopped due to relative bradycardia   CHADS2-VASc=3 (female, 72 yo, HTN).     Having issues with CPAP for her OSA and seeing Dr Claiborne Billings In hospital with flu and had 4 respiratory infections this winter/spring On prednisone some Januvia d/c to to risk of infection  During this time primary put her back on beta blocker for ? PVC;s   Still with some fatigue and exertional dyspnea   Prior CV studies that were reviewed today include:    ETT 12/04/15 Blood pressure demonstrated a normal response to exercise. There was no ST segment deviation noted during stress. Negative, adequate stress test.  Echo 10/16 [facility]) Mild to mod conc LVH, EF 55-60, mild to mod LAE, mild aortic sclerosis, trace MR  Past Medical History:  Diagnosis Date  . Body mass index 40.0-44.9, adult ([place])   . H/O measles   . H/O mumps   . H/O: whooping cough   . History of chicken pox   . Hypertension   . Persistent atrial fibrillation ([place])   . Sciatica   . Sleep apnea    not using aything at night right now/LH    Past Surgical History:  Procedure Laterality Date  . ABDOMINAL HYSTERECTOMY    . BREAST BIOPSY Right 02/01/2012  . BREAST EXCISIONAL BIOPSY Bilateral    benign  . CARDIOVERSION N/A 11/17/2015   Procedure: CARDIOVERSION;  Surgeon: [name], MD;  [facility] ENDOSCOPY;  Service: Cardiovascular;  Laterality: N/A;  . CARDIOVERSION N/A 01/07/2016   Procedure: CARDIOVERSION;  Surgeon: [name], MD;  Location: [place];  Service: Cardiovascular;  Laterality: N/A;  . CARDIOVERSION N/A 04/29/2016   Procedure: CARDIOVERSION;  Surgeon: Sanda Klein, MD;  Location: [facility] ENDOSCOPY;  Service: Cardiovascular;  Laterality: N/A;  . DG  BONE DENSITY ([place] HX)    . INNER EAR SURGERY    . NASAL SEPTUM SURGERY      Current Medications: Current Meds  Medication Sig  . acetaminophen (TYLENOL) 650 MG CR tablet Take 650 mg by mouth every 8 (eight) hours as needed for pain (arthritis).  Marland Kitchen apixaban (ELIQUIS) 5 MG TABS tablet Take 5 mg by mouth 2 (two) times daily.  . benazepril (LOTENSIN) 40 MG tablet Take 40 mg by mouth daily.  . calcium carbonate (CALCIUM 600) 1500 (600 Ca) MG TABS tablet Take 1,500 mg by mouth daily with breakfast.  . calcium carbonate (TUMS - DOSED IN MG ELEMENTAL CALCIUM) 500 MG chewable tablet Chew 1 tablet by mouth daily as needed for indigestion or heartburn.  . cholecalciferol (VITAMIN D) 1000 units tablet Take 1,000 Units by mouth daily.  Marland Kitchen dofetilide (TIKOSYN) 125 MCG capsule Take 3 capsules (375 mcg total) by mouth 2 (two) times daily.  Marland Kitchen glimepiride (AMARYL) 2 MG tablet Take 2 mg by mouth as directed.  . L-LYSINE PO TAKE ONE TABLET BY MOUTH DAILY  . magnesium oxide (MAG-OX) 400 MG tablet Take 1  tablet (400 mg total) by mouth 2 (two) times daily.  Marland Kitchen METOPROLOL TARTRATE PO Take 25 mg by mouth 2 (two) times daily.  . multivitamin-lutein (OCUVITE-LUTEIN) CAPS capsule Take 1 capsule by mouth daily.  . Omega-3 Fatty Acids (FISH OIL) 1000 MG CAPS Take 1,000 mg by mouth 3 (three) times daily.   Marland Kitchen triamcinolone cream (KENALOG) 0.1 % Apply 1 application topically daily as needed. For rash  . zinc gluconate 50 MG tablet Take 50 mg by mouth daily.     Allergies:   Penicillins; Other; and Neosporin [neomycin-bacitracin zn-polymyx]   Social History   Socioeconomic History  . Marital status: Married    Spouse name: Not on file  . Number of children: Not on file  .  Years of education: Not on file  . Highest education level: Not on file  Occupational History  . Not on file  Social Needs  . Financial resource strain: Not on file  . Food insecurity:    Worry: Not on file    Inability: Not on file  . Transportation needs:    Medical: Not on file    Non-medical: Not on file  Tobacco Use  . Smoking status: Never Smoker  . Smokeless tobacco: Never Used  Substance and Sexual Activity  . Alcohol use: No  . Drug use: No  . Sexual activity: Not on file  Lifestyle  . Physical activity:    Days per week: Not on file    Minutes per session: Not on file  . Stress: Not on file  Relationships  . Social connections:    Talks on phone: Not on file    Gets together: Not on file    Attends religious service: Not on file    Active member of club or organization: Not on file    Attends meetings of clubs or organizations: Not on file    Relationship status: Not on file  Other Topics Concern  . 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     Family History:  The patient's family history includes Breast cancer in her maternal aunt and sister; Hypertension in her mother.   ROS:   Please see the history of present illness.    Review of Systems  Constitution: Positive for diaphoresis and malaise/fatigue.  Cardiovascular: Positive for dyspnea on exertion and irregular heartbeat.  Respiratory: Positive for snoring.   Skin: Positive for rash.  Musculoskeletal: Positive for back pain, joint swelling and myalgias.  Neurological: Positive for dizziness and headaches.   All other systems reviewed and are negative.   EKGs/Labs/Other Test Reviewed:    EKG:   07/09/16 SR rate 58 QT 438  Recent Labs: 02/22/2017: BUN 19; Creatinine, Ser 0.80; Hemoglobin 13.2; Magnesium 2.2; Platelets 252; Potassium 4.6; Sodium 140   Recent Lipid Panel No results found for: CHOL, TRIG, HDL, CHOLHDL, VLDL, LDLCALC, LDLDIRECT   Physical Exam:     VS:  BP 128/76   Pulse 63   Ht 5\' 8"  (1.727 m)   Wt 282 lb 4 oz (128 kg)   SpO2 97%   BMI 42.92 kg/m     Wt Readings from Last 3 Encounters:  07/18/17 282 lb 4 oz (128 kg)  02/22/17 275 lb 6.4 oz (124.9 kg)  10/26/16 262 lb (118.8 kg)     Affect appropriate Overweight white female  HEENT: normal Neck supple with no adenopathy JVP normal no bruits no thyromegaly Lungs clear with no wheezing  and good diaphragmatic motion Heart:  S1/S2 no murmur, no rub, gallop or click PMI normal Abdomen: benighn, BS positve, no tenderness, no AAA no bruit.  No HSM or HJR Distal pulses intact with no bruits No edema Neuro non-focal Skin warm and dry No muscular weakness   ASSESSMENT:    1. Dyspnea, unspecified type    PLAN:    In order of problems listed above:  1. PAF:  Maintaining NSR on Tikosyn labs and QT ok f/u Dr [name]   2. HTN - Well controlled.  Continue current medications and low sodium Dash type diet.     3. OSA - F/U with Dr Claiborne Billings regarding issues with CPAP  4. DM:  januvia d/c by primary Consider Jardiance for cardioprotective effect  5. Dyspnea:  Likely from obesity and chronic bronchitis/flu she has had f/u echo to assess RV/LV function   Jenkins Rouge

## 2017-07-18 ENCOUNTER — Ambulatory Visit: Payer: Medicare Other | Admitting: Cardiovascular Disease

## 2017-07-18 ENCOUNTER — Encounter: Payer: Self-pay | Admitting: Cardiovascular Disease

## 2017-07-18 VITALS — BP 128/76 | HR 63 | Ht 68.0 in | Wt 282.2 lb

## 2017-07-18 DIAGNOSIS — R0600 Dyspnea, unspecified: Secondary | ICD-10-CM | POA: Diagnosis not present

## 2017-07-18 NOTE — Patient Instructions (Addendum)
Medication Instructions:  Your physician recommends that you continue on your current medications as directed. Please refer to the Current Medication list given to you today.   Labwork: None ordered  Testing/Procedures: Your physician has requested that you have an echocardiogram. Echocardiography is a painless test that uses sound waves to create images of your heart. It provides your doctor with information about the size and shape of your heart and how well your heart's chambers and valves are working. This procedure takes approximately one hour. There are no restrictions for this procedure.  Follow-Up: Your physician wants you to follow-up in: 6 months with Dr. Johnsie Cancel. You will receive a reminder letter in the mail two months in advance. If you don't receive a letter, please call our office to schedule the follow-up appointment.   Any Other Special Instructions Will Be Listed Below (If Applicable).     If you need a refill on your cardiac medications before your next appointment, please call your pharmacy.

## 2017-07-20 ENCOUNTER — Ambulatory Visit (HOSPITAL_COMMUNITY): Payer: Medicare Other | Attending: Cardiovascular Disease

## 2017-07-20 ENCOUNTER — Other Ambulatory Visit: Payer: Self-pay

## 2017-07-20 DIAGNOSIS — R0600 Dyspnea, unspecified: Secondary | ICD-10-CM | POA: Diagnosis not present

## 2017-07-20 DIAGNOSIS — E119 Type 2 diabetes mellitus without complications: Secondary | ICD-10-CM | POA: Diagnosis not present

## 2017-07-20 DIAGNOSIS — I4891 Unspecified atrial fibrillation: Secondary | ICD-10-CM | POA: Diagnosis not present

## 2017-07-20 DIAGNOSIS — E669 Obesity, unspecified: Secondary | ICD-10-CM | POA: Insufficient documentation

## 2017-07-20 DIAGNOSIS — Z6841 Body Mass Index (BMI) 40.0 and over, adult: Secondary | ICD-10-CM | POA: Insufficient documentation

## 2017-07-27 DIAGNOSIS — M9904 Segmental and somatic dysfunction of sacral region: Secondary | ICD-10-CM | POA: Diagnosis not present

## 2017-07-27 DIAGNOSIS — M9902 Segmental and somatic dysfunction of thoracic region: Secondary | ICD-10-CM | POA: Diagnosis not present

## 2017-07-27 DIAGNOSIS — M9903 Segmental and somatic dysfunction of lumbar region: Secondary | ICD-10-CM | POA: Diagnosis not present

## 2017-07-27 DIAGNOSIS — M5137 Other intervertebral disc degeneration, lumbosacral region: Secondary | ICD-10-CM | POA: Diagnosis not present

## 2017-08-02 DIAGNOSIS — H6122 Impacted cerumen, left ear: Secondary | ICD-10-CM | POA: Diagnosis not present

## 2017-08-02 DIAGNOSIS — H7292 Unspecified perforation of tympanic membrane, left ear: Secondary | ICD-10-CM | POA: Diagnosis not present

## 2017-08-04 DIAGNOSIS — M9902 Segmental and somatic dysfunction of thoracic region: Secondary | ICD-10-CM | POA: Diagnosis not present

## 2017-08-04 DIAGNOSIS — M5137 Other intervertebral disc degeneration, lumbosacral region: Secondary | ICD-10-CM | POA: Diagnosis not present

## 2017-08-04 DIAGNOSIS — M9903 Segmental and somatic dysfunction of lumbar region: Secondary | ICD-10-CM | POA: Diagnosis not present

## 2017-08-04 DIAGNOSIS — M9904 Segmental and somatic dysfunction of sacral region: Secondary | ICD-10-CM | POA: Diagnosis not present

## 2017-08-17 DIAGNOSIS — M9902 Segmental and somatic dysfunction of thoracic region: Secondary | ICD-10-CM | POA: Diagnosis not present

## 2017-08-17 DIAGNOSIS — M9903 Segmental and somatic dysfunction of lumbar region: Secondary | ICD-10-CM | POA: Diagnosis not present

## 2017-08-17 DIAGNOSIS — M9904 Segmental and somatic dysfunction of sacral region: Secondary | ICD-10-CM | POA: Diagnosis not present

## 2017-08-17 DIAGNOSIS — M5137 Other intervertebral disc degeneration, lumbosacral region: Secondary | ICD-10-CM | POA: Diagnosis not present

## 2017-08-31 DIAGNOSIS — M5137 Other intervertebral disc degeneration, lumbosacral region: Secondary | ICD-10-CM | POA: Diagnosis not present

## 2017-08-31 DIAGNOSIS — M9902 Segmental and somatic dysfunction of thoracic region: Secondary | ICD-10-CM | POA: Diagnosis not present

## 2017-08-31 DIAGNOSIS — M9904 Segmental and somatic dysfunction of sacral region: Secondary | ICD-10-CM | POA: Diagnosis not present

## 2017-08-31 DIAGNOSIS — M9903 Segmental and somatic dysfunction of lumbar region: Secondary | ICD-10-CM | POA: Diagnosis not present

## 2017-09-06 DIAGNOSIS — I480 Paroxysmal atrial fibrillation: Secondary | ICD-10-CM | POA: Diagnosis not present

## 2017-09-06 DIAGNOSIS — J4520 Mild intermittent asthma, uncomplicated: Secondary | ICD-10-CM | POA: Diagnosis not present

## 2017-09-06 DIAGNOSIS — I10 Essential (primary) hypertension: Secondary | ICD-10-CM | POA: Diagnosis not present

## 2017-09-14 DIAGNOSIS — M9904 Segmental and somatic dysfunction of sacral region: Secondary | ICD-10-CM | POA: Diagnosis not present

## 2017-09-14 DIAGNOSIS — M5137 Other intervertebral disc degeneration, lumbosacral region: Secondary | ICD-10-CM | POA: Diagnosis not present

## 2017-09-14 DIAGNOSIS — M9902 Segmental and somatic dysfunction of thoracic region: Secondary | ICD-10-CM | POA: Diagnosis not present

## 2017-09-14 DIAGNOSIS — M9903 Segmental and somatic dysfunction of lumbar region: Secondary | ICD-10-CM | POA: Diagnosis not present

## 2017-09-26 DIAGNOSIS — J4520 Mild intermittent asthma, uncomplicated: Secondary | ICD-10-CM | POA: Diagnosis not present

## 2017-09-26 DIAGNOSIS — I480 Paroxysmal atrial fibrillation: Secondary | ICD-10-CM | POA: Diagnosis not present

## 2017-09-28 DIAGNOSIS — M9903 Segmental and somatic dysfunction of lumbar region: Secondary | ICD-10-CM | POA: Diagnosis not present

## 2017-09-28 DIAGNOSIS — M9902 Segmental and somatic dysfunction of thoracic region: Secondary | ICD-10-CM | POA: Diagnosis not present

## 2017-09-28 DIAGNOSIS — M5137 Other intervertebral disc degeneration, lumbosacral region: Secondary | ICD-10-CM | POA: Diagnosis not present

## 2017-09-28 DIAGNOSIS — M9904 Segmental and somatic dysfunction of sacral region: Secondary | ICD-10-CM | POA: Diagnosis not present

## 2017-10-05 DIAGNOSIS — I10 Essential (primary) hypertension: Secondary | ICD-10-CM | POA: Diagnosis not present

## 2017-10-05 DIAGNOSIS — J4520 Mild intermittent asthma, uncomplicated: Secondary | ICD-10-CM | POA: Diagnosis not present

## 2017-10-05 DIAGNOSIS — I480 Paroxysmal atrial fibrillation: Secondary | ICD-10-CM | POA: Diagnosis not present

## 2017-10-12 DIAGNOSIS — M9902 Segmental and somatic dysfunction of thoracic region: Secondary | ICD-10-CM | POA: Diagnosis not present

## 2017-10-12 DIAGNOSIS — M9904 Segmental and somatic dysfunction of sacral region: Secondary | ICD-10-CM | POA: Diagnosis not present

## 2017-10-12 DIAGNOSIS — M9903 Segmental and somatic dysfunction of lumbar region: Secondary | ICD-10-CM | POA: Diagnosis not present

## 2017-10-12 DIAGNOSIS — M5137 Other intervertebral disc degeneration, lumbosacral region: Secondary | ICD-10-CM | POA: Diagnosis not present

## 2017-11-16 DIAGNOSIS — M5137 Other intervertebral disc degeneration, lumbosacral region: Secondary | ICD-10-CM | POA: Diagnosis not present

## 2017-11-16 DIAGNOSIS — M9903 Segmental and somatic dysfunction of lumbar region: Secondary | ICD-10-CM | POA: Diagnosis not present

## 2017-11-16 DIAGNOSIS — J45991 Cough variant asthma: Secondary | ICD-10-CM | POA: Diagnosis not present

## 2017-11-16 DIAGNOSIS — M9902 Segmental and somatic dysfunction of thoracic region: Secondary | ICD-10-CM | POA: Diagnosis not present

## 2017-11-16 DIAGNOSIS — M9904 Segmental and somatic dysfunction of sacral region: Secondary | ICD-10-CM | POA: Diagnosis not present

## 2017-11-17 DIAGNOSIS — I10 Essential (primary) hypertension: Secondary | ICD-10-CM | POA: Diagnosis not present

## 2017-11-17 DIAGNOSIS — I480 Paroxysmal atrial fibrillation: Secondary | ICD-10-CM | POA: Diagnosis not present

## 2017-11-30 DIAGNOSIS — M9902 Segmental and somatic dysfunction of thoracic region: Secondary | ICD-10-CM | POA: Diagnosis not present

## 2017-11-30 DIAGNOSIS — M5137 Other intervertebral disc degeneration, lumbosacral region: Secondary | ICD-10-CM | POA: Diagnosis not present

## 2017-11-30 DIAGNOSIS — M9903 Segmental and somatic dysfunction of lumbar region: Secondary | ICD-10-CM | POA: Diagnosis not present

## 2017-11-30 DIAGNOSIS — M9904 Segmental and somatic dysfunction of sacral region: Secondary | ICD-10-CM | POA: Diagnosis not present

## 2017-12-14 DIAGNOSIS — M9904 Segmental and somatic dysfunction of sacral region: Secondary | ICD-10-CM | POA: Diagnosis not present

## 2017-12-14 DIAGNOSIS — M9903 Segmental and somatic dysfunction of lumbar region: Secondary | ICD-10-CM | POA: Diagnosis not present

## 2017-12-14 DIAGNOSIS — M5137 Other intervertebral disc degeneration, lumbosacral region: Secondary | ICD-10-CM | POA: Diagnosis not present

## 2017-12-14 DIAGNOSIS — M9902 Segmental and somatic dysfunction of thoracic region: Secondary | ICD-10-CM | POA: Diagnosis not present

## 2017-12-28 DIAGNOSIS — M9902 Segmental and somatic dysfunction of thoracic region: Secondary | ICD-10-CM | POA: Diagnosis not present

## 2017-12-28 DIAGNOSIS — M9904 Segmental and somatic dysfunction of sacral region: Secondary | ICD-10-CM | POA: Diagnosis not present

## 2017-12-28 DIAGNOSIS — M5137 Other intervertebral disc degeneration, lumbosacral region: Secondary | ICD-10-CM | POA: Diagnosis not present

## 2017-12-28 DIAGNOSIS — M9903 Segmental and somatic dysfunction of lumbar region: Secondary | ICD-10-CM | POA: Diagnosis not present

## 2018-01-12 DIAGNOSIS — M9902 Segmental and somatic dysfunction of thoracic region: Secondary | ICD-10-CM | POA: Diagnosis not present

## 2018-01-12 DIAGNOSIS — M9903 Segmental and somatic dysfunction of lumbar region: Secondary | ICD-10-CM | POA: Diagnosis not present

## 2018-01-12 DIAGNOSIS — M9904 Segmental and somatic dysfunction of sacral region: Secondary | ICD-10-CM | POA: Diagnosis not present

## 2018-01-12 DIAGNOSIS — M5137 Other intervertebral disc degeneration, lumbosacral region: Secondary | ICD-10-CM | POA: Diagnosis not present

## 2018-01-19 ENCOUNTER — Encounter: Payer: Self-pay | Admitting: Cardiovascular Disease

## 2018-01-19 ENCOUNTER — Ambulatory Visit: Payer: Medicare Other | Admitting: Cardiovascular Disease

## 2018-01-19 VITALS — BP 120/68 | HR 56 | Ht 68.0 in | Wt 276.0 lb

## 2018-01-19 DIAGNOSIS — I480 Paroxysmal atrial fibrillation: Secondary | ICD-10-CM | POA: Diagnosis not present

## 2018-01-19 DIAGNOSIS — G4733 Obstructive sleep apnea (adult) (pediatric): Secondary | ICD-10-CM | POA: Diagnosis not present

## 2018-01-19 DIAGNOSIS — R001 Bradycardia, unspecified: Secondary | ICD-10-CM | POA: Diagnosis not present

## 2018-01-19 DIAGNOSIS — I10 Essential (primary) hypertension: Secondary | ICD-10-CM

## 2018-01-19 DIAGNOSIS — Z9989 Dependence on other enabling machines and devices: Secondary | ICD-10-CM

## 2018-01-19 NOTE — Patient Instructions (Signed)
Follow-Up: At [facility], you and your health needs are our priority.  As part of our continuing mission to provide you with exceptional heart care, we have created designated Provider Care Teams.  These Care Teams include your primary Cardiologist (physician) and Advanced Practice Providers (APPs -  Physician Assistants and Nurse Practitioners) who all work together to provide you with the care you need, when you need it. . 1 year with Dr. Kelly (sleep clinic)    

## 2018-01-19 NOTE — Progress Notes (Signed)
Cardiology Office Note    Date:  01/19/2018   ID:  [name], DOB February 03, 1946, MRN [id number]  PCP:  Dominique Burly, MD  Cardiologist:  Dominique Majestic, MD (sleep), Dominique Bauer, D. Caryl Bauer (EP)  F/U sleep evaluation  History of Present Illness:  Dominique Bauer is a 72 y.o. female who presents for a 15 month follow-up sleep evaluation.  Dominique Bauer is a patient of Dominique Bauer and and Dominique Bauer who has a history of hypertension, and atrial fibrillation.  She had a long history of persistent atrial fibrillation and had failed previous antiarrhythmic therapy and cardioversions, but recently underwent successful cardioversion with dofetilide.  Her left atrial size is mildly enlarged at 43 mm.  Due to concerns for sleep apnea she was referred for diagnostic sleep study which was done on 02/15/2016.  This revealed severe obstructive sleep apnea with an AHI of 31.2 per hour.  Sleep apnea was more severe during Rehm sleep with an AHI of 58.3 per hour and with supine position at 46.1 per hour.  She had significant oxygen desaturation to a nadir of 69% on her diagnostic study.  She socially underwent a CPAP titration trial on 05/05/2016.  It was initially recommended that she use CPAP auto with a minimum pressure of 10 and maximum pressure of 18 cm water pressure with heated humidification.  She was unable to tolerate the high pressures and ultimately this was reduced to a maximum pressure of 16.  She has been using a fullface mask.  A download was obtained from 06/21/2016 through 07/20/2016.  This demonstrated excellent compliance with 100% usage stays at 97% of days with usage greater than 4 hours.  However, she was only sleeping 4 hours and 30 minutes.  There was no significant leak.  Her 95th percentile pressure was 13.4 with a maximum average pressure of 14.2. Upon further questioning, she states that she typically goes to bed between 11 PM and midnight and often is in bed until 10 AM in the morning.   When I saw her, she was not using CPAP through the duration of her time in bed.  She felt improved since initiating CPAP.   I last saw her in July 2018 and prior to that visit she was having difficulty with her mask with tenderness on the bridge of her nose.  At that time, I also had significant discussion with her and recommended the new Respironics dream were full face mask.  Apparently, [facility]  At the time they did not have this new mask which had just become available.  Over the past several months, she has significantly increased her sleep duration with CPAP use.  I obtained a new download in the office today from July 1 2 10/25/2016.  She was100% compliant with usage stays and uses greater than 4 hours and is averaging 6 hours and 57 minutes of CPAP use per night.  She is on a air since 10 AutoSet ResMed system and has a minimum pressure setting at 10 and maximum of 14.  Her 95th percent.  Average pressure was 13.2.  At times she still having increased leak around her nasal bridge.  Her AHI was 1.1.  She was been unaware of any recurrent atrial fibrillation.    Since I last saw her, she has continued to use CPAP with 100% compliance.  I obtained a new download from September 22 through January 16, 2018 which confirms 100% compliance.  Her ResMed air sense auto set is set at a minimum pressure of 10 with maximum of 14.  95th percentile pressure is 11.6 with a maximum average pressure of 12.1.  AHI remains excellent at 0.5.  She feels well.  She denies any residual daytime sleepiness.  A new Epworth scale was calculated in the office today and this endorsed at 7.  She is unaware of breakthrough snoring.  She denies any restless legs.  She denies bruxism, restless legs, hypnagogic hallucinations or cataplexy.  She presents for reevaluation.  Past Medical History:  Diagnosis Date  . Body mass index 40.0-44.9, adult ([place])   . H/O measles   . H/O mumps   . H/O:  whooping cough   . History of chicken pox   . Hypertension   . Persistent atrial fibrillation   . Sciatica   . Sleep apnea    not using aything at night right now/LH    Past Surgical History:  Procedure Laterality Date  . ABDOMINAL HYSTERECTOMY    . BREAST BIOPSY Right 02/01/2012  . BREAST EXCISIONAL BIOPSY Bilateral    benign  . CARDIOVERSION N/A 11/17/2015   Procedure: CARDIOVERSION;  Surgeon: [name], MD;  Location: [facility] ENDOSCOPY;  Service: Cardiovascular;  Laterality: N/A;  . CARDIOVERSION N/A 01/07/2016   Procedure: CARDIOVERSION;  Surgeon: [name], MD;  Location: [place];  Service: Cardiovascular;  Laterality: N/A;  . CARDIOVERSION N/A 04/29/2016   Procedure: CARDIOVERSION;  Surgeon: Sanda Klein, MD;  Location: [facility] ENDOSCOPY;  Service: Cardiovascular;  Laterality: N/A;  . DG  BONE DENSITY ([place] HX)    . INNER EAR SURGERY    . NASAL SEPTUM SURGERY      Current Medications: Outpatient Medications Prior to Visit  Medication Sig Dispense Refill  . acetaminophen (TYLENOL) 650 MG CR tablet Take 650 mg by mouth every 8 (eight) hours as needed for pain (arthritis).    Marland Kitchen apixaban (ELIQUIS) 5 MG TABS tablet Take 5 mg by mouth 2 (two) times daily.    . benazepril (LOTENSIN) 40 MG tablet Take 40 mg by mouth daily.    Marland Kitchen BREO ELLIPTA 200-25 MCG/INH AEPB Inhale 1 puff into the lungs every morning. At the same time each day  0  . calcium carbonate (CALCIUM 600) 1500 (600 Ca) MG TABS tablet Take 1,500 mg by mouth daily with breakfast.    . calcium carbonate (TUMS - DOSED IN MG ELEMENTAL CALCIUM) 500 MG chewable tablet Chew 1 tablet by mouth daily as needed for indigestion or heartburn.    . dofetilide (TIKOSYN) 125 MCG capsule Take 3 capsules (375 mcg total) by mouth 2 (two) times daily. 540 capsule 3  . fluticasone (FLONASE) 50 MCG/ACT nasal spray Place 1 spray into both nostrils daily.    Marland Kitchen glimepiride (AMARYL) 2 MG tablet Take 2 mg by mouth as directed. Taking 2 times  daily    . L-LYSINE PO TAKE ONE TABLET BY MOUTH DAILY    . magnesium oxide (MAG-OX) 400 MG tablet Take 1 tablet (400 mg total) by mouth 2 (two) times daily. 180 tablet 1  . metoprolol tartrate (LOPRESSOR) 25 MG tablet Take 25 mg by mouth. 1 tablet in the morning and 1/2 tablet in the evening    . multivitamin-lutein (OCUVITE-LUTEIN) CAPS capsule Take 1 capsule by mouth daily.    . Omega-3 Fatty Acids (FISH OIL) 1000 MG CAPS Take 1,000 mg by mouth 3 (three) times daily.     Marland Kitchen triamcinolone cream (KENALOG) 0.1 %  Apply 1 application topically daily as needed. For rash    . zinc gluconate 50 MG tablet Take 50 mg by mouth daily.    . cholecalciferol (VITAMIN D) 1000 units tablet Take 1,000 Units by mouth daily.    Marland Kitchen METOPROLOL TARTRATE PO Take 25 mg by mouth 2 (two) times daily.     No facility-administered medications prior to visit.      Allergies:   Penicillins; Other; and Neosporin [neomycin-bacitracin zn-polymyx]   Social History   Socioeconomic History  . Marital status: Married    Spouse name: Not on file  . Number of children: Not on file  . Years of education: Not on file  . Highest education level: Not on file  Occupational History  . Not on file  Social Needs  . Financial resource strain: Not on file  . Food insecurity:    Worry: Not on file    Inability: Not on file  . Transportation needs:    Medical: Not on file    Non-medical: Not on file  Tobacco Use  . Smoking status: Never Smoker  . Smokeless tobacco: Never Used  Substance and Sexual Activity  . Alcohol use: No  . Drug use: No  . Sexual activity: Not on file  Lifestyle  . Physical activity:    Days per week: Not on file    Minutes per session: Not on file  . Stress: Not on file  Relationships  . Social connections:    Talks on phone: Not on file    Gets together: Not on file    Attends religious service: Not on file    Active member of club or organization: Not on file    Attends meetings of clubs or  organizations: Not on file    Relationship status: Not on file  Other Topics Concern  . 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     Family History:  The patient's family history includes Breast cancer in her maternal aunt and sister; Hypertension in her mother.   ROS General: Negative; No fevers, chills, or night sweats;  HEENT: Negative; No changes in vision or hearing, sinus congestion, difficulty swallowing Pulmonary: Negative; No cough, wheezing, shortness of breath, hemoptysis Cardiovascular: Negative; No chest pain, presyncope, syncope, palpitations GI: Negative; No nausea, vomiting, diarrhea, or abdominal pain GU: Negative; No dysuria, hematuria, or difficulty voiding Musculoskeletal: Negative; no myalgias, joint pain, or weakness Hematologic/Oncology: Negative; no easy bruising, bleeding Endocrine: Negative; no heat/cold intolerance; no diabetes Neuro: Negative; no changes in balance, headaches Skin: Negative; No rashes or skin lesions Psychiatric: Negative; No behavioral problems, depression Sleep:See HPI Other comprehensive 14 point system review is negative.   PHYSICAL EXAM:   VS:  BP 120/68   Pulse (!) 56   Ht 5\' 8"  (1.727 m)   Wt 276 lb (125.2 kg)   BMI 41.97 kg/m     Repeat blood pressure by me was 122/70  Wt Readings from Last 3 Encounters:  01/19/18 276 lb (125.2 kg)  07/18/17 282 lb 4 oz (128 kg)  02/22/17 275 lb 6.4 oz (124.9 kg)    General: Alert, oriented, no distress.  Skin: normal turgor, no rashes, warm and dry HEENT: Normocephalic, atraumatic. Pupils equal round and reactive to light; sclera anicteric; extraocular muscles intact;  Nose without nasal septal hypertrophy Mouth/Parynx benign; Mallinpatti scale 3 Neck: No JVD, no carotid bruits; normal carotid upstroke Lungs: clear to ausculatation and percussion; no wheezing  or rales Chest wall: without tenderness to palpitation Heart: PMI not displaced, RRR, s1 s2  normal, 1/6 systolic murmur, no diastolic murmur, no rubs, gallops, thrills, or heaves Abdomen: soft, nontender; no hepatosplenomehaly, BS+; abdominal aorta nontender and not dilated by palpation. Back: no CVA tenderness Pulses 2+ Musculoskeletal: full range of motion, normal strength, no joint deformities Extremities: no clubbing cyanosis or edema, Homan's sign negative  Neurologic: grossly nonfocal; Cranial nerves grossly wnl Psychologic: Normal mood and affect   Studies/Labs Reviewed:   EKG:  EKG is ordered today.  ECG (independently read by me): Sinus bradycardia at 56 bpm.  First-degree AV block with a PR interval of 210 ms.  Q waves inferiorly with preserved R waves  July 2018 ECG (independently read by me): Sinus bradycardia 53 bpm.  PR interval 192 ms.  QTc interval 457 ms.  Nonspecific T changes in lead 3.  April 2018 ECG (independently read by me): Sinus bradycardia 56 bpm.  Left axis deviation.  T-wave inversion in lead 3.  QS in V2.  QTc interval 420 ms.  PR interval 196 ms.  Recent Labs: BMP Latest Ref Rng & Units 02/22/2017 09/24/2016 05/07/2016  Glucose 65 - 99 mg/dL 81 188(H) 130(H)  BUN 8 - 27 mg/dL 19 14 18   Creatinine 0.57 - 1.00 mg/dL 0.80 0.74 0.72  BUN/Creat Ratio 12 - 28 24 - -  Sodium 134 - 144 mmol/L 140 134(L) 136  Potassium 3.5 - 5.2 mmol/L 4.6 4.1 4.6  Chloride 96 - 106 mmol/L 101 102 102  CO2 20 - 29 mmol/L 23 24 26   Calcium 8.7 - 10.3 mg/dL 10.8(H) 10.3 10.7(H)     No flowsheet data found.  CBC Latest Ref Rng & Units 02/22/2017 01/05/2016 02/07/2008  WBC 3.4 - 10.8 x10E3/uL 10.3 9.3 8.5  Hemoglobin 11.1 - 15.9 g/dL 13.2 13.7 13.1  Hematocrit 34.0 - 46.6 % 37.7 40.8 39.6  Platelets 150 - 379 x10E3/uL 252 277 219   Lab Results  Component Value Date   MCV 85 02/22/2017   MCV 89.7 01/05/2016   MCV 89.8 02/07/2008   No results found for: TSH No results found for: HGBA1C   BNP No results found for: BNP  ProBNP No results found for:  PROBNP   Lipid Panel  No results found for: CHOL, TRIG, HDL, CHOLHDL, VLDL, LDLCALC, LDLDIRECT   RADIOLOGY: No results found.   Additional studies/ records that were reviewed today include:  I had previously  read the office notes of Drs. Caryl Bauer admission, and reviewed her diagnostic polysomnogram as well as CPAP titration trial.  I obtained a download of her CPAP unit in the office today for compliance and efficacy assessment.  In the office today I obtained a new download from July 1 through 10/25/2016.   ASSESSMENT:    No diagnosis found.   PLAN:  Dominique Bauer is a 72 year old female who has a history of hypertension, and previous persistent atrial fibrillation which proved refractory to additional antiarrhythmics and prior cardioversions, but ultimately she was successfully cardioverted following a tikosyn load.  She was  found to have severe obstructive sleep apnea with an overall AHI of 31.2 per hour and very severe sleep apnea during REM sleep with an AHI of 58.3 per hour.  She had marked oxygen desaturation to a nadir of 69% on her diagnostic study.  When I saw her for her initial sleep evaluation she was meeting compliance but was sleeping an adequate duration at only 4 hours and 30  minutes.  I had a lengthy discussion with her regarding the effects of obstructive sleep apnea on cardiovascular health and particularly with reference to its risk for atrial fibrillation and increased incidence of recurrence of atrial fibrillation if untreated.  We discussed optimal sleep duration.  On her most recent download, she is 100% compliant and is now averaging 7 hours and 28 minutes of CPAP use per night.  Her AHI is excellent at 0.5 and her 95th percentile pressure is 11.6.  Her blood pressure today is well controlled.  She is tolerating her new mask and does not have significant leak.  When I last saw her, she was  bradycardic in the low 50s and I reduced her beta-blocker therapy.  She is  maintaining sinus rhythm and ventricular rate today is 56.  She continues to be on benazepril 40 mg and is now taking metoprolol 25 mg in the morning and 12.5 mg in the evening.  She continues to be on Tikosyn 375 mcg twice a day.  She is not having any wheezing exacerbation and continues to be on Brio Ellipta.  She is diabetic on glimepiride.  She is morbidly obese with a BMI of almost 42.  We discussed the importance of weight loss and exercise.  From a sleep perspective, I will see her in 1 year for reevaluation.   Medication Adjustments/Labs and Tests Ordered: Current medicines are reviewed at length with the patient today.  Concerns regarding medicines are outlined above.  Medication changes, Labs and Tests ordered today are listed in the Patient Instructions below.  Patient Instructions  Follow-Up: At [facility], you and your health needs are our priority.  As part of our continuing mission to provide you with exceptional heart care, we have created designated Provider Care Teams.  These Care Teams include your primary Cardiologist (physician) and Advanced Practice Providers (APPs -  Physician Assistants and Nurse Practitioners) who all work together to provide you with the care you need, when you need it. . 1 year with Dr. Claiborne Billings (sleep clinic)      Signed, Dominique Majestic, MD  01/19/2018 7:35 PM    [place] [address] [place], [place], [place]  [address] Phone: [phone]

## 2018-01-21 ENCOUNTER — Encounter: Payer: Self-pay | Admitting: Cardiovascular Disease

## 2018-01-25 DIAGNOSIS — M9903 Segmental and somatic dysfunction of lumbar region: Secondary | ICD-10-CM | POA: Diagnosis not present

## 2018-01-25 DIAGNOSIS — M5137 Other intervertebral disc degeneration, lumbosacral region: Secondary | ICD-10-CM | POA: Diagnosis not present

## 2018-01-25 DIAGNOSIS — M9904 Segmental and somatic dysfunction of sacral region: Secondary | ICD-10-CM | POA: Diagnosis not present

## 2018-01-25 DIAGNOSIS — M9902 Segmental and somatic dysfunction of thoracic region: Secondary | ICD-10-CM | POA: Diagnosis not present

## 2018-01-30 ENCOUNTER — Other Ambulatory Visit: Payer: Self-pay | Admitting: Cardiovascular Disease

## 2018-01-30 NOTE — Telephone Encounter (Signed)
Rx(s) sent to pharmacy electronically.  

## 2018-02-08 DIAGNOSIS — M9904 Segmental and somatic dysfunction of sacral region: Secondary | ICD-10-CM | POA: Diagnosis not present

## 2018-02-08 DIAGNOSIS — M9902 Segmental and somatic dysfunction of thoracic region: Secondary | ICD-10-CM | POA: Diagnosis not present

## 2018-02-08 DIAGNOSIS — M9903 Segmental and somatic dysfunction of lumbar region: Secondary | ICD-10-CM | POA: Diagnosis not present

## 2018-02-08 DIAGNOSIS — M5137 Other intervertebral disc degeneration, lumbosacral region: Secondary | ICD-10-CM | POA: Diagnosis not present

## 2018-02-09 NOTE — Progress Notes (Signed)
Cardiology Office Note:    Date:  02/15/2018   ID:  Dominique Bauer, DOB [birth year], MRN [id number]  PCP:  [name], MD  Cardiologist:  Dr. Jenkins Rouge   Electrophysiologist:  n/a  Referring MD: [name], MD   No chief complaint on file.   History of Present Illness:    72 y.o. with PAF. History of HTN and OSA. Failed DCC alone and on flecainide 01/07/16. Subsequently begun on Tikosyn in hospital with successful [facility] 04/29/16.  Seen by Dr [name] 02/22/17 and beta blocker stopped due to relative bradycardia Last TTE done April 2019 No valve disease normal EF mild to moderate LAE   CHADS2-VASc=3 (female, 72 yo, HTN).     Uses CPA for OSA.  Januvia d/c last year due to frequent infections on it  Still with some fatigue and exertional dyspnea   Prior CV studies that were reviewed today include:    ETT 12/04/15 Blood pressure demonstrated a normal response to exercise. There was no ST segment deviation noted during stress. Negative, adequate stress test.  TTE done 07/18/17 EF 60-65% no valve disease normal PA estimate grade 2 diastolic dysfunction  Past Medical History:  Diagnosis Date  . Body mass index 40.0-44.9, adult ([place])   . H/O measles   . H/O mumps   . H/O: whooping cough   . History of chicken pox   . Hypertension   . Persistent atrial fibrillation   . Sciatica   . Sleep apnea    not using aything at night right now/LH    Past Surgical History:  Procedure Laterality Date  . ABDOMINAL HYSTERECTOMY    . BREAST BIOPSY Right 02/01/2012  . BREAST EXCISIONAL BIOPSY Bilateral    benign  . CARDIOVERSION N/A 11/17/2015   Procedure: CARDIOVERSION;  Surgeon: [name], MD;  Location: [facility] ENDOSCOPY;  Service: Cardiovascular;  Laterality: N/A;  . CARDIOVERSION N/A 01/07/2016   Procedure: CARDIOVERSION;  Surgeon: [name], MD;  Location: [place];  Service: Cardiovascular;  Laterality: N/A;  . CARDIOVERSION N/A 04/29/2016   Procedure:  CARDIOVERSION;  Surgeon: Sanda Klein, MD;  Location: [facility] ENDOSCOPY;  Service: Cardiovascular;  Laterality: N/A;  . DG  BONE DENSITY ([place] HX)    . INNER EAR SURGERY    . NASAL SEPTUM SURGERY      Current Medications: Current Meds  Medication Sig  . acetaminophen (TYLENOL) 650 MG CR tablet Take 650 mg by mouth every 8 (eight) hours as needed for pain (arthritis).  Marland Kitchen apixaban (ELIQUIS) 5 MG TABS tablet Take 5 mg by mouth 2 (two) times daily.  . benazepril (LOTENSIN) 40 MG tablet Take 40 mg by mouth daily.  Marland Kitchen BREO ELLIPTA 200-25 MCG/INH AEPB Inhale 1 puff into the lungs every morning. At the same time each day  . calcium carbonate (CALCIUM 600) 1500 (600 Ca) MG TABS tablet Take 1,500 mg by mouth daily with breakfast.  . calcium carbonate (TUMS - DOSED IN MG ELEMENTAL CALCIUM) 500 MG chewable tablet Chew 1 tablet by mouth daily as needed for indigestion or heartburn.  . dofetilide (TIKOSYN) 125 MCG capsule Take 3 capsules (375 mcg total) by mouth 2 (two) times daily.  . fluticasone (FLONASE) 50 MCG/ACT nasal spray Place 1 spray into both nostrils daily.  Marland Kitchen glimepiride (AMARYL) 2 MG tablet Take 2 mg by mouth as directed. Taking 2 times daily  . L-LYSINE PO TAKE ONE TABLET BY MOUTH DAILY  . magnesium oxide (MAG-OX) 400 MG tablet TAKE  1 TABLET BY MOUTH TWICE A DAY  . metoprolol tartrate (LOPRESSOR) 25 MG tablet Take 25 mg by mouth. 1 tablet in the morning and 1/2 tablet in the evening  . multivitamin-lutein (OCUVITE-LUTEIN) CAPS capsule Take 1 capsule by mouth daily.  . Omega-3 Fatty Acids (FISH OIL) 1000 MG CAPS Take 1,000 mg by mouth 3 (three) times daily.   Marland Kitchen triamcinolone cream (KENALOG) 0.1 % Apply 1 application topically daily as needed. For rash     Allergies:   Penicillins; Other; and Neosporin [neomycin-bacitracin zn-polymyx]   Social History   Socioeconomic History  . Marital status: Married    Spouse name: Not on file  . Number of children: Not on file  . Years of education:  Not on file  . Highest education level: Not on file  Occupational History  . Not on file  Social Needs  . Financial resource strain: Not on file  . Food insecurity:    Worry: Not on file    Inability: Not on file  . Transportation needs:    Medical: Not on file    Non-medical: Not on file  Tobacco Use  . Smoking status: Never Smoker  . Smokeless tobacco: Never Used  Substance and Sexual Activity  . Alcohol use: No  . Drug use: No  . Sexual activity: Not on file  Lifestyle  . Physical activity:    Days per week: Not on file    Minutes per session: Not on file  . Stress: Not on file  Relationships  . Social connections:    Talks on phone: Not on file    Gets together: Not on file    Attends religious service: Not on file    Active member of club or organization: Not on file    Attends meetings of clubs or organizations: Not on file    Relationship status: Not on file  Other Topics Concern  . 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     Family History:  The patient's family history includes Breast cancer in her maternal aunt and sister; Hypertension in her mother.   ROS:   Please see the history of present illness.    Review of Systems  Constitution: Positive for diaphoresis and malaise/fatigue.  Cardiovascular: Positive for dyspnea on exertion and irregular heartbeat.  Respiratory: Positive for snoring.   Skin: Positive for rash.  Musculoskeletal: Positive for back pain, joint swelling and myalgias.  Neurological: Positive for dizziness and headaches.   All other systems reviewed and are negative.   EKGs/Labs/Other Test Reviewed:    EKG:   07/09/16 SR rate 58 QT 438  Recent Labs: 02/22/2017: BUN 19; Creatinine, Ser 0.80; Hemoglobin 13.2; Magnesium 2.2; Platelets 252; Potassium 4.6; Sodium 140   Recent Lipid Panel No results found for: CHOL, TRIG, HDL, CHOLHDL, VLDL, LDLCALC, LDLDIRECT   Physical Exam:    VS:  BP 128/68    Pulse 68   Ht 5\' 8"  (1.727 m)   Wt 276 lb 8 oz (125.4 kg)   SpO2 98%   BMI 42.04 kg/m     Wt Readings from Last 3 Encounters:  02/15/18 276 lb 8 oz (125.4 kg)  01/19/18 276 lb (125.2 kg)  07/18/17 282 lb 4 oz (128 kg)    Affect appropriate Healthy:  appears stated age HEENT: normal Neck supple with no adenopathy JVP normal no bruits no thyromegaly Lungs clear with no wheezing and good diaphragmatic motion Heart:  S1/S2 no murmur, no rub, gallop or click PMI normal Abdomen: benighn, BS positve, no tenderness, no AAA no bruit.  No HSM or HJR Distal pulses intact with no bruits No edema Neuro non-focal Skin warm and dry No muscular weakness    ASSESSMENT:    No diagnosis found. PLAN:    In order of problems listed above:  1. PAF:  Maintaining NSR on Tikosyn labs and QT ok f/u Dr [name]   2. HTN - Well controlled.  Continue current medications and low sodium Dash type diet.     3. OSA - F/U with Dr Claiborne Billings regarding issues with CPAP  4. DM:  januvia d/c by primary Consider Jardiance for cardioprotective effect  5. Dyspnea:  Likely from obesity and chronic bronchitis TTE done 07/20/17 EF 60-65% no valve disease Grade 2 diastolic dysfunction Will try her on low dose lasix M/W/F see if helps dyspnea and LE edema not daily due to Tikosyn use   [employer]

## 2018-02-15 ENCOUNTER — Encounter: Payer: Self-pay | Admitting: Cardiovascular Disease

## 2018-02-15 ENCOUNTER — Ambulatory Visit: Payer: Medicare Other | Admitting: Cardiovascular Disease

## 2018-02-15 VITALS — BP 128/68 | HR 68 | Ht 68.0 in | Wt 276.5 lb

## 2018-02-15 DIAGNOSIS — I480 Paroxysmal atrial fibrillation: Secondary | ICD-10-CM | POA: Diagnosis not present

## 2018-02-15 MED ORDER — HYDROCHLOROTHIAZIDE 12.5 MG PO CAPS: 12.5000 mg | ORAL_CAPSULE | Freq: Every day | ORAL | 3 refills | Status: DC

## 2018-02-15 NOTE — Patient Instructions (Signed)
Medication Instructions:   Your physician has recommended you make the following change in your medication:  1-START hydrochlorothiazide 12.5 mg by mouth Monday, Wednesday, and Friday.  If you need a refill on your cardiac medications before your next appointment, please call your pharmacy.   Lab work:  If you have labs (blood work) drawn today and your tests are completely normal, you will receive your results only by: Marland Kitchen MyChart Message (if you have MyChart) OR . A paper copy in the mail If you have any lab test that is abnormal or we need to change your treatment, we will call you to review the results.  Testing/Procedures: NONE at this time.  Follow-Up: At [facility], you and your health needs are our priority.  As part of our continuing mission to provide you with exceptional heart care, we have created designated Provider Care Teams.  These Care Teams include your primary Cardiologist (physician) and Advanced Practice Providers (APPs -  Physician Assistants and Nurse Practitioners) who all work together to provide you with the care you need, when you need it. You will need a follow up appointment in 6 months.  Please call our office 2 months in advance to schedule this appointment.  You may see Jenkins Rouge, MD or one of the following Advanced Practice Providers on your designated Care Team:   Truitt Merle, NP Cecilie Kicks, NP . Kathyrn Drown, NP

## 2018-02-16 DIAGNOSIS — E7849 Other hyperlipidemia: Secondary | ICD-10-CM | POA: Diagnosis not present

## 2018-02-16 DIAGNOSIS — Z0000 Encounter for general adult medical examination without abnormal findings: Secondary | ICD-10-CM | POA: Diagnosis not present

## 2018-02-16 DIAGNOSIS — E119 Type 2 diabetes mellitus without complications: Secondary | ICD-10-CM | POA: Diagnosis not present

## 2018-02-16 DIAGNOSIS — I10 Essential (primary) hypertension: Secondary | ICD-10-CM | POA: Diagnosis not present

## 2018-02-20 DIAGNOSIS — E7849 Other hyperlipidemia: Secondary | ICD-10-CM | POA: Diagnosis not present

## 2018-02-20 DIAGNOSIS — E119 Type 2 diabetes mellitus without complications: Secondary | ICD-10-CM | POA: Diagnosis not present

## 2018-02-20 DIAGNOSIS — I10 Essential (primary) hypertension: Secondary | ICD-10-CM | POA: Diagnosis not present

## 2018-02-22 DIAGNOSIS — M5137 Other intervertebral disc degeneration, lumbosacral region: Secondary | ICD-10-CM | POA: Diagnosis not present

## 2018-02-22 DIAGNOSIS — M9903 Segmental and somatic dysfunction of lumbar region: Secondary | ICD-10-CM | POA: Diagnosis not present

## 2018-02-22 DIAGNOSIS — M9902 Segmental and somatic dysfunction of thoracic region: Secondary | ICD-10-CM | POA: Diagnosis not present

## 2018-02-22 DIAGNOSIS — M9904 Segmental and somatic dysfunction of sacral region: Secondary | ICD-10-CM | POA: Diagnosis not present

## 2018-02-28 ENCOUNTER — Encounter: Payer: Self-pay | Admitting: Internal Medicine

## 2018-02-28 ENCOUNTER — Ambulatory Visit: Payer: Medicare Other | Admitting: Internal Medicine

## 2018-02-28 VITALS — BP 128/72 | HR 58 | Ht 68.0 in | Wt 274.6 lb

## 2018-02-28 DIAGNOSIS — I10 Essential (primary) hypertension: Secondary | ICD-10-CM

## 2018-02-28 DIAGNOSIS — I480 Paroxysmal atrial fibrillation: Secondary | ICD-10-CM

## 2018-02-28 MED ORDER — SPIRONOLACTONE 25 MG PO TABS: 12.5000 mg | ORAL_TABLET | ORAL | 3 refills | Status: DC

## 2018-02-28 NOTE — Patient Instructions (Signed)
Medication Instructions:  Your physician has recommended you make the following change in your medication:   1. Begin Spironolactone, 12.5mg  half tablet, every other day  Labwork: You will have labs drawn today: CBC, BMP, Mg  Testing/Procedures: None ordered.  Follow-Up: Your physician recommends that you schedule a follow-up appointment in:   One Year with Dominique Bjork, PA   Any Other Special Instructions Will Be Listed Below (If Applicable).     If you need a refill on your cardiac medications before your next appointment, please call your pharmacy.

## 2018-02-28 NOTE — Progress Notes (Signed)
Patient Care Team: [name], MD as PCP - General (Internal Medicine) [name], MD as PCP - Cardiology (Cardiology)   HPI  Dominique Bauer is a 72 y.o. female Seen in follow-up for atrial fibrillation for which she was admitted for dofetilide initiation 1/18. She was seen in the A. fib clinic 2/18 feeling much better in sinus rhythm.  She has a history of persistent long-standing persistent atrial fibrillation for which cardioversion with dofetilide surprisingly effective.    No interval atrial fibrillation of which she is aware except around the time of her sisters expected death.  No chest pain; some intermittent problematic edema.  Dyspnea on exertion is chronic and stable.  Sleep with mask.  DATE TEST EF   11/17 Echo   65 %   4/19 Echo   65 % LAE mild mod          Date Cr K Mg Hgb  2/18  0.72 4.6 1.9   6/18 0.74 4.1 1.6   11/18 0.8 4.6 2.2 13.2           Past Medical History:  Diagnosis Date  . Body mass index 40.0-44.9, adult ([place])   . H/O measles   . H/O mumps   . H/O: whooping cough   . History of chicken pox   . Hypertension   . Persistent atrial fibrillation   . Sciatica   . Sleep apnea    not using aything at night right now/LH    Past Surgical History:  Procedure Laterality Date  . ABDOMINAL HYSTERECTOMY    . BREAST BIOPSY Right 02/01/2012  . BREAST EXCISIONAL BIOPSY Bilateral    benign  . CARDIOVERSION N/A 11/17/2015   Procedure: CARDIOVERSION;  Surgeon: [name], MD;  Location: [facility] ENDOSCOPY;  Service: Cardiovascular;  Laterality: N/A;  . CARDIOVERSION N/A 01/07/2016   Procedure: CARDIOVERSION;  Surgeon: [name], MD;  Location: [place];  Service: Cardiovascular;  Laterality: N/A;  . CARDIOVERSION N/A 04/29/2016   Procedure: CARDIOVERSION;  Surgeon: Sanda Klein, MD;  Location: [facility] ENDOSCOPY;  Service: Cardiovascular;  Laterality: N/A;  . DG  BONE DENSITY ([place] HX)    . INNER EAR SURGERY    . NASAL SEPTUM  SURGERY      Current Outpatient Medications  Medication Sig Dispense Refill  . acetaminophen (TYLENOL) 650 MG CR tablet Take 650 mg by mouth every 8 (eight) hours as needed for pain (arthritis).    Marland Kitchen apixaban (ELIQUIS) 5 MG TABS tablet Take 5 mg by mouth 2 (two) times daily.    Marland Kitchen atorvastatin (LIPITOR) 20 MG tablet Take 20 mg by mouth daily.  0  . benazepril (LOTENSIN) 40 MG tablet Take 40 mg by mouth daily.    Marland Kitchen BREO ELLIPTA 200-25 MCG/INH AEPB Inhale 1 puff into the lungs every morning. At the same time each day  0  . calcium carbonate (CALCIUM 600) 1500 (600 Ca) MG TABS tablet Take 1,500 mg by mouth daily with breakfast.    . calcium carbonate (TUMS - DOSED IN MG ELEMENTAL CALCIUM) 500 MG chewable tablet Chew 1 tablet by mouth daily as needed for indigestion or heartburn.    . dofetilide (TIKOSYN) 125 MCG capsule Take 3 capsules (375 mcg total) by mouth 2 (two) times daily. 540 capsule 3  . fluticasone (FLONASE) 50 MCG/ACT nasal spray Place 1 spray into both nostrils daily.    Marland Kitchen glimepiride (AMARYL) 2 MG tablet Take 2 mg by mouth as  directed. Taking 2 times daily    . hydrochlorothiazide (MICROZIDE) 12.5 MG capsule Take 1 capsule (12.5 mg total) by mouth daily. 90 capsule 3  . L-LYSINE PO TAKE ONE TABLET BY MOUTH DAILY    . magnesium oxide (MAG-OX) 400 MG tablet TAKE 1 TABLET BY MOUTH TWICE A DAY 180 tablet 3  . metoprolol tartrate (LOPRESSOR) 25 MG tablet Take 25 mg by mouth. 1 tablet in the morning and 1/2 tablet in the evening    . multivitamin-lutein (OCUVITE-LUTEIN) CAPS capsule Take 1 capsule by mouth daily.    . Omega-3 Fatty Acids (FISH OIL) 1000 MG CAPS Take 1,000 mg by mouth 3 (three) times daily.     Marland Kitchen triamcinolone cream (KENALOG) 0.1 % Apply 1 application topically daily as needed. For rash     No current facility-administered medications for this visit.     Allergies  Allergen Reactions  . Penicillins Hives    severe  . Other     Cigarette smoke and certain perfumes -  chest tightening and runny nose  . Neosporin [Neomycin-Bacitracin Zn-Polymyx] Rash      Review of Systems negative except from HPI and PMH  Physical Exam BP 128/72   Pulse (!) 58   Ht 5\' 8"  (1.727 m)   Wt 274 lb 9.6 oz (124.6 kg)   SpO2 95%   BMI 41.75 kg/m  Well developed and nourished in no acute distress HENT normal Neck supple with JVP-flat Clear Regular rate and rhythm, no murmurs or gallops Abd-soft with active BS No Clubbing cyanosis edema Skin-warm and dry A & Oriented  Grossly normal sensory and motor function    ECG sinus @ 58 19/09/44 good thing  Assessment and  Plan Afib persistent  OSA  Hypertension  Sinus Bradycardia  DOE  High Risk Medication Surveillance    She was started on hydrochlorothiazide by another physician.  She understood she should not take it.  We will use spironolactone for her intermittent edema  We will check surveillance laboratories on dofetilide and bleeding labs on her apixaban  Blood pressure well controlled  Encouraged weight loss and exercise  10-year expected cardiac event rate is over 20%.  Concur with PCPs recommendation to initiate statin therapy  We spent more than 50% of our >25 min visit in face to face counseling regarding the above     Current medicines are reviewed at length with the patient today .  The patient does not  have concerns regarding medicines.

## 2018-03-01 LAB — MAGNESIUM: Magnesium: 2.1 mg/dL (ref 1.6–2.3)

## 2018-03-01 LAB — BASIC METABOLIC PANEL
BUN/Creatinine Ratio: 23 (ref 12–28)
BUN: 17 mg/dL (ref 8–27)
CALCIUM: 10.7 mg/dL — AB (ref 8.7–10.3)
CO2: 23 mmol/L (ref 20–29)
CREATININE: 0.73 mg/dL (ref 0.57–1.00)
Chloride: 100 mmol/L (ref 96–106)
GFR, EST AFRICAN AMERICAN: 95 mL/min/{1.73_m2} (ref >59)
GFR, EST NON AFRICAN AMERICAN: 83 mL/min/{1.73_m2} (ref >59)
Glucose: 129 mg/dL — ABNORMAL HIGH (ref 65–99)
Potassium: 4.7 mmol/L (ref 3.5–5.2)
SODIUM: 141 mmol/L (ref 134–144)

## 2018-03-01 LAB — CBC
Hematocrit: 39.6 % (ref 34.0–46.6)
Hemoglobin: 13.5 g/dL (ref 11.1–15.9)
MCH: 28.8 pg (ref 26.6–33.0)
MCHC: 34.1 g/dL (ref 31.5–35.7)
MCV: 85 fL (ref 79–97)
Platelets: 275 10*3/uL (ref 150–450)
RBC: 4.68 x10E6/uL (ref 3.77–5.28)
RDW: 12.7 % (ref 12.3–15.4)
WBC: 10.3 10*3/uL (ref 3.4–10.8)

## 2018-03-08 DIAGNOSIS — M9902 Segmental and somatic dysfunction of thoracic region: Secondary | ICD-10-CM | POA: Diagnosis not present

## 2018-03-08 DIAGNOSIS — M9904 Segmental and somatic dysfunction of sacral region: Secondary | ICD-10-CM | POA: Diagnosis not present

## 2018-03-08 DIAGNOSIS — M5137 Other intervertebral disc degeneration, lumbosacral region: Secondary | ICD-10-CM | POA: Diagnosis not present

## 2018-03-08 DIAGNOSIS — M9903 Segmental and somatic dysfunction of lumbar region: Secondary | ICD-10-CM | POA: Diagnosis not present

## 2018-03-21 DIAGNOSIS — E7849 Other hyperlipidemia: Secondary | ICD-10-CM | POA: Diagnosis not present

## 2018-03-21 DIAGNOSIS — I10 Essential (primary) hypertension: Secondary | ICD-10-CM | POA: Diagnosis not present

## 2018-03-21 DIAGNOSIS — E119 Type 2 diabetes mellitus without complications: Secondary | ICD-10-CM | POA: Diagnosis not present

## 2018-03-23 DIAGNOSIS — M9904 Segmental and somatic dysfunction of sacral region: Secondary | ICD-10-CM | POA: Diagnosis not present

## 2018-03-23 DIAGNOSIS — M5137 Other intervertebral disc degeneration, lumbosacral region: Secondary | ICD-10-CM | POA: Diagnosis not present

## 2018-03-23 DIAGNOSIS — M9902 Segmental and somatic dysfunction of thoracic region: Secondary | ICD-10-CM | POA: Diagnosis not present

## 2018-03-23 DIAGNOSIS — M9903 Segmental and somatic dysfunction of lumbar region: Secondary | ICD-10-CM | POA: Diagnosis not present

## 2018-04-04 DIAGNOSIS — M85852 Other specified disorders of bone density and structure, left thigh: Secondary | ICD-10-CM | POA: Diagnosis not present

## 2018-04-04 DIAGNOSIS — M810 Age-related osteoporosis without current pathological fracture: Secondary | ICD-10-CM | POA: Diagnosis not present

## 2018-04-05 DIAGNOSIS — M9903 Segmental and somatic dysfunction of lumbar region: Secondary | ICD-10-CM | POA: Diagnosis not present

## 2018-04-05 DIAGNOSIS — M9904 Segmental and somatic dysfunction of sacral region: Secondary | ICD-10-CM | POA: Diagnosis not present

## 2018-04-05 DIAGNOSIS — M9902 Segmental and somatic dysfunction of thoracic region: Secondary | ICD-10-CM | POA: Diagnosis not present

## 2018-04-05 DIAGNOSIS — M5137 Other intervertebral disc degeneration, lumbosacral region: Secondary | ICD-10-CM | POA: Diagnosis not present

## 2018-04-10 DIAGNOSIS — J40 Bronchitis, not specified as acute or chronic: Secondary | ICD-10-CM | POA: Diagnosis not present

## 2018-04-27 DIAGNOSIS — E119 Type 2 diabetes mellitus without complications: Secondary | ICD-10-CM | POA: Diagnosis not present

## 2018-04-27 DIAGNOSIS — E7849 Other hyperlipidemia: Secondary | ICD-10-CM | POA: Diagnosis not present

## 2018-04-27 DIAGNOSIS — I10 Essential (primary) hypertension: Secondary | ICD-10-CM | POA: Diagnosis not present

## 2018-05-03 DIAGNOSIS — L0291 Cutaneous abscess, unspecified: Secondary | ICD-10-CM | POA: Diagnosis not present

## 2018-05-08 ENCOUNTER — Other Ambulatory Visit: Payer: Self-pay | Admitting: Internal Medicine

## 2018-05-08 MED ORDER — DOFETILIDE 125 MCG PO CAPS: 375.0000 ug | ORAL_CAPSULE | Freq: Two times a day (BID) | ORAL | 3 refills | Status: DC

## 2018-05-08 NOTE — Telephone Encounter (Signed)
Spoke with patient since initial message mentioned additional questions related to Tikosyn. She states she wanted to make sure the refill went to [facility] instead of [facility] since they have trouble ordering refills. Advised pt that refill was sent to preferred pharmacy, no further questions.

## 2018-05-08 NOTE — Telephone Encounter (Signed)
° °  Patient has questions about getting Tikosyn, states there is always an "issue"    1. Which medications need to be refilled? (please list name of each medication and dose if known) dofetilide (TIKOSYN) 125 MCG capsule  2. Which pharmacy/location (including street and city if local pharmacy) is medication to be sent to? [facility]  3. Do they need a 30 day or 90 day supply?

## 2018-05-08 NOTE — Telephone Encounter (Signed)
Pt's medication was sent to pt's pharmacy as requested. Confirmation received.  °

## 2018-05-22 DIAGNOSIS — J45909 Unspecified asthma, uncomplicated: Secondary | ICD-10-CM | POA: Diagnosis not present

## 2018-05-22 DIAGNOSIS — Z0000 Encounter for general adult medical examination without abnormal findings: Secondary | ICD-10-CM | POA: Diagnosis not present

## 2018-05-22 DIAGNOSIS — Z1389 Encounter for screening for other disorder: Secondary | ICD-10-CM | POA: Diagnosis not present

## 2018-05-22 DIAGNOSIS — E1165 Type 2 diabetes mellitus with hyperglycemia: Secondary | ICD-10-CM | POA: Diagnosis not present

## 2018-05-22 DIAGNOSIS — I10 Essential (primary) hypertension: Secondary | ICD-10-CM | POA: Diagnosis not present

## 2018-05-24 DIAGNOSIS — E1165 Type 2 diabetes mellitus with hyperglycemia: Secondary | ICD-10-CM | POA: Diagnosis not present

## 2018-05-24 DIAGNOSIS — I10 Essential (primary) hypertension: Secondary | ICD-10-CM | POA: Diagnosis not present

## 2018-06-21 DIAGNOSIS — I10 Essential (primary) hypertension: Secondary | ICD-10-CM | POA: Diagnosis not present

## 2018-06-21 DIAGNOSIS — E1165 Type 2 diabetes mellitus with hyperglycemia: Secondary | ICD-10-CM | POA: Diagnosis not present

## 2018-07-06 DIAGNOSIS — E1165 Type 2 diabetes mellitus with hyperglycemia: Secondary | ICD-10-CM | POA: Diagnosis not present

## 2018-07-06 DIAGNOSIS — I10 Essential (primary) hypertension: Secondary | ICD-10-CM | POA: Diagnosis not present

## 2018-08-01 ENCOUNTER — Other Ambulatory Visit: Payer: Self-pay | Admitting: Internal Medicine

## 2018-08-01 DIAGNOSIS — Z1231 Encounter for screening mammogram for malignant neoplasm of breast: Secondary | ICD-10-CM

## 2018-08-08 ENCOUNTER — Telehealth: Payer: Self-pay

## 2018-08-08 NOTE — Telephone Encounter (Signed)
Called patient back. Patient stated she is doing fine at this time, and would like to reschedule at a later date. Made patient an appointment in September. Encouraged patient to call the office if she has any concerns that come up.

## 2018-08-08 NOTE — Telephone Encounter (Signed)
Follow up  ° ° °Pt is returning call  ° ° °Please call back  °

## 2018-08-08 NOTE — Telephone Encounter (Signed)
Tried to call patient about her appt next week. Left message for patient to call back. Need to discuss virtual visit. Will send message through [place].

## 2018-08-10 DIAGNOSIS — H6122 Impacted cerumen, left ear: Secondary | ICD-10-CM | POA: Diagnosis not present

## 2018-08-10 DIAGNOSIS — H7292 Unspecified perforation of tympanic membrane, left ear: Secondary | ICD-10-CM | POA: Diagnosis not present

## 2018-08-16 ENCOUNTER — Ambulatory Visit: Payer: Medicare Other | Admitting: Cardiovascular Disease

## 2018-08-16 DIAGNOSIS — I10 Essential (primary) hypertension: Secondary | ICD-10-CM | POA: Diagnosis not present

## 2018-08-16 DIAGNOSIS — E1165 Type 2 diabetes mellitus with hyperglycemia: Secondary | ICD-10-CM | POA: Diagnosis not present

## 2018-08-22 DIAGNOSIS — I10 Essential (primary) hypertension: Secondary | ICD-10-CM | POA: Diagnosis not present

## 2018-08-22 DIAGNOSIS — E1165 Type 2 diabetes mellitus with hyperglycemia: Secondary | ICD-10-CM | POA: Diagnosis not present

## 2018-08-22 DIAGNOSIS — Z0000 Encounter for general adult medical examination without abnormal findings: Secondary | ICD-10-CM | POA: Diagnosis not present

## 2018-08-22 DIAGNOSIS — J45909 Unspecified asthma, uncomplicated: Secondary | ICD-10-CM | POA: Diagnosis not present

## 2018-08-26 DIAGNOSIS — H35363 Drusen (degenerative) of macula, bilateral: Secondary | ICD-10-CM | POA: Diagnosis not present

## 2018-09-01 ENCOUNTER — Telehealth: Payer: Self-pay

## 2018-09-01 NOTE — Telephone Encounter (Signed)
I tried to call patient about upcoming office visit and switching it to a telehealth visit. There was no answer on the preferred number and no way to leave a message. I tried the second number and no voicemail has been set up yet.

## 2018-09-05 NOTE — Telephone Encounter (Signed)
Patient called back and cancelled appointment on 09/15/18 and rescheduled for 11/21/18.

## 2018-09-05 NOTE — Telephone Encounter (Signed)
I called and left patient a message about changing office visit to telehealth visit on 09/15/18.

## 2018-09-07 DIAGNOSIS — I10 Essential (primary) hypertension: Secondary | ICD-10-CM | POA: Diagnosis not present

## 2018-09-07 DIAGNOSIS — E1165 Type 2 diabetes mellitus with hyperglycemia: Secondary | ICD-10-CM | POA: Diagnosis not present

## 2018-09-15 ENCOUNTER — Telehealth: Payer: Medicare Other | Admitting: Internal Medicine

## 2018-09-25 ENCOUNTER — Other Ambulatory Visit: Payer: Self-pay

## 2018-09-25 ENCOUNTER — Ambulatory Visit
Admission: RE | Admit: 2018-09-25 | Discharge: 2018-09-25 | Disposition: A | Discharge status: Home / Self Care | Payer: Medicare Other | Source: Ambulatory Visit | Attending: Internal Medicine | Admitting: Internal Medicine

## 2018-09-25 DIAGNOSIS — Z1231 Encounter for screening mammogram for malignant neoplasm of breast: Secondary | ICD-10-CM

## 2018-10-04 DIAGNOSIS — I10 Essential (primary) hypertension: Secondary | ICD-10-CM | POA: Diagnosis not present

## 2018-10-04 DIAGNOSIS — E1165 Type 2 diabetes mellitus with hyperglycemia: Secondary | ICD-10-CM | POA: Diagnosis not present

## 2018-11-06 DIAGNOSIS — E1165 Type 2 diabetes mellitus with hyperglycemia: Secondary | ICD-10-CM | POA: Diagnosis not present

## 2018-11-06 DIAGNOSIS — I10 Essential (primary) hypertension: Secondary | ICD-10-CM | POA: Diagnosis not present

## 2018-11-10 DIAGNOSIS — G4733 Obstructive sleep apnea (adult) (pediatric): Secondary | ICD-10-CM | POA: Diagnosis not present

## 2018-11-15 NOTE — Progress Notes (Signed)
Cardiology Office Note:    Date:  11/28/2018   ID:  Dominique Bauer, DOB [birth year], MRN [id number]  PCP:  [name], MD  Cardiologist:  Dr. Jenkins Rouge   Electrophysiologist:  n/a  Referring MD: [name], MD   No chief complaint on file.   History of Present Illness:    73 y.o. with PAF. History of HTN and OSA. Failed DCC alone and on flecainide 01/07/16. Subsequently begun on Tikosyn in hospital with successful [facility] 04/29/16.  Seen by Dr [name] 02/22/17 and beta blocker stopped due to relative bradycardia Last TTE done April 2019 No valve disease normal EF mild to moderate LAE   CHADS2-VASc=3 (female, 73 yo, HTN).     Uses CPA for OSA.  Januvia d/c last year due to frequent infections on it  Some mild LE edema Primary placed her on HCTZ stopped by SK and wanted her to use Aldactone to prevent low K with Tikosyn Also started on statin K 4.7 on labs 02/28/18  No bleeding issues on Eliquis   Started on Jardiance by primary   Prior CV studies that were reviewed today include:    ETT 12/04/15 Blood pressure demonstrated a normal response to exercise. There was no ST segment deviation noted during stress. Negative, adequate stress test.  TTE done 07/18/17 EF 60-65% no valve disease normal PA estimate grade 2 diastolic dysfunction  Past Medical History:  Diagnosis Date  . Body mass index 40.0-44.9, adult ([place])   . H/O measles   . H/O mumps   . H/O: whooping cough   . History of chicken pox   . Hypertension   . Persistent atrial fibrillation   . Sciatica   . Sleep apnea    not using aything at night right now/LH    Past Surgical History:  Procedure Laterality Date  . ABDOMINAL HYSTERECTOMY    . BREAST BIOPSY Right 02/01/2012  . BREAST EXCISIONAL BIOPSY Bilateral    benign  . CARDIOVERSION N/A 11/17/2015   Procedure: CARDIOVERSION;  Surgeon: [name], MD;  Location: [facility] ENDOSCOPY;  Service: Cardiovascular;  Laterality: N/A;  . CARDIOVERSION N/A  01/07/2016   Procedure: CARDIOVERSION;  Surgeon: [name], MD;  Location: [place];  Service: Cardiovascular;  Laterality: N/A;  . CARDIOVERSION N/A 04/29/2016   Procedure: CARDIOVERSION;  Surgeon: Sanda Klein, MD;  Location: [facility] ENDOSCOPY;  Service: Cardiovascular;  Laterality: N/A;  . DG  BONE DENSITY ([place] HX)    . INNER EAR SURGERY    . NASAL SEPTUM SURGERY      Current Medications: Current Meds  Medication Sig  . acetaminophen (TYLENOL) 650 MG CR tablet Take 650 mg by mouth every 8 (eight) hours as needed for pain (arthritis).  Marland Kitchen apixaban (ELIQUIS) 5 MG TABS tablet Take 5 mg by mouth 2 (two) times daily.  . benazepril (LOTENSIN) 40 MG tablet Take 40 mg by mouth daily.  Marland Kitchen BREO ELLIPTA 200-25 MCG/INH AEPB Inhale 1 puff into the lungs every morning. At the same time each day  . calcium carbonate (CALCIUM 600) 1500 (600 Ca) MG TABS tablet Take 1,500 mg by mouth daily with breakfast.  . calcium carbonate (TUMS - DOSED IN MG ELEMENTAL CALCIUM) 500 MG chewable tablet Chew 1 tablet by mouth daily as needed for indigestion or heartburn.  . dofetilide (TIKOSYN) 125 MCG capsule Take 3 capsules (375 mcg total) by mouth 2 (two) times daily.  . empagliflozin (JARDIANCE) 25 MG TABS tablet Take 25 mg by mouth  daily.  . ezetimibe (ZETIA) 10 MG tablet Take 10 mg by mouth daily.  Marland Kitchen glimepiride (AMARYL) 2 MG tablet Take 2-4 mg by mouth as directed. Patient takes 2 tablets in the morning and 1 tablet at night  . L-LYSINE PO TAKE ONE TABLET BY MOUTH DAILY  . magnesium oxide (MAG-OX) 400 MG tablet TAKE 1 TABLET BY MOUTH TWICE A DAY  . metoprolol tartrate (LOPRESSOR) 25 MG tablet Take 25 mg by mouth. 1 tablet in the morning and 1/2 tablet in the evening  . multivitamin-lutein (OCUVITE-LUTEIN) CAPS capsule Take 1 capsule by mouth daily.  . Omega-3 Fatty Acids (FISH OIL) 1000 MG CAPS Take 1,000 mg by mouth 3 (three) times daily.   Marland Kitchen triamcinolone cream (KENALOG) 0.1 % Apply 1 application topically  daily as needed. For rash     Allergies:   Penicillins, Other, and Neosporin [neomycin-bacitracin zn-polymyx]   Social History   Socioeconomic History  . Marital status: Married    Spouse name: Not on file  . Number of children: Not on file  . Years of education: Not on file  . Highest education level: Not on file  Occupational History  . Not on file  Social Needs  . Financial resource strain: Not on file  . Food insecurity    Worry: Not on file    Inability: Not on file  . Transportation needs    Medical: Not on file    Non-medical: Not on file  Tobacco Use  . Smoking status: Never Smoker  . Smokeless tobacco: Never Used  Substance and Sexual Activity  . Alcohol use: No  . Drug use: No  . Sexual activity: Not on file  Lifestyle  . Physical activity    Days per week: Not on file    Minutes per session: Not on file  . Stress: Not on file  Relationships  . Social Herbalist on phone: Not on file    Gets together: Not on file    Attends religious service: Not on file    Active member of club or organization: Not on file    Attends meetings of clubs or organizations: Not on file    Relationship status: Not on file  Other Topics Concern  . 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     Family History:  The patient's family history includes Breast cancer in her maternal aunt and sister; Hypertension in her mother.   ROS:   Please see the history of present illness.    Review of Systems  Constitution: Positive for diaphoresis and malaise/fatigue.  Cardiovascular: Positive for dyspnea on exertion and irregular heartbeat.  Respiratory: Positive for snoring.   Skin: Positive for rash.  Musculoskeletal: Positive for back pain, joint swelling and myalgias.  Neurological: Positive for dizziness and headaches.   All other systems reviewed and are negative.   EKGs/Labs/Other Test Reviewed:    EKG:   07/09/16 SR rate 58 QT 438   Recent Labs: 02/28/2018: BUN 17; Creatinine, Ser 0.73; Hemoglobin 13.5; Magnesium 2.1; Platelets 275; Potassium 4.7; Sodium 141   Recent Lipid Panel No results found for: CHOL, TRIG, HDL, CHOLHDL, VLDL, LDLCALC, LDLDIRECT   Physical Exam:    VS:  BP 138/68   Pulse 65   Ht 5\' 8"  (1.727 m)   Wt 279 lb (126.6 kg)   SpO2 98%   BMI 42.42 kg/m     Wt Readings from Last 3  Encounters:  11/28/18 279 lb (126.6 kg)  11/21/18 280 lb 6.4 oz (127.2 kg)  02/28/18 274 lb 9.6 oz (124.6 kg)    Affect appropriate Healthy:  appears stated age HEENT: normal Neck supple with no adenopathy JVP normal no bruits no thyromegaly Lungs clear with no wheezing and good diaphragmatic motion Heart:  S1/S2 no murmur, no rub, gallop or click PMI normal Abdomen: benighn, BS positve, no tenderness, no AAA no bruit.  No HSM or HJR Distal pulses intact with no bruits No edema Neuro non-focal Skin warm and dry No muscular weakness   PLAN:    In order of problems listed above:  1. PAF:  Maintaining NSR on Tikosyn labs and QT ok f/u Dr [name]   2. HTN - Well controlled.  Continue current medications and low sodium Dash type diet.     3. OSA - F/U with Dr Claiborne Billings regarding issues with CPAP  4. DM:  januvia d/c by primary Consider Jardiance for cardioprotective effect  5. Dyspnea:  Likely from obesity and chronic bronchitis TTE done 07/20/17 EF 60-65% no valve disease Grade 2 diastolic dysfunction only taking aldactone as needed for edema  Jenkins Rouge

## 2018-11-16 DIAGNOSIS — L03031 Cellulitis of right toe: Secondary | ICD-10-CM | POA: Diagnosis not present

## 2018-11-16 DIAGNOSIS — M79674 Pain in right toe(s): Secondary | ICD-10-CM | POA: Diagnosis not present

## 2018-11-21 ENCOUNTER — Ambulatory Visit (INDEPENDENT_AMBULATORY_CARE_PROVIDER_SITE_OTHER): Payer: Medicare Other | Admitting: Internal Medicine

## 2018-11-21 ENCOUNTER — Encounter: Payer: Self-pay | Admitting: Internal Medicine

## 2018-11-21 ENCOUNTER — Other Ambulatory Visit: Payer: Self-pay

## 2018-11-21 VITALS — BP 128/80 | HR 59 | Ht 68.0 in | Wt 280.4 lb

## 2018-11-21 DIAGNOSIS — R001 Bradycardia, unspecified: Secondary | ICD-10-CM | POA: Diagnosis not present

## 2018-11-21 DIAGNOSIS — G4733 Obstructive sleep apnea (adult) (pediatric): Secondary | ICD-10-CM | POA: Diagnosis not present

## 2018-11-21 DIAGNOSIS — R0600 Dyspnea, unspecified: Secondary | ICD-10-CM | POA: Diagnosis not present

## 2018-11-21 DIAGNOSIS — I480 Paroxysmal atrial fibrillation: Secondary | ICD-10-CM | POA: Diagnosis not present

## 2018-11-21 DIAGNOSIS — I10 Essential (primary) hypertension: Secondary | ICD-10-CM

## 2018-11-21 DIAGNOSIS — Z9989 Dependence on other enabling machines and devices: Secondary | ICD-10-CM

## 2018-11-21 NOTE — Patient Instructions (Addendum)
Medication Instructions:  Your provider recommends that you continue on your current medications as directed. Please refer to the Current Medication list given to you today.    Labwork: Please see attached lab order. Please get serum potassium and magnesium if you have not had drawn in the last 3 months.  Testing/Procedures: None  Follow-Up: Your provider wants you to follow-up in: 6 months with Dr. [name].  You will receive a reminder letter in the mail two months in advance. If you don't receive a letter, please call our office to schedule the follow-up appointment.

## 2018-11-21 NOTE — Progress Notes (Signed)
Patient Care Team: [name], MD as PCP - General (Internal Medicine) [name], MD as PCP - Cardiology (Cardiology)   HPI  Dominique Bauer is a 73 y.o. female Seen in follow-up for atrial fibrillation for which she was admitted for dofetilide initiation 1/18. She was seen in the A. fib clinic 2/18 feeling much better in sinus rhythm.  She has a history of persistent long-standing persistent atrial fibrillation for which cardioversion with dofetilide surprisingly effective.    Complaints are of dyspnea on exertion.  No chest discomfort.  No edema.  She has treated sleep apnea.  No interval atrial fibrillation of which she is aware.  Weight continues to be an issue.    DATE TEST EF   11/17 Echo   65 %   4/19 Echo   65 % LAE mild mod          Date Cr K Mg Hgb  2/18  0.72 4.6 1.9   6/18 0.74 4.1 1.6   11/18 0.8 4.6 2.2 13.2           Past Medical History:  Diagnosis Date  . Body mass index 40.0-44.9, adult ([place])   . H/O measles   . H/O mumps   . H/O: whooping cough   . History of chicken pox   . Hypertension   . Persistent atrial fibrillation   . Sciatica   . Sleep apnea    not using aything at night right now/LH    Past Surgical History:  Procedure Laterality Date  . ABDOMINAL HYSTERECTOMY    . BREAST BIOPSY Right 02/01/2012  . BREAST EXCISIONAL BIOPSY Bilateral    benign  . CARDIOVERSION N/A 11/17/2015   Procedure: CARDIOVERSION;  Surgeon: [name], MD;  Location[facility] ENDOSCOPY;  Service: Cardiovascular;  Laterality: N/A;  . CARDIOVERSION N/A 01/07/2016   Procedure: CARDIOVERSION;  Surgeon: [name], MD;  Location: [place];  Service: Cardiovascular;  Laterality: N/A;  . CARDIOVERSION N/A 04/29/2016   Procedure: CARDIOVERSION;  Surgeon: Sanda Klein, MD;  Location: [facility] ENDOSCOPY;  Service: Cardiovascular;  Laterality: N/A;  . DG  BONE DENSITY ([place] HX)    . INNER EAR SURGERY    . NASAL SEPTUM SURGERY      Current  Outpatient Medications  Medication Sig Dispense Refill  . acetaminophen (TYLENOL) 650 MG CR tablet Take 650 mg by mouth every 8 (eight) hours as needed for pain (arthritis).    Marland Kitchen apixaban (ELIQUIS) 5 MG TABS tablet Take 5 mg by mouth 2 (two) times daily.    . benazepril (LOTENSIN) 40 MG tablet Take 40 mg by mouth daily.    Marland Kitchen BREO ELLIPTA 200-25 MCG/INH AEPB Inhale 1 puff into the lungs every morning. At the same time each day  0  . calcium carbonate (CALCIUM 600) 1500 (600 Ca) MG TABS tablet Take 1,500 mg by mouth daily with breakfast.    . calcium carbonate (TUMS - DOSED IN MG ELEMENTAL CALCIUM) 500 MG chewable tablet Chew 1 tablet by mouth daily as needed for indigestion or heartburn.    . dofetilide (TIKOSYN) 125 MCG capsule Take 3 capsules (375 mcg total) by mouth 2 (two) times daily. 540 capsule 3  . glimepiride (AMARYL) 2 MG tablet Take 2-4 mg by mouth as directed. Patient takes 2 tablets in the morning and 1 tablet at night    . L-LYSINE PO TAKE ONE TABLET BY MOUTH DAILY    . magnesium oxide (MAG-OX)  400 MG tablet TAKE 1 TABLET BY MOUTH TWICE A DAY 180 tablet 3  . metoprolol tartrate (LOPRESSOR) 25 MG tablet Take 25 mg by mouth. 1 tablet in the morning and 1/2 tablet in the evening    . multivitamin-lutein (OCUVITE-LUTEIN) CAPS capsule Take 1 capsule by mouth daily.    . Omega-3 Fatty Acids (FISH OIL) 1000 MG CAPS Take 1,000 mg by mouth 3 (three) times daily.     Marland Kitchen triamcinolone cream (KENALOG) 0.1 % Apply 1 application topically daily as needed. For rash     No current facility-administered medications for this visit.     Allergies  Allergen Reactions  . Penicillins Hives    severe  . Other     Cigarette smoke and certain perfumes - chest tightening and runny nose  . Neosporin [Neomycin-Bacitracin Zn-Polymyx] Rash      Review of Systems negative except from HPI and PMH  Physical Exam BP 128/80   Pulse (!) 59   Ht 5\' 8"  (1.727 m)   Wt 280 lb 6.4 oz (127.2 kg)   SpO2 97%    BMI 42.63 kg/m   BP 128/80   Pulse (!) 59   Ht 5\' 8"  (1.727 m)   Wt 280 lb 6.4 oz (127.2 kg)   SpO2 97%   BMI 42.63 kg/m  Well developed and Morbidly obese in no acute distress HENT normal Neck supple with JVP-flat Clear Regular rate and rhythm, no  murmur Abd-soft with active BS No Clubbing cyanosis   edema Skin-warm and dry A & Oriented  Grossly normal sensory and motor function  ECG sinus at 59 Interval 22/10/25 Left axis deviation -34    Assessment and  Plan Afib persistent  OSA  Hypertension  Sinus Bradycardia  DOE  High Risk Medication Surveillance  Morbidly obese   Tolerating dofetilide.  She had blood work by her PCP a few months ago.  I have ordered repeat blood work from her PCP whom she is seeing tomorrow.  We will need to see her in 6 months on her dofetilide.  Last potassium was hemoglobin were normal.  QT is normal today.  Also lengthy discussion regarding dyspnea on exertion which is likely related to morbid obesity and poor exercise capacity.  Have asked her to start walking 6 minutes twice a day and increase 1 minute/week.   We spent more than 50% of our >25 min visit in face to face counseling regarding the above    Current medicines are reviewed at length with the patient today .  The patient does not  have concerns regarding medicines.

## 2018-11-22 DIAGNOSIS — I10 Essential (primary) hypertension: Secondary | ICD-10-CM | POA: Diagnosis not present

## 2018-11-22 DIAGNOSIS — E1165 Type 2 diabetes mellitus with hyperglycemia: Secondary | ICD-10-CM | POA: Diagnosis not present

## 2018-11-22 DIAGNOSIS — R0602 Shortness of breath: Secondary | ICD-10-CM | POA: Diagnosis not present

## 2018-11-22 DIAGNOSIS — J45909 Unspecified asthma, uncomplicated: Secondary | ICD-10-CM | POA: Diagnosis not present

## 2018-11-28 ENCOUNTER — Ambulatory Visit (INDEPENDENT_AMBULATORY_CARE_PROVIDER_SITE_OTHER): Payer: Medicare Other | Admitting: Cardiovascular Disease

## 2018-11-28 ENCOUNTER — Other Ambulatory Visit: Payer: Self-pay

## 2018-11-28 VITALS — BP 138/68 | HR 65 | Ht 68.0 in | Wt 279.0 lb

## 2018-11-28 DIAGNOSIS — I480 Paroxysmal atrial fibrillation: Secondary | ICD-10-CM

## 2018-11-28 NOTE — Patient Instructions (Signed)
Your physician recommends that you continue on your current medications as directed. Please refer to the Current Medication list given to you today.   Your physician wants you to follow-up in:  6 MONTHS WITH DR NISHAN  You will receive a reminder letter in the mail two months in advance. If you don't receive a letter, please call our office to schedule the follow-up appointment. 

## 2018-11-30 DIAGNOSIS — L03031 Cellulitis of right toe: Secondary | ICD-10-CM | POA: Diagnosis not present

## 2018-11-30 DIAGNOSIS — E1151 Type 2 diabetes mellitus with diabetic peripheral angiopathy without gangrene: Secondary | ICD-10-CM | POA: Diagnosis not present

## 2018-12-11 DIAGNOSIS — E1165 Type 2 diabetes mellitus with hyperglycemia: Secondary | ICD-10-CM | POA: Diagnosis not present

## 2018-12-11 DIAGNOSIS — I10 Essential (primary) hypertension: Secondary | ICD-10-CM | POA: Diagnosis not present

## 2019-01-04 DIAGNOSIS — B3749 Other urogenital candidiasis: Secondary | ICD-10-CM | POA: Diagnosis not present

## 2019-01-04 DIAGNOSIS — E1165 Type 2 diabetes mellitus with hyperglycemia: Secondary | ICD-10-CM | POA: Diagnosis not present

## 2019-01-05 DIAGNOSIS — I480 Paroxysmal atrial fibrillation: Secondary | ICD-10-CM | POA: Diagnosis not present

## 2019-01-05 DIAGNOSIS — E1165 Type 2 diabetes mellitus with hyperglycemia: Secondary | ICD-10-CM | POA: Diagnosis not present

## 2019-01-09 ENCOUNTER — Telehealth: Payer: Self-pay | Admitting: Internal Medicine

## 2019-01-09 NOTE — Telephone Encounter (Signed)
I spoke to the patient with Dr Kyla Balzarine recommendation.  She verbalized understanding and will monitor for side effects.

## 2019-01-09 NOTE — Telephone Encounter (Signed)
New Message:   Pt said her family doctor have prescribed Tradjenta for her. She wants to know if this is alright for her to take with her Atrial Fib.   She said the side effects are it can cause CHF and fast heart beat.

## 2019-01-09 NOTE — Telephone Encounter (Signed)
Ok to use?

## 2019-01-09 NOTE — Telephone Encounter (Signed)
Called patient to let her know Dr. Kyla Balzarine recommendations. Patient verbalized understanding.

## 2019-02-08 ENCOUNTER — Ambulatory Visit: Payer: Medicare Other | Admitting: Cardiovascular Disease

## 2019-02-08 ENCOUNTER — Telehealth: Payer: Self-pay | Admitting: *Deleted

## 2019-02-08 ENCOUNTER — Other Ambulatory Visit: Payer: Self-pay

## 2019-02-08 ENCOUNTER — Other Ambulatory Visit: Payer: Self-pay | Admitting: Cardiovascular Disease

## 2019-02-08 VITALS — BP 136/70 | HR 75 | Temp 96.8°F | Ht 64.0 in | Wt 276.0 lb

## 2019-02-08 DIAGNOSIS — I10 Essential (primary) hypertension: Secondary | ICD-10-CM

## 2019-02-08 DIAGNOSIS — G4733 Obstructive sleep apnea (adult) (pediatric): Secondary | ICD-10-CM

## 2019-02-08 DIAGNOSIS — I480 Paroxysmal atrial fibrillation: Secondary | ICD-10-CM

## 2019-02-08 DIAGNOSIS — Z9989 Dependence on other enabling machines and devices: Secondary | ICD-10-CM

## 2019-02-08 DIAGNOSIS — E119 Type 2 diabetes mellitus without complications: Secondary | ICD-10-CM

## 2019-02-08 NOTE — Progress Notes (Signed)
Cardiology Office Note    Date:  02/10/2019   ID:  Dominique Bauer, DOB 11-07-1945, MRN [id number]  PCP:  [name], MD  Cardiologist:  Shelva Majestic, MD (sleep), Dr. Johnsie Cancel, Dr.. [name] (EP)  F/U sleep evaluation  History of Present Illness:  Dominique Bauer is a 73 y.o. female who presents for a 13 month follow-up sleep evaluation.  Dominique Bauer is a patient of Dr. Johnsie Cancel and and Dr. [name] who has a history of hypertension, and atrial fibrillation.  She had a long history of persistent atrial fibrillation and had failed previous antiarrhythmic therapy and cardioversions, but recently underwent successful cardioversion with dofetilide.  Her left atrial size is mildly enlarged at 43 mm.  Due to concerns for sleep apnea she was referred for diagnostic sleep study which was done on 02/15/2016.  This revealed severe obstructive sleep apnea with an AHI of 31.2 per hour.  Sleep apnea was more severe during Rehm sleep with an AHI of 58.3 per hour and with supine position at 46.1 per hour.  She had significant oxygen desaturation to a nadir of 69% on her diagnostic study.  She socially underwent a CPAP titration trial on 05/05/2016.  It was initially recommended that she use CPAP auto with a minimum pressure of 10 and maximum pressure of 18 cm water pressure with heated humidification.  She was unable to tolerate the high pressures and ultimately this was reduced to a maximum pressure of 16.  She has been using a fullface mask.  A download was obtained from 06/21/2016 through 07/20/2016.  This demonstrated excellent compliance with 100% usage stays at 97% of days with usage greater than 4 hours.  However, she was only sleeping 4 hours and 30 minutes.  There was no significant leak.  Her 95th percentile pressure was 13.4 with a maximum average pressure of 14.2. Upon further questioning, she states that she typically goes to bed between 11 PM and midnight and often is in bed until 10 AM in the  morning.  When I saw her, she was not using CPAP through the duration of her time in bed.  She felt improved since initiating CPAP.   I saw her in July 2018 and prior to that visit she was having difficulty with her mask with tenderness on the bridge of her nose.  At that time, I also had significant discussion with her and recommended the new Respironics dream were full face mask.  Apparently, she took a prescription to advance home [facility].  At the time they did not have this new mask which had just become available.  Over the past several months, she has significantly increased her sleep duration with CPAP use.  I obtained a new download in the office  from July 1-30, 2018 which demonstrated 100% compliancec with usage stays and uses greater than 4 hours and averaging 6 hours and 57 minutes of CPAP use per night.  She has ResMed AirSense 10 Auto system and has a minimum pressure setting at 10 and maximum of 14.  Her 95th percent.  Average pressure was 13.2.  At times she still having increased leak around her nasal bridge.  Her AHI was 1.1.  She was been unaware of any recurrent atrial fibrillation.    I last saw her in October 2019 at which time continued to use CPAP with 100% compliance.  I obtained a new download from September 22 through January 16, 2018 which confirmed 100% compliance.  Her ResMed  air sense auto set is set at a minimum pressure of 10 with maximum of 14.  95th percentile pressure is 11.6 with a maximum average pressure of 12.1.  AHI remains excellent at 0.5.  She feels well.  She denies any residual daytime sleepiness.  A new Epworth scale was calculated in the office  and this endorsed at 7.  She was unaware of breakthrough snoring.  She denied any  bruxism, restless legs, hypnagogic hallucinations or cataplexy.    Over the past year, she has continued to do well..  She last saw Dr. Alfonse Spruce in September 2020 and was maintaining sinus rhythm on Tikosyn and was without QTC prolongation.  Her  hypertension was controlled and she was on a low-sodium Dash type diet.  Presently, she continues to use CPAP.  A new download was obtained from October 13 through February 07, 2019 which confirms excellent compliance.  However she does have a mask leak.  She has been using a full facemask.  Her CPAP has been set at a minimum pressure of 10 with a maximum pressure of 14.  95 percentile pressure is 11.0 with a maximum average pressure of 11.8.  AHI is excellent at 0.4.  [employer] is her DME company who brought out advance home care.  Of note, for for family members including a daughter, son-in-law, and 2 grandchildren have come down with COVID-19 infection.  She has not had any recent exposure to them.  She presents for evaluation.  Past Medical History:  Diagnosis Date  . Body mass index 40.0-44.9, adult ([place])   . H/O measles   . H/O mumps   . H/O: whooping cough   . History of chicken pox   . Hypertension   . Persistent atrial fibrillation   . Sciatica   . Sleep apnea    not using aything at night right now/LH    Past Surgical History:  Procedure Laterality Date  . ABDOMINAL HYSTERECTOMY    . BREAST BIOPSY Right 02/01/2012  . BREAST EXCISIONAL BIOPSY Bilateral    benign  . CARDIOVERSION N/A 11/17/2015   Procedure: CARDIOVERSION;  Surgeon: [name], MD;  Location: [facility]COPY;  Service: Cardiovascular;  Laterality: N/A;  . CARDIOVERSION N/A 01/07/2016   Procedure: CARDIOVERSION;  Surgeon: [name], MD;  Location: [place];  Service: Cardiovascular;  Laterality: N/A;  . CARDIOVERSION N/A 04/29/2016   Procedure: CARDIOVERSION;  Surgeon: Sanda Klein, MD;  Location: [facility] ENDOSCOPY;  Service: Cardiovascular;  Laterality: N/A;  . DG  BONE DENSITY ([place] HX)    . INNER EAR SURGERY    . NASAL SEPTUM SURGERY      Current Medications: Outpatient Medications Prior to Visit  Medication Sig Dispense Refill  . acetaminophen (TYLENOL) 650 MG CR tablet Take 650 mg by mouth every 8  (eight) hours as needed for pain (arthritis).    Marland Kitchen apixaban (ELIQUIS) 5 MG TABS tablet Take 5 mg by mouth 2 (two) times daily.    . benazepril (LOTENSIN) 40 MG tablet Take 40 mg by mouth daily.    Marland Kitchen BREO ELLIPTA 200-25 MCG/INH AEPB Inhale 1 puff into the lungs every morning. At the same time each day  0  . calcium carbonate (CALCIUM 600) 1500 (600 Ca) MG TABS tablet Take 1,500 mg by mouth daily with breakfast.    . calcium carbonate (TUMS - DOSED IN MG ELEMENTAL CALCIUM) 500 MG chewable tablet Chew 1 tablet by mouth daily as needed for indigestion or heartburn.    Marland Kitchen  dofetilide (TIKOSYN) 125 MCG capsule Take 3 capsules (375 mcg total) by mouth 2 (two) times daily. 540 capsule 3  . ezetimibe (ZETIA) 10 MG tablet Take 10 mg by mouth daily.    Marland Kitchen glimepiride (AMARYL) 2 MG tablet Take 2-4 mg by mouth as directed. Patient takes 2 tablets in the morning and 1 tablet at night    . L-LYSINE PO TAKE ONE TABLET BY MOUTH DAILY    . linaGLIPtin (TRADJENTA PO) Take 5 mg by mouth every other day.    . magnesium oxide (MAG-OX) 400 MG tablet TAKE 1 TABLET BY MOUTH TWICE A DAY 180 tablet 3  . metoprolol tartrate (LOPRESSOR) 25 MG tablet Take 25 mg by mouth. 1 tablet in the morning and 1/2 tablet in the evening    . multivitamin-lutein (OCUVITE-LUTEIN) CAPS capsule Take 1 capsule by mouth daily.    . Omega-3 Fatty Acids (FISH OIL) 1000 MG CAPS Take 1,000 mg by mouth 3 (three) times daily.     Marland Kitchen triamcinolone cream (KENALOG) 0.1 % Apply 1 application topically daily as needed. For rash    . empagliflozin (JARDIANCE) 25 MG TABS tablet Take 25 mg by mouth daily.     No facility-administered medications prior to visit.      Allergies:   Penicillins, Other, and Neosporin [neomycin-bacitracin zn-polymyx]   Social History   Socioeconomic History  . Marital status: Married    Spouse name: Not on file  . Number of children: Not on file  . Years of education: Not on file  . Highest education level: Not on file   Occupational History  . Not on file  Social Needs  . Financial resource strain: Not on file  . Food insecurity    Worry: Not on file    Inability: Not on file  . Transportation needs    Medical: Not on file    Non-medical: Not on file  Tobacco Use  . Smoking status: Never Smoker  . Smokeless tobacco: Never Used  Substance and Sexual Activity  . Alcohol use: No  . Drug use: No  . Sexual activity: Not on file  Lifestyle  . Physical activity    Days per week: Not on file    Minutes per session: Not on file  . Stress: Not on file  Relationships  . Social Herbalist on phone: Not on file    Gets together: Not on file    Attends religious service: Not on file    Active member of club or organization: Not on file    Attends meetings of clubs or organizations: Not on file    Relationship status: Not on file  Other Topics Concern  . Not on file  Social History Narrative   Retired Marine scientist.  Lives in [place].     Family History:  The patient's family history includes Breast cancer in her maternal aunt and sister; Hypertension in her mother.   ROS General: Negative; No fevers, chills, or night sweats;  HEENT: Negative; No changes in vision or hearing, sinus congestion, difficulty swallowing Pulmonary: Negative; No cough, wheezing, shortness of breath, hemoptysis Cardiovascular: No recurrent AF GI: Negative; No nausea, vomiting, diarrhea, or abdominal pain GU: Negative; No dysuria, hematuria, or difficulty voiding Musculoskeletal: Negative; no myalgias, joint pain, or weakness Hematologic/Oncology: Negative; no easy bruising, bleeding Endocrine: Positive for diabetes Neuro: Negative; no changes in balance, headaches Skin: Negative; No rashes or skin lesions Psychiatric: Negative; No behavioral problems, depression Sleep:See HPI Other comprehensive  14 point system review is negative.   PHYSICAL EXAM:   VS:  BP 136/70 (BP Location: Left Arm, Patient Position: Sitting,  Cuff Size: Large)   Pulse 75   Temp (!) 96.8 F (36 C)   Ht 5\' 4"  (1.626 m)   Wt 276 lb (125.2 kg)   BMI 47.38 kg/m     Repeat blood pressure by me 128/72  Wt Readings from Last 3 Encounters:  02/08/19 276 lb (125.2 kg)  11/28/18 279 lb (126.6 kg)  11/21/18 280 lb 6.4 oz (127.2 kg)    General: Alert, oriented, no distress.  Skin: normal turgor, no rashes, warm and dry HEENT: Normocephalic, atraumatic. Pupils equal round and reactive to light; sclera anicteric; extraocular muscles intact;  Nose without nasal septal hypertrophy Mouth/Parynx benign; Mallinpatti scale 3 Neck: No JVD, no carotid bruits; normal carotid upstroke Lungs: clear to ausculatation and percussion; no wheezing or rales Chest wall: without tenderness to palpitation Heart: PMI not displaced, RRR, s1 s2 normal, 1/6 systolic murmur, no diastolic murmur, no rubs, gallops, thrills, or heaves Abdomen: soft, nontender; no hepatosplenomehaly, BS+; abdominal aorta nontender and not dilated by palpation. Back: no CVA tenderness Pulses 2+ Musculoskeletal: full range of motion, normal strength, no joint deformities Extremities: no clubbing cyanosis or edema, Homan's sign negative  Neurologic: grossly nonfocal; Cranial nerves grossly wnl Psychologic: Normal mood and affect   Studies/Labs Reviewed:   ECG (independently read by me): NSR at 75; First degree AV block PR 216 msec; QS V1-2.      October 2019 ECG (independently read by me): Sinus bradycardia at 56 bpm.  First-degree AV block with a PR interval of 210 ms.  Q waves inferiorly with preserved R waves  July 2018 ECG (independently read by me): Sinus bradycardia 53 bpm.  PR interval 192 ms.  QTc interval 457 ms.  Nonspecific T changes in lead 3.  April 2018 ECG (independently read by me): Sinus bradycardia 56 bpm.  Left axis deviation.  T-wave inversion in lead 3.  QS in V2.  QTc interval 420 ms.  PR interval 196 ms.  Recent Labs: BMP Latest Ref Rng & Units  02/28/2018 02/22/2017 09/24/2016  Glucose 65 - 99 mg/dL 129(H) 81 188(H)  BUN 8 - 27 mg/dL 17 19 14   Creatinine 0.57 - 1.00 mg/dL 0.73 0.80 0.74  BUN/Creat Ratio 12 - 28 23 24  -  Sodium 134 - 144 mmol/L 141 140 134(L)  Potassium 3.5 - 5.2 mmol/L 4.7 4.6 4.1  Chloride 96 - 106 mmol/L 100 101 102  CO2 20 - 29 mmol/L 23 23 24   Calcium 8.7 - 10.3 mg/dL 10.7(H) 10.8(H) 10.3     No flowsheet data found.  CBC Latest Ref Rng & Units 02/28/2018 02/22/2017 01/05/2016  WBC 3.4 - 10.8 x10E3/uL 10.3 10.3 9.3  Hemoglobin 11.1 - 15.9 g/dL 13.5 13.2 13.7  Hematocrit 34.0 - 46.6 % 39.6 37.7 40.8  Platelets 150 - 450 x10E3/uL 275 252 277   Lab Results  Component Value Date   MCV 85 02/28/2018   MCV 85 02/22/2017   MCV 89.7 01/05/2016   No results found for: TSH No results found for: HGBA1C   BNP No results found for: BNP  ProBNP No results found for: PROBNP   Lipid Panel  No results found for: CHOL, TRIG, HDL, CHOLHDL, VLDL, LDLCALC, LDLDIRECT   RADIOLOGY: No results found.   Additional studies/ records that were reviewed today include:  I had previously  read the office notes of Drs.  [name] admission, and reviewed her diagnostic polysomnogram as well as CPAP titration trial.  I obtained a download of her CPAP unit in the office today for compliance and efficacy assessment.  In the office today I obtained a new download from July 1 through 10/25/2016.   ASSESSMENT:    1. OSA on CPAP   2. Paroxysmal atrial fibrillation (HCC)   3. Essential hypertension   4. Morbid obesity ([place])   5. Type 2 diabetes mellitus without complication, without long-term current use of insulin [facility])     PLAN:  Dominique Bauer is a 73 year-old female who has a history of hypertension, and previous persistent atrial fibrillation which proved refractory to additional antiarrhythmics and prior cardioversions, but ultimately she was successfully cardioverted following a tikosyn load.  She was  found to  have severe obstructive sleep apnea with an overall AHI of 31.2 per hour and very severe sleep apnea during REM sleep with an AHI of 58.3 per hour.  She had marked oxygen desaturation to a nadir of 69% on her diagnostic study.  When I saw her for her initial sleep evaluation she was meeting compliance but was sleeping an adequate duration at only 4 hours and 30 minutes.  In prior evaluations I have had a lengthy discussion with her regarding the effects of obstructive sleep apnea on cardiovascular health and particularly with reference to its risk for atrial fibrillation and increased incidence of recurrence of atrial fibrillation if untreated.  We discussed optimal sleep duration.  On her most recent download, she continues to be compliant.  Sleep duration with CPAP use is 7 hours and 13 minutes.  Her AHI is excellent at 0.4 and her 95th percentile pressure is 11.0 with a maximum average pressure of 11.8.  She has a daily significant mask leak with a 95th percentile leak at 61.7 L/min which is significantly above the threshold of 24.0 L/min.  I recommend changing her full facemask to a ResMed air fit F 30i mask.  This mask will provide nasal as well as oral treatment but she will not have any mask component on the bridge of her nose which is undoubtedly accounting for her significant leak.  In addition with the tubing coming from the crown of the head she should be able to turn from side to side without restriction.  Her blood pressure today is stable on her current medical regimen consisting of benazepril 40 mg and metoprolol.  She continues to be on Eliquis for anticoagulation and Tikosyn 375 g by mouth 2 times per day and is maintaining sinus rhythm.  She is diabetic on Jardiance, glimepiride therapy.  She will follow up with [facility] admission.  I will see her in 1 year from a sleep perspective or sooner if problems arise.   On her most recent download, she is 100% compliant and is now averaging 7 hours and  28 minutes of CPAP use per night.  Her AHI is excellent at 0.5 and her 95th percentile pressure is 11.6.  Her blood pressure today is well controlled.  She is tolerating her new mask and does not have significant leak.  When I last saw her, she was  bradycardic in the low 50s and I reduced her beta-blocker therapy.  She is maintaining sinus rhythm and ventricular rate today is 56.  She continues to be on benazepril 40 mg and is now taking metoprolol 25 mg in the morning and 12.5 mg in the evening.  She continues to  be on Tikosyn 375 mcg twice a day.  She is not having any wheezing exacerbation and continues to be on Brio Ellipta.  She is diabetic on glimepiride.  She is morbidly obese with a BMI of almost 42.  We discussed the importance of weight loss and exercise.  From a sleep perspective, I will see her in 1 year for reevaluation.   Medication Adjustments/Labs and Tests Ordered: Current medicines are reviewed at length with the patient today.  Concerns regarding medicines are outlined above.  Medication changes, Labs and Tests ordered today are listed in the Patient Instructions below.  Patient Instructions  Medication Instructions:  Your physician recommends that you continue on your current medications as directed. Please refer to the Current Medication list given to you today.  *If you need a refill on your cardiac medications before your next appointment, please call your pharmacy*  Lab Work: none If you have labs (blood work) drawn today and your tests are completely normal, you will receive your results only by: Marland Kitchen MyChart Message (if you have MyChart) OR . A paper copy in the mail If you have any lab test that is abnormal or we need to change your treatment, we will call you to review the results.  Testing/Procedures: none  Follow-Up: At [facility], you and your health needs are our priority.  As part of our continuing mission to provide you with exceptional heart care, we have  created designated Provider Care Teams.  These Care Teams include your primary Cardiologist (physician) and Advanced Practice Providers (APPs -  Physician Assistants and Nurse Practitioners) who all work together to provide you with the care you need, when you need it.  Your next appointment:   12 months  The format for your next appointment:   In Person  Provider:   Shelva Majestic, MD  Other Instructions You will be contacted by the Sleep Coordinator, Barry Brunner, regarding your CPAP machine     Signed, Shelva Majestic, MD  02/10/2019 4:41 PM    [place] [address] [place], [place], [place]  [address] Phone: [phone]

## 2019-02-08 NOTE — Patient Instructions (Signed)
Medication Instructions:  Your physician recommends that you continue on your current medications as directed. Please refer to the Current Medication list given to you today.  *If you need a refill on your cardiac medications before your next appointment, please call your pharmacy*  Lab Work: none If you have labs (blood work) drawn today and your tests are completely normal, you will receive your results only by: Marland Kitchen MyChart Message (if you have MyChart) OR . A paper copy in the mail If you have any lab test that is abnormal or we need to change your treatment, we will call you to review the results.  Testing/Procedures: none  Follow-Up: At [facility], you and your health needs are our priority.  As part of our continuing mission to provide you with exceptional heart care, we have created designated Provider Care Teams.  These Care Teams include your primary Cardiologist (physician) and Advanced Practice Providers (APPs -  Physician Assistants and Nurse Practitioners) who all work together to provide you with the care you need, when you need it.  Your next appointment:   12 months  The format for your next appointment:   In Person  Provider:   Shelva Majestic, MD  Other Instructions You will be contacted by the Sleep Coordinator, Barry Brunner, regarding your CPAP machine

## 2019-02-08 NOTE — Telephone Encounter (Signed)
-----   Message from Annita Brod, RN sent at 02/08/2019 12:57 PM EST ----- Regarding: CPAP machine Hello,   Per Dr. Claiborne Billings, patient needs prescription for CPAP mask change.  ResMed AirFit F30i  Thank you

## 2019-02-08 NOTE — Telephone Encounter (Signed)
Order placed for [employer] in [employer] for F-30i mask.

## 2019-02-09 DIAGNOSIS — G4733 Obstructive sleep apnea (adult) (pediatric): Secondary | ICD-10-CM | POA: Diagnosis not present

## 2019-02-10 ENCOUNTER — Encounter: Payer: Self-pay | Admitting: Cardiovascular Disease

## 2019-02-13 DIAGNOSIS — I480 Paroxysmal atrial fibrillation: Secondary | ICD-10-CM | POA: Diagnosis not present

## 2019-02-13 DIAGNOSIS — E1165 Type 2 diabetes mellitus with hyperglycemia: Secondary | ICD-10-CM | POA: Diagnosis not present

## 2019-02-21 DIAGNOSIS — I10 Essential (primary) hypertension: Secondary | ICD-10-CM | POA: Diagnosis not present

## 2019-02-21 DIAGNOSIS — E1165 Type 2 diabetes mellitus with hyperglycemia: Secondary | ICD-10-CM | POA: Diagnosis not present

## 2019-02-21 DIAGNOSIS — E7849 Other hyperlipidemia: Secondary | ICD-10-CM | POA: Diagnosis not present

## 2019-03-01 DIAGNOSIS — L84 Corns and callosities: Secondary | ICD-10-CM | POA: Diagnosis not present

## 2019-03-01 DIAGNOSIS — M79676 Pain in unspecified toe(s): Secondary | ICD-10-CM | POA: Diagnosis not present

## 2019-03-01 DIAGNOSIS — E1142 Type 2 diabetes mellitus with diabetic polyneuropathy: Secondary | ICD-10-CM | POA: Diagnosis not present

## 2019-03-01 DIAGNOSIS — B351 Tinea unguium: Secondary | ICD-10-CM | POA: Diagnosis not present

## 2019-03-12 DIAGNOSIS — I10 Essential (primary) hypertension: Secondary | ICD-10-CM | POA: Diagnosis not present

## 2019-03-12 DIAGNOSIS — E1165 Type 2 diabetes mellitus with hyperglycemia: Secondary | ICD-10-CM | POA: Diagnosis not present

## 2019-03-12 DIAGNOSIS — E7849 Other hyperlipidemia: Secondary | ICD-10-CM | POA: Diagnosis not present

## 2019-03-26 ENCOUNTER — Other Ambulatory Visit: Payer: Self-pay | Admitting: Internal Medicine

## 2019-03-28 ENCOUNTER — Telehealth: Payer: Self-pay

## 2019-03-28 NOTE — Telephone Encounter (Signed)
Tried to call patient. Left message for patient to call back. Medication was taken off patient's list on 11/21/18. Per Dr. Johnsie Cancel, last office visit note (11/28/18), patient takes aldactone as needed for edema. Will forward to Dr. Johnsie Cancel to clarify.

## 2019-03-28 NOTE — Telephone Encounter (Signed)
Can call it back in as daily but let her take it as needed

## 2019-03-29 MED ORDER — SPIRONOLACTONE 25 MG PO TABS: 12.5000 mg | ORAL_TABLET | Freq: Every day | ORAL | 3 refills | Status: DC

## 2019-03-29 NOTE — Telephone Encounter (Signed)
Patient called back. Informed patient that medication was sent to her pharmacy. Patient verbalized understanding and thanked [place] for our help.

## 2019-03-29 NOTE — Addendum Note (Signed)
Addended by: Aris Georgia, Tarl Cephas L on: 03/29/2019 08:05 AM   Modules accepted: Orders

## 2019-04-10 DIAGNOSIS — E7849 Other hyperlipidemia: Secondary | ICD-10-CM | POA: Diagnosis not present

## 2019-04-10 DIAGNOSIS — E1165 Type 2 diabetes mellitus with hyperglycemia: Secondary | ICD-10-CM | POA: Diagnosis not present

## 2019-04-10 DIAGNOSIS — I10 Essential (primary) hypertension: Secondary | ICD-10-CM | POA: Diagnosis not present

## 2019-04-16 ENCOUNTER — Other Ambulatory Visit: Payer: Self-pay | Admitting: Internal Medicine

## 2019-05-09 DIAGNOSIS — E7849 Other hyperlipidemia: Secondary | ICD-10-CM | POA: Diagnosis not present

## 2019-05-09 DIAGNOSIS — E1165 Type 2 diabetes mellitus with hyperglycemia: Secondary | ICD-10-CM | POA: Diagnosis not present

## 2019-05-09 DIAGNOSIS — I10 Essential (primary) hypertension: Secondary | ICD-10-CM | POA: Diagnosis not present

## 2019-05-21 ENCOUNTER — Telehealth: Payer: Self-pay

## 2019-05-21 NOTE — Telephone Encounter (Signed)
Called patient with the following message.   We are recommending the COVID-19 vaccine to all of our patients. Cardiac medications (including blood thinners) should not deter anyone from being vaccinated and there is no need to hold any of those medications prior to vaccine administration.  Patient verbalized understanding.

## 2019-05-21 NOTE — Telephone Encounter (Signed)
Patient called, she states that she is on Tikosyn and Eliquis. Patient would like to know if it is safe for her to get the Covid vaccine.

## 2019-05-22 DIAGNOSIS — I10 Essential (primary) hypertension: Secondary | ICD-10-CM | POA: Diagnosis not present

## 2019-05-22 DIAGNOSIS — E1165 Type 2 diabetes mellitus with hyperglycemia: Secondary | ICD-10-CM | POA: Diagnosis not present

## 2019-05-22 DIAGNOSIS — E7849 Other hyperlipidemia: Secondary | ICD-10-CM | POA: Diagnosis not present

## 2019-05-23 NOTE — Progress Notes (Signed)
Cardiology Office Note:    Date:  05/23/2019   ID:  Charlsie Venturi, DOB 1945-10-17, MRN [id number]  PCP:  [name], MD  Cardiologist:  Dr. Jenkins Rouge   Electrophysiologist:  n/a  Referring MD: [name], MD   No chief complaint on file.   History of Present Illness:    74 y.o. with PAF. History of HTN and OSA. Failed DCC alone and on flecainide 01/07/16. Subsequently begun on Tikosyn in hospital with successful [facility] 04/29/16.  Seen by Dr [name] 02/22/17 and beta blocker stopped due to relative bradycardia Last TTE done April 2019 No valve disease normal EF mild to moderate LAE   CHADS2-VASc=3 (female, 74 yo, HTN).     Uses CPAP for OSA.  Sees Dede Query d/c 2019  due to frequent infections on it  Some mild LE edema Primary placed her on HCTZ stopped by SK and wanted her to use Aldactone to prevent low K with Tikosyn Also started on statin K 4.7 on labs 02/28/18  No bleeding issues on Eliquis   On amaryl and tradjenta   Got first vaccine already Daughter and her family had [place] but she and her husband Have been fine   Prior CV studies that were reviewed today include:    ETT 12/04/15 Blood pressure demonstrated a normal response to exercise. There was no ST segment deviation noted during stress. Negative, adequate stress test.  TTE done 07/18/17 EF 60-65% no valve disease normal PA estimate grade 2 diastolic dysfunction  Past Medical History:  Diagnosis Date  . Body mass index 40.0-44.9, adult ([place])   . H/O measles   . H/O mumps   . H/O: whooping cough   . History of chicken pox   . Hypertension   . Persistent atrial fibrillation ([place])   . Sciatica   . Sleep apnea    not using aything at night right now/LH    Past Surgical History:  Procedure Laterality Date  . ABDOMINAL HYSTERECTOMY    . BREAST BIOPSY Right 02/01/2012  . BREAST EXCISIONAL BIOPSY Bilateral    benign  . CARDIOVERSION N/A 11/17/2015   Procedure: CARDIOVERSION;  Surgeon: [name], MD;  Location: [facility] ENDOSCOPY;  Service: Cardiovascular;  Laterality: N/A;  . CARDIOVERSION N/A 01/07/2016   Procedure: CARDIOVERSION;  Surgeon: [name], MD;  Location: [place];  Service: Cardiovascular;  Laterality: N/A;  . CARDIOVERSION N/A 04/29/2016   Procedure: CARDIOVERSION;  Surgeon: Sanda Klein, MD;  Location: [facility] ENDOSCOPY;  Service: Cardiovascular;  Laterality: N/A;  . DG  BONE DENSITY ([place] HX)    . INNER EAR SURGERY    . NASAL SEPTUM SURGERY      Current Medications: No outpatient medications have been marked as taking for the 05/28/19 encounter (Appointment) with [name], MD.     Allergies:   Penicillins, Other, and Neosporin [neomycin-bacitracin zn-polymyx]   Social History   Socioeconomic History  . Marital status: Married    Spouse name: Not on file  . Number of children: Not on file  . Years of education: Not on file  . Highest education level: Not on file  Occupational History  . Not on file  Tobacco Use  . Smoking status: Never Smoker  . Smokeless tobacco: Never Used  Substance and Sexual Activity  . Alcohol use: No  . Drug use: No  . Sexual activity: Not on file  Other Topics Concern  . 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   Social Determinants of Health   Financial Resource Strain:   . Difficulty of Paying Living Expenses: Not on file  Food Insecurity:   . Worried About Charity fundraiser in the Last Year: Not on file  . Ran Out of Food in the Last Year: Not on file  Transportation Needs:   . Lack of Transportation (Medical): Not on file  . Lack of Transportation (Non-Medical): Not on file  Physical Activity:   . Days of Exercise per Week: Not on file  . Minutes of Exercise per Session: Not on file  Stress:   . Feeling of Stress : Not on file  Social Connections:   . Frequency of Communication with Friends and Family: Not on file  . Frequency of Social Gatherings with Friends and  Family: Not on file  . Attends Religious Services: Not on file  . Active Member of Clubs or Organizations: Not on file  . Attends Archivist Meetings: Not on file  . Marital Status: Not on file     Family History:  The patient's family history includes Breast cancer in her maternal aunt and sister; Hypertension in her mother.   ROS:   Please see the history of present illness.    Review of Systems  Constitution: Positive for diaphoresis and malaise/fatigue.  Cardiovascular: Positive for dyspnea on exertion and irregular heartbeat.  Respiratory: Positive for snoring.   Skin: Positive for rash.  Musculoskeletal: Positive for back pain, joint swelling and myalgias.  Neurological: Positive for dizziness and headaches.   All other systems reviewed and are negative.   EKGs/Labs/Other Test Reviewed:    EKG:   07/09/16 SR rate 58 QT 438  Recent Labs: No results found for requested labs within last 8760 hours.   Recent Lipid Panel No results found for: CHOL, TRIG, HDL, CHOLHDL, VLDL, LDLCALC, LDLDIRECT   Physical Exam:    VS:  There were no vitals taken for this visit.    Wt Readings from Last 3 Encounters:  02/08/19 276 lb (125.2 kg)  11/28/18 279 lb (126.6 kg)  11/21/18 280 lb 6.4 oz (127.2 kg)    Affect appropriate Overweight white female  HEENT: normal Neck supple with no adenopathy JVP normal no bruits no thyromegaly Lungs clear with no wheezing and good diaphragmatic motion Heart:  S1/S2 no murmur, no rub, gallop or click PMI normal Abdomen: benighn, BS positve, no tenderness, no AAA no bruit.  No HSM or HJR Distal pulses intact with no bruits No edema Neuro non-focal Skin warm and dry No muscular weakness   PLAN:    In order of problems listed above:  1. PAF:  Maintaining NSR on Tikosyn labs and QT ok f/u Dr [name]   2. HTN - Well controlled.  Continue current medications and low sodium Dash type diet.     3. OSA - F/U with Dr Claiborne Billings regarding  issues with CPAP  4. DM:  januvia d/c by primary on amaryl and tradjenta   5. Dyspnea:  Likely from obesity and chronic bronchitis TTE done 07/20/17 EF 60-65% no valve disease Grade 2 diastolic dysfunction only taking aldactone as needed for edema  Jenkins Rouge

## 2019-05-28 ENCOUNTER — Other Ambulatory Visit: Payer: Self-pay

## 2019-05-28 ENCOUNTER — Encounter: Payer: Self-pay | Admitting: Cardiovascular Disease

## 2019-05-28 ENCOUNTER — Ambulatory Visit: Payer: Medicare Other | Admitting: Cardiovascular Disease

## 2019-05-28 VITALS — BP 122/74 | HR 68 | Ht 68.0 in | Wt 273.0 lb

## 2019-05-28 DIAGNOSIS — I480 Paroxysmal atrial fibrillation: Secondary | ICD-10-CM

## 2019-05-28 NOTE — Patient Instructions (Signed)

## 2019-05-31 DIAGNOSIS — M79676 Pain in unspecified toe(s): Secondary | ICD-10-CM | POA: Diagnosis not present

## 2019-05-31 DIAGNOSIS — L84 Corns and callosities: Secondary | ICD-10-CM | POA: Diagnosis not present

## 2019-05-31 DIAGNOSIS — E1142 Type 2 diabetes mellitus with diabetic polyneuropathy: Secondary | ICD-10-CM | POA: Diagnosis not present

## 2019-05-31 DIAGNOSIS — B351 Tinea unguium: Secondary | ICD-10-CM | POA: Diagnosis not present

## 2019-06-07 DIAGNOSIS — E7849 Other hyperlipidemia: Secondary | ICD-10-CM | POA: Diagnosis not present

## 2019-06-07 DIAGNOSIS — I10 Essential (primary) hypertension: Secondary | ICD-10-CM | POA: Diagnosis not present

## 2019-06-07 DIAGNOSIS — E1165 Type 2 diabetes mellitus with hyperglycemia: Secondary | ICD-10-CM | POA: Diagnosis not present

## 2019-06-10 DIAGNOSIS — R001 Bradycardia, unspecified: Secondary | ICD-10-CM | POA: Insufficient documentation

## 2019-06-11 ENCOUNTER — Other Ambulatory Visit: Payer: Self-pay

## 2019-06-11 ENCOUNTER — Encounter: Payer: Self-pay | Admitting: Internal Medicine

## 2019-06-11 ENCOUNTER — Ambulatory Visit: Payer: Medicare Other | Admitting: Internal Medicine

## 2019-06-11 VITALS — BP 112/66 | HR 58 | Ht 68.0 in | Wt 277.0 lb

## 2019-06-11 DIAGNOSIS — I4819 Other persistent atrial fibrillation: Secondary | ICD-10-CM

## 2019-06-11 DIAGNOSIS — R001 Bradycardia, unspecified: Secondary | ICD-10-CM | POA: Diagnosis not present

## 2019-06-11 MED ORDER — FUROSEMIDE 40 MG PO TABS: ORAL_TABLET | ORAL | 0 refills | Status: DC

## 2019-06-11 NOTE — Progress Notes (Signed)
Patient Care Team: [name], MD as PCP - General (Internal Medicine) [name], MD as PCP - Cardiology (Cardiology)   HPI  Dominique Bauer is a 74 y.o. female Seen in follow-up for atrial fibrillation for which she was admitted for dofetilide initiation 1/18. She was seen in the A. fib clinic 2/18 feeling much better in sinus rhythm.  She has a history of persistent long-standing persistent atrial fibrillation for which cardioversion with dofetilide surprisingly effective.    No interval atrial fibrillation of which she is aware.  Exercise is limited both by orthopedic issues i.e. pain in hips and back as well as dyspnea.  Struggles with some edema.  Diet is replete with sodium  Blood work per PCP.  Does not have the results.  Takes her Aldactone infrequently.    DATE TEST EF   11/17 Echo   65 %   4/19 Echo   65 % LAE mild mod          Date Cr K Mg Hgb  2/18  0.72 4.6 1.9   6/18 0.74 4.1 1.6   11/18 0.8 4.6 2.2 13.2           Past Medical History:  Diagnosis Date  . Body mass index 40.0-44.9, adult ([place])   . H/O measles   . H/O mumps   . H/O: whooping cough   . History of chicken pox   . Hypertension   . Persistent atrial fibrillation ([place])   . Sciatica   . Sleep apnea    not using aything at night right now/LH    Past Surgical History:  Procedure Laterality Date  . ABDOMINAL HYSTERECTOMY    . BREAST BIOPSY Right 02/01/2012  . BREAST EXCISIONAL BIOPSY Bilateral    benign  . CARDIOVERSION N/A 11/17/2015   Procedure: CARDIOVERSION;  Surgeon: [name], MD;  Location: [facility] ENDOSCOPY;  Service: Cardiovascular;  Laterality: N/A;  . CARDIOVERSION N/A 01/07/2016   Procedure: CARDIOVERSION;  Surgeon: [name], MD;  Location: [place];  Service: Cardiovascular;  Laterality: N/A;  . CARDIOVERSION N/A 04/29/2016   Procedure: CARDIOVERSION;  Surgeon: Sanda Harbert Fitterer, MD;  Location: [facility] ENDOSCOPY;  Service: Cardiovascular;   Laterality: N/A;  . DG  BONE DENSITY ([place] HX)    . INNER EAR SURGERY    . NASAL SEPTUM SURGERY      Current Outpatient Medications  Medication Sig Dispense Refill  . acetaminophen (TYLENOL) 650 MG CR tablet Take 650 mg by mouth every 8 (eight) hours as needed for pain (arthritis).    Marland Kitchen apixaban (ELIQUIS) 5 MG TABS tablet Take 5 mg by mouth 2 (two) times daily.    . benazepril (LOTENSIN) 40 MG tablet Take 40 mg by mouth daily.    Marland Kitchen BREO ELLIPTA 200-25 MCG/INH AEPB Inhale 1 puff into the lungs every morning. At the same time each day  0  . calcium carbonate (CALCIUM 600) 1500 (600 Ca) MG TABS tablet Take 1,500 mg by mouth daily with breakfast.    . calcium carbonate (TUMS - DOSED IN MG ELEMENTAL CALCIUM) 500 MG chewable tablet Chew 1 tablet by mouth daily as needed for indigestion or heartburn.    . cetirizine (ZYRTEC) 5 MG tablet Take 5 mg by mouth daily.    Marland Kitchen dofetilide (TIKOSYN) 125 MCG capsule TAKE THREE CAPSULES BY MOUTH TWICE DAILY 540 capsule 1  . ezetimibe (ZETIA) 10 MG tablet Take 10 mg by mouth daily.    Marland Kitchen  glimepiride (AMARYL) 2 MG tablet Take 2-4 mg by mouth as directed. Patient takes 2 tablets in the morning and 1 tablet at night    . guaiFENesin (MUCINEX) 600 MG 12 hr tablet Take 600 mg by mouth 2 (two) times daily.    Marland Kitchen HYDROcodone-acetaminophen (NORCO/VICODIN) 5-325 MG tablet Take 1 tablet by mouth every 6 (six) hours as needed for moderate pain.    . L-LYSINE PO TAKE ONE TABLET BY MOUTH DAILY    . linaGLIPtin (TRADJENTA PO) Take 5 mg by mouth every other day.    . magnesium oxide (MAG-OX) 400 MG tablet TAKE 1 TABLET BY MOUTH TWICE A DAY 180 tablet 3  . metoprolol tartrate (LOPRESSOR) 25 MG tablet Take 25 mg by mouth. 1 tablet in the morning and 1/2 tablet in the evening    . Multiple Vitamins-Minerals (ZINC PO) Take 1 tablet by mouth daily.    . multivitamin-lutein (OCUVITE-LUTEIN) CAPS capsule Take 1 capsule by mouth daily.    . Omega-3 Fatty Acids (FISH OIL) 1000 MG CAPS  Take 1,000 mg by mouth 3 (three) times daily.     Marland Kitchen spironolactone (ALDACTONE) 25 MG tablet Take 0.5 tablets (12.5 mg total) by mouth daily. 45 tablet 3  . triamcinolone cream (KENALOG) 0.1 % Apply 1 application topically daily as needed. For rash     No current facility-administered medications for this visit.    Allergies  Allergen Reactions  . Penicillins Hives    severe  . Other     Cigarette smoke and certain perfumes - chest tightening and runny nose  . Neosporin [Neomycin-Bacitracin Zn-Polymyx] Rash      Review of Systems negative except from HPI and PMH  Physical Exam   BP 112/66   Pulse (!) 58   Ht 5\' 8"  (1.727 m)   Wt 277 lb (125.6 kg)   SpO2 97%   BMI 42.12 kg/m  Well developed and nourished in no acute distress HENT normal Neck supple with JVP-8 cm Clear Regular rate and rhythm, no murmurs or gallops Abd-soft with active BS No Clubbing cyanosis 1-2+ edema Skin-warm and dry A & Oriented  Grossly normal sensory and motor function  ECG sinus at 58 Intervals 20/10/43    Assessment and  Plan Afib persistent  OSA  Hypertension  Sinus Bradycardia  DOE  HFpEF-chronic  High Risk Medication Surveillance aldactone/dofetilide     Blood pressures well controlled.  Mild volume overload with JVP and edema.  We will put her on furosemide 40 mg every other day x7 days then she can use it as needed q. OD x2 doses.  She thinks she has her blood work at home.  I have asked her to call us when she gets home and let us know what her most recent potassium level was.  Also her hemoglobin.  No clinical bleeding.  No atrial fibrillation of which she is aware.   Current medicines are reviewed at length with the patient today .  The patient does not  have concerns regarding medicines.

## 2019-06-11 NOTE — Patient Instructions (Signed)
Medication Instructions:  Your physician has recommended you make the following change in your medication:  ** Add Lasix 40mg  by mouth every other day x 7 days.   After that you may take as needed every other day x 2 doses for swelling.  *If you need a refill on your cardiac medications before your next appointment, please call your pharmacy*   Lab Work: None ordered.  If you have labs (blood work) drawn today and your tests are completely normal, you will receive your results only by: Marland Kitchen MyChart Message (if you have MyChart) OR . A paper copy in the mail If you have any lab test that is abnormal or we need to change your treatment, we will call you to review the results.   Testing/Procedures: None ordered.    Follow-Up: At [facility], you and your health needs are our priority.  As part of our continuing mission to provide you with exceptional heart care, we have created designated Provider Care Teams.  These Care Teams include your primary Cardiologist (physician) and Advanced Practice Providers (APPs -  Physician Assistants and Nurse Practitioners) who all work together to provide you with the care you need, when you need it.  We recommend signing up for the patient portal called "MyChart".  Sign up information is provided on this After Visit Summary.  MyChart is used to connect with patients for Virtual Visits (Telemedicine).  Patients are able to view lab/test results, encounter notes, upcoming appointments, etc.  Non-urgent messages can be sent to your provider as well.   To learn more about what you can do with MyChart, go to [email or web address].    Your next appointment:   6 months  The format for your next appointment:   In person  Provider:   Dr [name]

## 2019-06-12 ENCOUNTER — Telehealth: Payer: Self-pay | Admitting: Internal Medicine

## 2019-06-12 NOTE — Telephone Encounter (Signed)
  Pt is calling, wanted to know if Dr. [name] received her lab work from her PCP to know her potassium level   Please call

## 2019-06-14 DIAGNOSIS — G4733 Obstructive sleep apnea (adult) (pediatric): Secondary | ICD-10-CM | POA: Diagnosis not present

## 2019-06-15 NOTE — Telephone Encounter (Signed)
Spoke with pt and advised  copy of labs have been received from PCP and placed for Dr Olin Pia review.  Pt verbalizes understanding and thanked Therapist, sports for call.

## 2019-07-05 DIAGNOSIS — E1165 Type 2 diabetes mellitus with hyperglycemia: Secondary | ICD-10-CM | POA: Diagnosis not present

## 2019-07-05 DIAGNOSIS — I10 Essential (primary) hypertension: Secondary | ICD-10-CM | POA: Diagnosis not present

## 2019-07-05 DIAGNOSIS — E7849 Other hyperlipidemia: Secondary | ICD-10-CM | POA: Diagnosis not present

## 2019-07-24 DIAGNOSIS — L309 Dermatitis, unspecified: Secondary | ICD-10-CM | POA: Diagnosis not present

## 2019-07-30 DIAGNOSIS — H6122 Impacted cerumen, left ear: Secondary | ICD-10-CM | POA: Diagnosis not present

## 2019-08-03 DIAGNOSIS — I10 Essential (primary) hypertension: Secondary | ICD-10-CM | POA: Diagnosis not present

## 2019-08-03 DIAGNOSIS — I4891 Unspecified atrial fibrillation: Secondary | ICD-10-CM | POA: Diagnosis not present

## 2019-08-20 DIAGNOSIS — Z0000 Encounter for general adult medical examination without abnormal findings: Secondary | ICD-10-CM | POA: Diagnosis not present

## 2019-08-20 DIAGNOSIS — E7849 Other hyperlipidemia: Secondary | ICD-10-CM | POA: Diagnosis not present

## 2019-08-20 DIAGNOSIS — E1165 Type 2 diabetes mellitus with hyperglycemia: Secondary | ICD-10-CM | POA: Diagnosis not present

## 2019-08-20 DIAGNOSIS — I10 Essential (primary) hypertension: Secondary | ICD-10-CM | POA: Diagnosis not present

## 2019-09-07 DIAGNOSIS — E1165 Type 2 diabetes mellitus with hyperglycemia: Secondary | ICD-10-CM | POA: Diagnosis not present

## 2019-09-07 DIAGNOSIS — I10 Essential (primary) hypertension: Secondary | ICD-10-CM | POA: Diagnosis not present

## 2019-09-07 DIAGNOSIS — E7849 Other hyperlipidemia: Secondary | ICD-10-CM | POA: Diagnosis not present

## 2019-09-13 DIAGNOSIS — L84 Corns and callosities: Secondary | ICD-10-CM | POA: Diagnosis not present

## 2019-09-13 DIAGNOSIS — B351 Tinea unguium: Secondary | ICD-10-CM | POA: Diagnosis not present

## 2019-09-13 DIAGNOSIS — M79676 Pain in unspecified toe(s): Secondary | ICD-10-CM | POA: Diagnosis not present

## 2019-09-13 DIAGNOSIS — E1142 Type 2 diabetes mellitus with diabetic polyneuropathy: Secondary | ICD-10-CM | POA: Diagnosis not present

## 2019-09-18 DIAGNOSIS — G4733 Obstructive sleep apnea (adult) (pediatric): Secondary | ICD-10-CM | POA: Diagnosis not present

## 2019-10-05 DIAGNOSIS — E1165 Type 2 diabetes mellitus with hyperglycemia: Secondary | ICD-10-CM | POA: Diagnosis not present

## 2019-10-05 DIAGNOSIS — E7849 Other hyperlipidemia: Secondary | ICD-10-CM | POA: Diagnosis not present

## 2019-10-05 DIAGNOSIS — I10 Essential (primary) hypertension: Secondary | ICD-10-CM | POA: Diagnosis not present

## 2019-10-06 ENCOUNTER — Other Ambulatory Visit: Payer: Self-pay | Admitting: Internal Medicine

## 2019-11-01 ENCOUNTER — Other Ambulatory Visit: Payer: Self-pay | Admitting: Internal Medicine

## 2019-11-01 DIAGNOSIS — Z1231 Encounter for screening mammogram for malignant neoplasm of breast: Secondary | ICD-10-CM

## 2019-11-06 ENCOUNTER — Ambulatory Visit: Payer: Medicare Other

## 2019-11-08 DIAGNOSIS — E7849 Other hyperlipidemia: Secondary | ICD-10-CM | POA: Diagnosis not present

## 2019-11-08 DIAGNOSIS — I10 Essential (primary) hypertension: Secondary | ICD-10-CM | POA: Diagnosis not present

## 2019-11-08 DIAGNOSIS — E1165 Type 2 diabetes mellitus with hyperglycemia: Secondary | ICD-10-CM | POA: Diagnosis not present

## 2019-11-15 ENCOUNTER — Ambulatory Visit
Admission: RE | Admit: 2019-11-15 | Discharge: 2019-11-15 | Disposition: A | Discharge status: Home / Self Care | Payer: Medicare Other | Source: Ambulatory Visit | Attending: Internal Medicine | Admitting: Internal Medicine

## 2019-11-15 ENCOUNTER — Other Ambulatory Visit: Payer: Self-pay

## 2019-11-15 DIAGNOSIS — Z1231 Encounter for screening mammogram for malignant neoplasm of breast: Secondary | ICD-10-CM

## 2019-11-20 DIAGNOSIS — Z0000 Encounter for general adult medical examination without abnormal findings: Secondary | ICD-10-CM | POA: Diagnosis not present

## 2019-11-20 DIAGNOSIS — E1165 Type 2 diabetes mellitus with hyperglycemia: Secondary | ICD-10-CM | POA: Diagnosis not present

## 2019-11-20 DIAGNOSIS — E7849 Other hyperlipidemia: Secondary | ICD-10-CM | POA: Diagnosis not present

## 2019-11-20 DIAGNOSIS — I10 Essential (primary) hypertension: Secondary | ICD-10-CM | POA: Diagnosis not present

## 2019-11-30 DIAGNOSIS — Z0000 Encounter for general adult medical examination without abnormal findings: Secondary | ICD-10-CM | POA: Diagnosis not present

## 2019-11-30 DIAGNOSIS — E7849 Other hyperlipidemia: Secondary | ICD-10-CM | POA: Diagnosis not present

## 2019-11-30 DIAGNOSIS — E1165 Type 2 diabetes mellitus with hyperglycemia: Secondary | ICD-10-CM | POA: Diagnosis not present

## 2019-11-30 DIAGNOSIS — I10 Essential (primary) hypertension: Secondary | ICD-10-CM | POA: Diagnosis not present

## 2019-12-06 DIAGNOSIS — E1165 Type 2 diabetes mellitus with hyperglycemia: Secondary | ICD-10-CM | POA: Diagnosis not present

## 2019-12-06 DIAGNOSIS — E7849 Other hyperlipidemia: Secondary | ICD-10-CM | POA: Diagnosis not present

## 2019-12-06 DIAGNOSIS — I10 Essential (primary) hypertension: Secondary | ICD-10-CM | POA: Diagnosis not present

## 2019-12-11 NOTE — Progress Notes (Signed)
Cardiology Office Note:    Date:  12/25/2019   ID:  Galvin Proffer, DOB [birth year], MRN [id number]  PCP:  [name], MD  Cardiologist:  Dr. Jenkins Rouge   Electrophysiologist:  n/a  Referring MD: [name], MD   No chief complaint on file.   History of Present Illness:    74 y.o. with PAF. History of HTN and OSA. Failed DCC alone and on flecainide 01/07/16. Subsequently begun on Tikosyn in hospital with successful [facility] 04/29/16.  Seen by Dr [name] 02/22/17 and beta blocker stopped due to relative bradycardia Last TTE done April 2019 No valve disease normal EF mild to moderate LAE   CHADS2-VASc=3 (female, 74 yo, HTN).     Uses CPAP for OSA.  Sees Dede Query d/c 2019  due to frequent infections on it  She has some LE edema. Dr [name] had her take lasix for 7 days in March but then PRN Avoid low K on Tikosyn. She takes aldactone daily BUN/K/Cr normal March 2021 on lab review from primary   Has had COVID vaccine  Primary put her on Nexlitol in July but she has not taken it Concerned about side effects Don't know what her labs are like Statins not listed as allergy but she indicates not tolerating Them with significant myalgias   Prior CV studies that were reviewed today include:    ETT 12/04/15 Blood pressure demonstrated a normal response to exercise. There was no ST segment deviation noted during stress. Negative, adequate stress test.  TTE done 07/18/17 EF 60-65% no valve disease normal PA estimate grade 2 diastolic dysfunction  Past Medical History:  Diagnosis Date  . Body mass index 40.0-44.9, adult ([place])   . H/O measles   . H/O mumps   . H/O: whooping cough   . History of chicken pox   . Hypertension   . Persistent atrial fibrillation ([place])   . Sciatica   . Sleep apnea    not using aything at night right now/LH    Past Surgical History:  Procedure Laterality Date  . ABDOMINAL HYSTERECTOMY    . BREAST BIOPSY Right 02/01/2012  . BREAST EXCISIONAL  BIOPSY Bilateral    benign  . CARDIOVERSION N/A 11/17/2015   Procedure: CARDIOVERSION;  Surgeon: [name], MD;  Location: [facility] ENDOSCOPY;  Service: Cardiovascular;  Laterality: N/A;  . CARDIOVERSION N/A 01/07/2016   Procedure: CARDIOVERSION;  Surgeon: [name], MD;  Location: [place];  Service: Cardiovascular;  Laterality: N/A;  . CARDIOVERSION N/A 04/29/2016   Procedure: CARDIOVERSION;  Surgeon: Sanda Klein, MD;  Location: [facility] ENDOSCOPY;  Service: Cardiovascular;  Laterality: N/A;  . DG  BONE DENSITY ([place] HX)    . INNER EAR SURGERY    . NASAL SEPTUM SURGERY      Current Medications: Current Meds  Medication Sig  . acetaminophen (TYLENOL) 650 MG CR tablet Take 650 mg by mouth every 8 (eight) hours as needed for pain (arthritis).  Marland Kitchen apixaban (ELIQUIS) 5 MG TABS tablet Take 5 mg by mouth 2 (two) times daily.  . benazepril (LOTENSIN) 40 MG tablet Take 40 mg by mouth daily.  Marland Kitchen BREO ELLIPTA 200-25 MCG/INH AEPB Inhale 1 puff into the lungs every morning. At the same time each day  . calcium carbonate (CALCIUM 600) 1500 (600 Ca) MG TABS tablet Take 1,500 mg by mouth daily with breakfast.  . calcium carbonate (TUMS - DOSED IN MG ELEMENTAL CALCIUM) 500 MG chewable tablet Chew 1 tablet by  mouth daily as needed for indigestion or heartburn.  . cetirizine (ZYRTEC) 5 MG tablet Take 5 mg by mouth daily.  Marland Kitchen dofetilide (TIKOSYN) 125 MCG capsule TAKE THREE CAPSULES BY MOUTH TWICE DAILY (LEAVE in stock bottles)  . furosemide (LASIX) 40 MG tablet Take 1 tablet every other day x 7 days. Then as needed every other day x 2 doses for swelling.  Marland Kitchen glimepiride (AMARYL) 2 MG tablet Take 2-4 mg by mouth as directed. Patient takes 2 tablets in the morning and 2 tablet at night  . guaiFENesin (MUCINEX) 600 MG 12 hr tablet Take 600 mg by mouth 2 (two) times daily.  Marland Kitchen HYDROcodone-acetaminophen (NORCO/VICODIN) 5-325 MG tablet Take 1 tablet by mouth every 6 (six) hours as needed for moderate pain.  .  L-LYSINE PO TAKE ONE TABLET BY MOUTH DAILY  . linaGLIPtin (TRADJENTA PO) Take 5 mg by mouth every other day.  . magnesium oxide (MAG-OX) 400 MG tablet TAKE 1 TABLET BY MOUTH TWICE A DAY  . metoprolol tartrate (LOPRESSOR) 25 MG tablet Take 25 mg by mouth. 1 tablet in the morning and 1/2 tablet in the evening  . Multiple Vitamins-Minerals (ZINC PO) Take 1 tablet by mouth daily.  . multivitamin-lutein (OCUVITE-LUTEIN) CAPS capsule Take 1 capsule by mouth daily.  Marland Kitchen NEXLETOL 180 MG TABS Take 1 tablet by mouth daily.  . Omega-3 Fatty Acids (FISH OIL) 1000 MG CAPS Take 1,000 mg by mouth 3 (three) times daily.   Marland Kitchen spironolactone (ALDACTONE) 25 MG tablet Take 0.5 tablets (12.5 mg total) by mouth daily.  Marland Kitchen triamcinolone cream (KENALOG) 0.1 % Apply 1 application topically daily as needed. For rash     Allergies:   Penicillins, Other, and Neosporin [neomycin-bacitracin zn-polymyx]   Social History   Socioeconomic History  . Marital status: Married    Spouse name: Not on file  . Number of children: Not on file  . Years of education: Not on file  . Highest education level: Not on file  Occupational History  . Not on file  Tobacco Use  . Smoking status: Never Smoker  . Smokeless tobacco: Never Used  Substance and Sexual Activity  . Alcohol use: No  . Drug use: No  . Sexual activity: Not on file  Other Topics Concern  . 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   Social Determinants of Health   Financial Resource Strain:   . Difficulty of Paying Living Expenses: Not on file  Food Insecurity:   . Worried About Charity fundraiser in the Last Year: Not on file  . Ran Out of Food in the Last Year: Not on file  Transportation Needs:   . Lack of Transportation (Medical): Not on file  . Lack of Transportation (Non-Medical): Not on file  Physical Activity:   . Days of Exercise per Week: Not on file  . Minutes of Exercise per Session: Not on file  Stress:   .  Feeling of Stress : Not on file  Social Connections:   . Frequency of Communication with Friends and Family: Not on file  . Frequency of Social Gatherings with Friends and Family: Not on file  . Attends Religious Services: Not on file  . Active Member of Clubs or Organizations: Not on file  . Attends Archivist Meetings: Not on file  . Marital Status: Not on file     Family History:  The patient's family history includes Breast cancer in  her maternal aunt and sister; Hypertension in her mother.   ROS:   Please see the history of present illness.    Review of Systems  Constitutional: Positive for diaphoresis and malaise/fatigue.  Cardiovascular: Positive for dyspnea on exertion and irregular heartbeat.  Respiratory: Positive for snoring.   Skin: Positive for rash.  Musculoskeletal: Positive for back pain, joint swelling and myalgias.  Neurological: Positive for dizziness and headaches.   All other systems reviewed and are negative.   EKGs/Labs/Other Test Reviewed:    EKG:   07/09/16 SR rate 58 QT 438  Recent Labs: No results found for requested labs within last 8760 hours.   Recent Lipid Panel No results found for: CHOL, TRIG, HDL, CHOLHDL, VLDL, LDLCALC, LDLDIRECT   Physical Exam:    VS:  BP 110/72   Pulse 64   Ht 5\' 8"  (1.727 m)   Wt 277 lb (125.6 kg)   SpO2 97%   BMI 42.12 kg/m     Wt Readings from Last 3 Encounters:  12/25/19 277 lb (125.6 kg)  06/11/19 277 lb (125.6 kg)  05/28/19 273 lb (123.8 kg)    Affect appropriate Overweight white female  HEENT: normal Neck supple with no adenopathy JVP normal no bruits no thyromegaly Lungs clear with no wheezing and good diaphragmatic motion Heart:  S1/S2 no murmur, no rub, gallop or click PMI normal Abdomen: benighn, BS positve, no tenderness, no AAA no bruit.  No HSM or HJR Distal pulses intact with no bruits Plus one bilateral edema Neuro non-focal Skin warm and dry No muscular weakness   PLAN:     In order of problems listed above:  1. PAF:  Maintaining NSR on Tikosyn labs and QT ok f/u Dr [name]   2. HTN - Well controlled.  Continue current medications and low sodium Dash type diet.     3. OSA - F/U with Dr Claiborne Billings regarding issues with CPAP  4. DM:  januvia d/c by primary on amaryl and tradjenta   5. Dyspnea:  Likely from obesity and chronic bronchitis TTE done 07/20/17 EF 60-65% no valve disease Grade 2 diastolic dysfunction Takes aldacone daily and PRN lasix per Dr [name]   6. HLD:  F/u primary she will start taking Nexlitol and f/u with primary Target LDL 100 <  Jenkins Rouge

## 2019-12-19 DIAGNOSIS — G4733 Obstructive sleep apnea (adult) (pediatric): Secondary | ICD-10-CM | POA: Diagnosis not present

## 2019-12-25 ENCOUNTER — Ambulatory Visit: Payer: Medicare Other | Admitting: Cardiovascular Disease

## 2019-12-25 ENCOUNTER — Encounter: Payer: Self-pay | Admitting: Cardiovascular Disease

## 2019-12-25 ENCOUNTER — Other Ambulatory Visit: Payer: Self-pay

## 2019-12-25 VITALS — BP 110/72 | HR 64 | Ht 68.0 in | Wt 277.0 lb

## 2019-12-25 DIAGNOSIS — I480 Paroxysmal atrial fibrillation: Secondary | ICD-10-CM

## 2019-12-25 NOTE — Patient Instructions (Signed)

## 2019-12-27 DIAGNOSIS — B351 Tinea unguium: Secondary | ICD-10-CM | POA: Diagnosis not present

## 2019-12-27 DIAGNOSIS — L84 Corns and callosities: Secondary | ICD-10-CM | POA: Diagnosis not present

## 2019-12-27 DIAGNOSIS — M79676 Pain in unspecified toe(s): Secondary | ICD-10-CM | POA: Diagnosis not present

## 2019-12-27 DIAGNOSIS — E1142 Type 2 diabetes mellitus with diabetic polyneuropathy: Secondary | ICD-10-CM | POA: Diagnosis not present

## 2019-12-31 ENCOUNTER — Other Ambulatory Visit: Payer: Self-pay

## 2019-12-31 ENCOUNTER — Encounter: Payer: Self-pay | Admitting: Internal Medicine

## 2019-12-31 ENCOUNTER — Ambulatory Visit (INDEPENDENT_AMBULATORY_CARE_PROVIDER_SITE_OTHER): Payer: Medicare Other | Admitting: Internal Medicine

## 2019-12-31 VITALS — BP 132/70 | HR 62 | Ht 68.0 in | Wt 278.0 lb

## 2019-12-31 DIAGNOSIS — I4819 Other persistent atrial fibrillation: Secondary | ICD-10-CM

## 2019-12-31 DIAGNOSIS — Z79899 Other long term (current) drug therapy: Secondary | ICD-10-CM

## 2019-12-31 MED ORDER — FUROSEMIDE 40 MG PO TABS: ORAL_TABLET | ORAL | 0 refills | Status: DC

## 2019-12-31 NOTE — Patient Instructions (Signed)
Medication Instructions:  Your physician recommends that you continue on your current medications as directed. Please refer to the Current Medication list given to you today.  *If you need a refill on your cardiac medications before your next appointment, please call your pharmacy*   Lab Work: BMET and MG today  If you have labs (blood work) drawn today and your tests are completely normal, you will receive your results only by: Marland Kitchen MyChart Message (if you have MyChart) OR . A paper copy in the mail If you have any lab test that is abnormal or we need to change your treatment, we will call you to review the results.   Testing/Procedures: None ordered.    Follow-Up: At [facility], you and your health needs are our priority.  As part of our continuing mission to provide you with exceptional heart care, we have created designated Provider Care Teams.  These Care Teams include your primary Cardiologist (physician) and Advanced Practice Providers (APPs -  Physician Assistants and Nurse Practitioners) who all work together to provide you with the care you need, when you need it.  We recommend signing up for the patient portal called "MyChart".  Sign up information is provided on this After Visit Summary.  MyChart is used to connect with patients for Virtual Visits (Telemedicine).  Patients are able to view lab/test results, encounter notes, upcoming appointments, etc.  Non-urgent messages can be sent to your provider as well.   To learn more about what you can do with MyChart, go to [email or web address].    Your next appointment:   6 month(s)  The format for your next appointment:   In Person  Provider:   Virl Axe, MD

## 2019-12-31 NOTE — Progress Notes (Signed)
Patient Care Team: [name], MD as PCP - General (Internal Medicine) [name], MD as PCP - Cardiology (Cardiology)   HPI  Dominique Bauer is a 74 y.o. female Seen in follow-up for atrial fibrillation for which she was admitted for dofetilide initiation 1/18. She was seen in the A. fib clinic 2/18 feeling much better in sinus rhythm.  No interval atrial fibrillation of which she is aware; however, about 2 weeks ago she had an episode of prolonged tachypalpitations of 1-2 hours.  Since that time she has noted more shortness of breath.  She took a dose of furosemide 2 days ago and this improved somewhat.  Trace edema left greater than right which she ascribes to the prior DVT.  She wears support stockings.      DATE TEST EF   11/17 Echo   65 %   4/19 Echo   65 % LAE mild mod          Date Cr K Mg Hgb  2/18  0.72 4.6 1.9   6/18 0.74 4.1 1.6   11/18 0.8 4.6 2.2 13.2  12/19 0.73 4.7 2.1 13.2           Past Medical History:  Diagnosis Date  . Body mass index 40.0-44.9, adult ([place])   . H/O measles   . H/O mumps   . H/O: whooping cough   . History of chicken pox   . Hypertension   . Persistent atrial fibrillation ([place])   . Sciatica   . Sleep apnea    not using aything at night right now/LH    Past Surgical History:  Procedure Laterality Date  . ABDOMINAL HYSTERECTOMY    . BREAST BIOPSY Right 02/01/2012  . BREAST EXCISIONAL BIOPSY Bilateral    benign  . CARDIOVERSION N/A 11/17/2015   Procedure: CARDIOVERSION;  Surgeon: [name], MD;  Location: [facility] ENDOSCOPY;  Service: Cardiovascular;  Laterality: N/A;  . CARDIOVERSION N/A 01/07/2016   Procedure: CARDIOVERSION;  Surgeon: [name], MD;  Location: [facility];  Service: Cardiovascular;  Laterality: N/A;  . CARDIOVERSION N/A 04/29/2016   Procedure: CARDIOVERSION;  Surgeon: Sanda Belvia Gotschall, MD;  Location: [facility] ENDOSCOPY;  Service: Cardiovascular;  Laterality: N/A;  . DG  BONE DENSITY ([place]  HX)    . INNER EAR SURGERY    . NASAL SEPTUM SURGERY      Current Outpatient Medications  Medication Sig Dispense Refill  . acetaminophen (TYLENOL) 650 MG CR tablet Take 650 mg by mouth every 8 (eight) hours as needed for pain (arthritis).    Marland Kitchen apixaban (ELIQUIS) 5 MG TABS tablet Take 5 mg by mouth 2 (two) times daily.    Marland Kitchen BREO ELLIPTA 200-25 MCG/INH AEPB Inhale 1 puff into the lungs every morning. At the same time each day  0  . calcium carbonate (CALCIUM 600) 1500 (600 Ca) MG TABS tablet Take 1,500 mg by mouth daily with breakfast.    . calcium carbonate (TUMS - DOSED IN MG ELEMENTAL CALCIUM) 500 MG chewable tablet Chew 1 tablet by mouth daily as needed for indigestion or heartburn.    . cetirizine (ZYRTEC) 5 MG tablet Take 5 mg by mouth daily.    Marland Kitchen dofetilide (TIKOSYN) 125 MCG capsule TAKE THREE CAPSULES BY MOUTH TWICE DAILY (LEAVE in stock bottles) 540 capsule 2  . furosemide (LASIX) 40 MG tablet Take 1 tablet every other day x 7 days. Then as needed every other day x 2 doses  for swelling. 30 tablet 0  . glimepiride (AMARYL) 2 MG tablet Take 2-4 mg by mouth as directed. Patient takes 2 tablets in the morning and 2 tablet at night    . guaiFENesin (MUCINEX) 600 MG 12 hr tablet Take 600 mg by mouth 2 (two) times daily.    Marland Kitchen HYDROcodone-acetaminophen (NORCO/VICODIN) 5-325 MG tablet Take 1 tablet by mouth every 6 (six) hours as needed for moderate pain.    . L-LYSINE PO TAKE ONE TABLET BY MOUTH DAILY    . linaGLIPtin (TRADJENTA PO) Take 5 mg by mouth daily.     . magnesium oxide (MAG-OX) 400 MG tablet TAKE 1 TABLET BY MOUTH TWICE A DAY 180 tablet 3  . metoprolol tartrate (LOPRESSOR) 25 MG tablet Take 25 mg by mouth. 1 tablet in the morning and 1/2 tablet in the evening    . Multiple Vitamins-Minerals (ZINC PO) Take 1 tablet by mouth daily.    . multivitamin-lutein (OCUVITE-LUTEIN) CAPS capsule Take 1 capsule by mouth daily.    Marland Kitchen NEXLETOL 180 MG TABS Take 1 tablet by mouth daily.    .  Omega-3 Fatty Acids (FISH OIL) 1000 MG CAPS Take 1,000 mg by mouth 3 (three) times daily.     Marland Kitchen spironolactone (ALDACTONE) 25 MG tablet Take 0.5 tablets (12.5 mg total) by mouth daily. 45 tablet 3  . triamcinolone cream (KENALOG) 0.1 % Apply 1 application topically daily as needed. For rash    . benazepril (LOTENSIN) 40 MG tablet Take 40 mg by mouth daily. (Patient not taking: Reported on 12/31/2019)     No current facility-administered medications for this visit.    Allergies  Allergen Reactions  . Penicillins Hives    severe  . Other     Cigarette smoke and certain perfumes - chest tightening and runny nose  . Neosporin [Neomycin-Bacitracin Zn-Polymyx] Rash      Review of Systems negative except from HPI and PMH  Physical Exam   BP 132/70   Pulse 62   Ht 5\' 8"  (1.727 m)   Wt 278 lb (126.1 kg)   BMI 42.27 kg/m  Well developed and nourished in no acute distress HENT normal Neck supple with JVP-  flat 6-8 Clear Regular rate and rhythm, no murmurs or gallops Abd-soft with active BS No Clubbing cyanosis tr edema wearing support stocking Skin-warm and dry A & Oriented  Grossly normal sensory and motor function  ECG sinus @ 62 21/10/44      Assessment and  Plan Afib persistent  OSA  Hypertension  Sinus Bradycardia  DOE  HFpEF-chronic  High Risk Medication Surveillance aldactone/dofetilide     Blood pressures well controlled.  Intermittent aggravations of her HFpEF.  Encouraged her to take furosemide [name] and then again on Friday.  Discussed the likelihood of recurrence of atrial fibrillation not withstanding the great track record over the last 3 years.  Would undertake cardioversion and continue the dofetilide.  Needs dofetilide surveillance labs.  Interval tachypalpitations could be almost anything.  They were not associated with presyncope/syncope so I do not think that they are proarrhythmic.  Probably atrial arrhythmia.  Current medicines are  reviewed at length with the patient today .  The patient does not  have concerns regarding medicines.

## 2020-01-01 LAB — BASIC METABOLIC PANEL
BUN/Creatinine Ratio: 21 (ref 12–28)
BUN: 17 mg/dL (ref 8–27)
CO2: 22 mmol/L (ref 20–29)
Calcium: 10.5 mg/dL — ABNORMAL HIGH (ref 8.7–10.3)
Chloride: 100 mmol/L (ref 96–106)
Creatinine, Ser: 0.82 mg/dL (ref 0.57–1.00)
GFR calc Af Amer: 82 mL/min/{1.73_m2} (ref >59)
GFR calc non Af Amer: 71 mL/min/{1.73_m2} (ref >59)
Glucose: 232 mg/dL — ABNORMAL HIGH (ref 65–99)
Potassium: 4.4 mmol/L (ref 3.5–5.2)
Sodium: 136 mmol/L (ref 134–144)

## 2020-01-01 LAB — MAGNESIUM: Magnesium: 1.8 mg/dL (ref 1.6–2.3)

## 2020-01-07 DIAGNOSIS — E7849 Other hyperlipidemia: Secondary | ICD-10-CM | POA: Diagnosis not present

## 2020-01-07 DIAGNOSIS — E1165 Type 2 diabetes mellitus with hyperglycemia: Secondary | ICD-10-CM | POA: Diagnosis not present

## 2020-01-07 DIAGNOSIS — I10 Essential (primary) hypertension: Secondary | ICD-10-CM | POA: Diagnosis not present

## 2020-02-04 DIAGNOSIS — I10 Essential (primary) hypertension: Secondary | ICD-10-CM | POA: Diagnosis not present

## 2020-02-04 DIAGNOSIS — E7849 Other hyperlipidemia: Secondary | ICD-10-CM | POA: Diagnosis not present

## 2020-02-04 DIAGNOSIS — E1165 Type 2 diabetes mellitus with hyperglycemia: Secondary | ICD-10-CM | POA: Diagnosis not present

## 2020-02-18 DIAGNOSIS — I10 Essential (primary) hypertension: Secondary | ICD-10-CM | POA: Diagnosis not present

## 2020-02-18 DIAGNOSIS — E7849 Other hyperlipidemia: Secondary | ICD-10-CM | POA: Diagnosis not present

## 2020-02-18 DIAGNOSIS — E1165 Type 2 diabetes mellitus with hyperglycemia: Secondary | ICD-10-CM | POA: Diagnosis not present

## 2020-03-03 DIAGNOSIS — E1165 Type 2 diabetes mellitus with hyperglycemia: Secondary | ICD-10-CM | POA: Diagnosis not present

## 2020-03-03 DIAGNOSIS — E7849 Other hyperlipidemia: Secondary | ICD-10-CM | POA: Diagnosis not present

## 2020-03-03 DIAGNOSIS — I10 Essential (primary) hypertension: Secondary | ICD-10-CM | POA: Diagnosis not present

## 2020-03-05 DIAGNOSIS — E119 Type 2 diabetes mellitus without complications: Secondary | ICD-10-CM | POA: Diagnosis not present

## 2020-03-17 DIAGNOSIS — L0390 Cellulitis, unspecified: Secondary | ICD-10-CM | POA: Diagnosis not present

## 2020-03-25 DIAGNOSIS — L0390 Cellulitis, unspecified: Secondary | ICD-10-CM | POA: Diagnosis not present

## 2020-03-27 DIAGNOSIS — M79676 Pain in unspecified toe(s): Secondary | ICD-10-CM | POA: Diagnosis not present

## 2020-03-27 DIAGNOSIS — L84 Corns and callosities: Secondary | ICD-10-CM | POA: Diagnosis not present

## 2020-03-27 DIAGNOSIS — E1142 Type 2 diabetes mellitus with diabetic polyneuropathy: Secondary | ICD-10-CM | POA: Diagnosis not present

## 2020-03-27 DIAGNOSIS — B351 Tinea unguium: Secondary | ICD-10-CM | POA: Diagnosis not present

## 2020-04-26 DIAGNOSIS — E7849 Other hyperlipidemia: Secondary | ICD-10-CM | POA: Diagnosis not present

## 2020-04-26 DIAGNOSIS — I10 Essential (primary) hypertension: Secondary | ICD-10-CM | POA: Diagnosis not present

## 2020-04-26 DIAGNOSIS — E1165 Type 2 diabetes mellitus with hyperglycemia: Secondary | ICD-10-CM | POA: Diagnosis not present

## 2020-04-28 DIAGNOSIS — G4733 Obstructive sleep apnea (adult) (pediatric): Secondary | ICD-10-CM | POA: Diagnosis not present

## 2020-05-15 ENCOUNTER — Encounter: Payer: Self-pay | Admitting: Cardiovascular Disease

## 2020-05-15 ENCOUNTER — Other Ambulatory Visit: Payer: Self-pay

## 2020-05-15 ENCOUNTER — Ambulatory Visit (INDEPENDENT_AMBULATORY_CARE_PROVIDER_SITE_OTHER): Payer: Medicare Other | Admitting: Cardiovascular Disease

## 2020-05-15 VITALS — BP 142/86 | HR 59 | Ht 68.0 in | Wt 274.0 lb

## 2020-05-15 DIAGNOSIS — I480 Paroxysmal atrial fibrillation: Secondary | ICD-10-CM

## 2020-05-15 DIAGNOSIS — I10 Essential (primary) hypertension: Secondary | ICD-10-CM

## 2020-05-15 DIAGNOSIS — G4733 Obstructive sleep apnea (adult) (pediatric): Secondary | ICD-10-CM | POA: Diagnosis not present

## 2020-05-15 DIAGNOSIS — E119 Type 2 diabetes mellitus without complications: Secondary | ICD-10-CM | POA: Diagnosis not present

## 2020-05-15 DIAGNOSIS — Z9989 Dependence on other enabling machines and devices: Secondary | ICD-10-CM | POA: Diagnosis not present

## 2020-05-15 NOTE — Patient Instructions (Signed)

## 2020-05-15 NOTE — Progress Notes (Signed)
Cardiology Office Note    Date:  05/17/2020   ID:  Dominique Bauer, DOB 1945-12-21, MRN [id number]  PCP:  [name], MD  Cardiologist:  Shelva Majestic, MD (sleep), Dr. Johnsie Cancel, Dr.. [name] (EP)  F/U sleep evaluation  History of Present Illness:  Dominique Bauer is a 75 y.o. female who presents for a 15 month follow-up sleep evaluation.  Dominique Bauer is a patient of Dr. Johnsie Cancel and and Dr. [name] who has a history of hypertension, and atrial fibrillation.  She had a long history of persistent atrial fibrillation and had failed previous antiarrhythmic therapy and cardioversions, but recently underwent successful cardioversion with dofetilide.  Her left atrial size is mildly enlarged at 43 mm.  Due to concerns for sleep apnea she was referred for diagnostic sleep study which was done on 02/15/2016.  This revealed severe obstructive sleep apnea with an AHI of 31.2 per hour.  Sleep apnea was more severe during Rehm sleep with an AHI of 58.3 per hour and with supine position at 46.1 per hour.  She had significant oxygen desaturation to a nadir of 69% on her diagnostic study.  She socially underwent a CPAP titration trial on 05/05/2016.  It was initially recommended that she use CPAP auto with a minimum pressure of 10 and maximum pressure of 18 cm water pressure with heated humidification.  She was unable to tolerate the high pressures and ultimately this was reduced to a maximum pressure of 16.  She has been using a fullface mask.  A download was obtained from 06/21/2016 through 07/20/2016.  This demonstrated excellent compliance with 100% usage stays at 97% of days with usage greater than 4 hours.  However, she was only sleeping 4 hours and 30 minutes.  There was no significant leak.  Her 95th percentile pressure was 13.4 with a maximum average pressure of 14.2. Upon further questioning, she states that she typically goes to bed between 11 PM and midnight and often is in bed until 10 AM in the  morning.  When I saw her, she was not using CPAP through the duration of her time in bed.  She felt improved since initiating CPAP.   I saw her in July 2018 and prior to that visit she was having difficulty with her mask with tenderness on the bridge of her nose.  At that time, I also had significant discussion with her and recommended the new Respironics dream were full face mask.  Apparently, she took a prescription to [facility].  At the time they did not have this new mask which had just become available.  Over the past several months, she has significantly increased her sleep duration with CPAP use.  I obtained a new download in the office  from July 1-30, 2018 which demonstrated 100% compliancec with usage stays and uses greater than 4 hours and averaging 6 hours and 57 minutes of CPAP use per night.  She has ResMed AirSense 10 Auto system and has a minimum pressure setting at 10 and maximum of 14.  Her 95th percent.  Average pressure was 13.2.  At times she still having increased leak around her nasal bridge.  Her AHI was 1.1.  She was been unaware of any recurrent atrial fibrillation.    I last saw her in October 2019 at which time continued to use CPAP with 100% compliance.  I obtained a new download from September 22 through January 16, 2018 which confirmed 100% compliance.  Her ResMed  air sense auto set is set at a minimum pressure of 10 with maximum of 14.  95th percentile pressure is 11.6 with a maximum average pressure of 12.1.  AHI remains excellent at 0.5.  She feels well.  She denies any residual daytime sleepiness.  A new Epworth scale was calculated in the office  and this endorsed at 7.  She was unaware of breakthrough snoring.  She denied any  bruxism, restless legs, hypnagogic hallucinations or cataplexy.    Last saw her in November 2020 at which time she continued to do well. She last saw Dr. Alfonse Spruce in September 2020 and was maintaining sinus rhythm on Tikosyn and was without QTC  prolongation.  Her hypertension was controlled and she was on a low-sodium Dash type diet.  Presently, she continues to use CPAP.  A new download was obtained from October 13 through February 07, 2019 which confirms excellent compliance.  However she does have a mask leak.  She has been using a full facemask.  Her CPAP has been set at a minimum pressure of 10 with a maximum pressure of 14.  95 percentile pressure is 11.0 with a maximum average pressure of 11.8.  AHI is excellent at 0.4.  [employer] is her DME company who brought out advance home care.  Of note, for for family members including a daughter, son-in-law, and 2 grandchildren have come down with COVID-19 infection.  She has not had any recent exposure to them.  During her evaluation, due to her significant mask leak I recommended changing her fullface mask to a ResMed air fit F 30i mask.  Since I last saw her, she has continued to feel well.  She admits to excellent compliance with her CPAP therapy and has not had any recurrent episodes of arrhythmia.  I obtained a download from April 14, 2018 through through May 13, 2020.  Appliance is 100% with average use at 8 hours and 23 minutes.  Her CPAP is set at a range of 10 to 14 cm of water and AHI is excellent at 0.8.  95th percentile pressure is 12.0 with maximum average pressure 12.8 cm of water.  Presently she goes to bed around 1 AM and wakes up around 11 AM.  She is sleeping well.  She denies breakthrough snoring.  A new Epworth Sleepiness Scale score was calculated in the office today and this endorsed at 5 arguing against residual daytime sleepiness.  States her blood pressure has been stable and she continues to tolerate Tikosyn without breakthrough atrial arrhythmia.   Past Medical History:  Diagnosis Date  . Body mass index 40.0-44.9, adult ([place])   . H/O measles   . H/O mumps   . H/O: whooping cough   . History of chicken pox   . Hypertension   . Persistent atrial fibrillation ([place])    . Sciatica   . Sleep apnea    not using aything at night right now/LH    Past Surgical History:  Procedure Laterality Date  . ABDOMINAL HYSTERECTOMY    . BREAST BIOPSY Right 02/01/2012  . BREAST EXCISIONAL BIOPSY Bilateral    benign  . CARDIOVERSION N/A 11/17/2015   Procedure: CARDIOVERSION;  Surgeon: [name], MD;  Location: [facility] ENDOSCOPY;  Service: Cardiovascular;  Laterality: N/A;  . CARDIOVERSION N/A 01/07/2016   Procedure: CARDIOVERSION;  Surgeon: [name], MD;  Location: [facility] ENDOSCOPY;  Service: Cardiovascular;  Laterality: N/A;  . CARDIOVERSION N/A 04/29/2016   Procedure: CARDIOVERSION;  Surgeon: Sanda Klein,  MD;  Location: [place];  Service: Cardiovascular;  Laterality: N/A;  . DG  BONE DENSITY ([place] HX)    . INNER EAR SURGERY    . NASAL SEPTUM SURGERY      Current Medications: Outpatient Medications Prior to Visit  Medication Sig Dispense Refill  . acetaminophen (TYLENOL) 650 MG CR tablet Take 650 mg by mouth every 8 (eight) hours as needed for pain (arthritis).    Marland Kitchen apixaban (ELIQUIS) 5 MG TABS tablet Take 5 mg by mouth 2 (two) times daily.    . benazepril (LOTENSIN) 40 MG tablet Take 40 mg by mouth daily. 1 Tablet Daily    . BREO ELLIPTA 200-25 MCG/INH AEPB Inhale 1 puff into the lungs every morning. At the same time each day  0  . calcium carbonate (OSCAL) 1500 (600 Ca) MG TABS tablet Take 1,500 mg by mouth daily with breakfast.    . calcium carbonate (TUMS - DOSED IN MG ELEMENTAL CALCIUM) 500 MG chewable tablet Chew 1 tablet by mouth daily as needed for indigestion or heartburn.    . cetirizine (ZYRTEC) 5 MG tablet Take 5 mg by mouth daily.    Marland Kitchen dofetilide (TIKOSYN) 125 MCG capsule TAKE THREE CAPSULES BY MOUTH TWICE DAILY (LEAVE in stock bottles) 540 capsule 2  . Fluticasone-Salmeterol (ADVAIR) 100-50 MCG/DOSE AEPB SMARTSIG:By Mouth    . furosemide (LASIX) 40 MG tablet As directed 30 tablet 0  . glimepiride (AMARYL) 2 MG tablet Take 2-4 mg by mouth as  directed. Patient takes 2 tablets in the morning and 2 tablet at night    . guaiFENesin (MUCINEX) 600 MG 12 hr tablet Take 600 mg by mouth 2 (two) times daily.    Marland Kitchen HYDROcodone-acetaminophen (NORCO/VICODIN) 5-325 MG tablet Take 1 tablet by mouth every 6 (six) hours as needed for moderate pain.    . L-LYSINE PO TAKE ONE TABLET BY MOUTH DAILY    . linaGLIPtin (TRADJENTA PO) Take 5 mg by mouth daily.     . magnesium oxide (MAG-OX) 400 MG tablet TAKE 1 TABLET BY MOUTH TWICE A DAY 180 tablet 3  . metoprolol tartrate (LOPRESSOR) 25 MG tablet Take 25 mg by mouth. 1 tablet in the morning and 1/2 tablet in the evening    . Multiple Vitamins-Minerals (ZINC PO) Take 1 tablet by mouth daily.    . multivitamin-lutein (OCUVITE-LUTEIN) CAPS capsule Take 1 capsule by mouth daily.    Marland Kitchen NEXLETOL 180 MG TABS Take 1 tablet by mouth daily.    . Omega-3 Fatty Acids (FISH OIL) 1000 MG CAPS Take 1,000 mg by mouth 3 (three) times daily.     Marland Kitchen spironolactone (ALDACTONE) 25 MG tablet Take 0.5 tablets (12.5 mg total) by mouth daily. 45 tablet 3  . triamcinolone cream (KENALOG) 0.1 % Apply 1 application topically daily as needed. For rash    . benazepril (LOTENSIN) 40 MG tablet Take 40 mg by mouth daily.     No facility-administered medications prior to visit.     Allergies:   Penicillins, Other, and Neosporin [neomycin-bacitracin zn-polymyx]   Social History   Socioeconomic History  . Marital status: Married    Spouse name: Not on file  . Number of children: Not on file  . Years of education: Not on file  . Highest education level: Not on file  Occupational History  . Not on file  Tobacco Use  . Smoking status: Never Smoker  . Smokeless tobacco: Never Used  Substance and Sexual Activity  . Alcohol use:  No  . Drug use: No  . Sexual activity: Not on file  Other Topics Concern  . 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   Social Determinants of Health   Financial Resource  Strain: Not on file  Food Insecurity: Not on file  Transportation Needs: Not on file  Physical Activity: Not on file  Stress: Not on file  Social Connections: Not on file     Family History:  The patient's family history includes Breast cancer in her maternal aunt and sister; Hypertension in her mother.   ROS General: Negative; No fevers, chills, or night sweats;  HEENT: Negative; No changes in vision or hearing, sinus congestion, difficulty swallowing Pulmonary: Negative; No cough, wheezing, shortness of breath, hemoptysis Cardiovascular: No recurrent AF GI: Negative; No nausea, vomiting, diarrhea, or abdominal pain GU: Negative; No dysuria, hematuria, or difficulty voiding Musculoskeletal: Negative; no myalgias, joint pain, or weakness Hematologic/Oncology: Negative; no easy bruising, bleeding Endocrine: Positive for diabetes Neuro: Negative; no changes in balance, headaches Skin: Negative; No rashes or skin lesions Psychiatric: Negative; No behavioral problems, depression Sleep:See HPI Other comprehensive 14 point system review is negative.   PHYSICAL EXAM:   VS:  BP (!) 142/86   Pulse (!) 59   Ht 5\' 8"  (1.727 m)   Wt 274 lb (124.3 kg)   SpO2 96%   BMI 41.66 kg/m   Repeat blood pressure by me was 126/82  Wt Readings from Last 3 Encounters:  05/15/20 274 lb (124.3 kg)  12/31/19 278 lb (126.1 kg)  12/25/19 277 lb (125.6 kg)   General: Alert, oriented, no distress.  Morbidly obese  Skin: normal turgor, no rashes, warm and dry HEENT: Normocephalic, atraumatic. Pupils equal round and reactive to light; sclera anicteric; extraocular muscles intact;  Nose without nasal septal hypertrophy Mouth/Parynx benign; Mallinpatti scale 4 Neck: No JVD, no carotid bruits; normal carotid upstroke Lungs: clear to ausculatation and percussion; no wheezing or rales Chest wall: without tenderness to palpitation Heart: PMI not displaced, RRR, s1 s2 normal, 1/6 systolic murmur, no  diastolic murmur, no rubs, gallops, thrills, or heaves Abdomen: soft, nontender; no hepatosplenomehaly, BS+; abdominal aorta nontender and not dilated by palpation. Back: no CVA tenderness Pulses 2+ Musculoskeletal: full range of motion, normal strength, no joint deformities Extremities: no clubbing cyanosis or edema, Homan's sign negative  Neurologic: grossly nonfocal; Cranial nerves grossly wnl Psychologic: Normal mood and affect   Studies/Labs Reviewed:   ECG (independently read by me): Sinus bradycardia at 59; 1st degree AV block, PR interval 210 ms; QTC interval 435 ms.  November 2020 ECG (independently read by me): NSR at 75; First degree AV block PR 216 msec; QS V1-2.      October 2019 ECG (independently read by me): Sinus bradycardia at 56 bpm.  First-degree AV block with a PR interval of 210 ms.  Q waves inferiorly with preserved R waves  July 2018 ECG (independently read by me): Sinus bradycardia 53 bpm.  PR interval 192 ms.  QTc interval 457 ms.  Nonspecific T changes in lead 3.  April 2018 ECG (independently read by me): Sinus bradycardia 56 bpm.  Left axis deviation.  T-wave inversion in lead 3.  QS in V2.  QTc interval 420 ms.  PR interval 196 ms.  Recent Labs: BMP Latest Ref Rng & Units 12/31/2019 02/28/2018 02/22/2017  Glucose 65 - 99 mg/dL 232(H) 129(H) 81  BUN 8 - 27 mg/dL 17 17 19   Creatinine  0.57 - 1.00 mg/dL 0.82 0.73 0.80  BUN/Creat Ratio 12 - 28 21 23 24   Sodium 134 - 144 mmol/L 136 141 140  Potassium 3.5 - 5.2 mmol/L 4.4 4.7 4.6  Chloride 96 - 106 mmol/L 100 100 101  CO2 20 - 29 mmol/L 22 23 23   Calcium 8.7 - 10.3 mg/dL 10.5(H) 10.7(H) 10.8(H)     No flowsheet data found.  CBC Latest Ref Rng & Units 02/28/2018 02/22/2017 01/05/2016  WBC 3.4 - 10.8 x10E3/uL 10.3 10.3 9.3  Hemoglobin 11.1 - 15.9 g/dL 13.5 13.2 13.7  Hematocrit 34.0 - 46.6 % 39.6 37.7 40.8  Platelets 150 - 450 x10E3/uL 275 252 277   Lab Results  Component Value Date   MCV 85 02/28/2018    MCV 85 02/22/2017   MCV 89.7 01/05/2016   No results found for: TSH No results found for: HGBA1C   BNP No results found for: BNP  ProBNP No results found for: PROBNP   Lipid Panel  No results found for: CHOL, TRIG, HDL, CHOLHDL, VLDL, LDLCALC, LDLDIRECT   RADIOLOGY: No results found.   Additional studies/ records that were reviewed today include:  I had previously  read the office notes of Drs. [name] admission, and reviewed her diagnostic polysomnogram as well as CPAP titration trial.  I obtained a download of her CPAP unit in the office today for compliance and efficacy assessment.  In the office today I obtained a new download from July 1 through 10/25/2016.   ASSESSMENT:    1. OSA on CPAP   2. PAF (paroxysmal atrial fibrillation) ([place])   3. Essential hypertension   4. Morbid obesity ([place])   5. Type 2 diabetes mellitus without complication, without long-term current use of insulin [facility])     PLAN:  Ms. Tarea Skillman is a 75 year-old female who has a history of hypertension, and previous persistent atrial fibrillation which proved refractory to additional antiarrhythmics and prior cardioversions, but ultimately she was successfully cardioverted following a tikosyn load.  She was  found to have severe obstructive sleep apnea with an overall AHI of 31.2 per hour and very severe sleep apnea during REM sleep with an AHI of 58.3 per hour.  She had marked oxygen desaturation to a nadir of 69% on her diagnostic study.  When I saw her for her initial sleep evaluation she was meeting compliance but was sleeping an inadequate duration at only 4 hours and 30 minutes.  In prior evaluations I have had a lengthy discussion with her regarding the effects of obstructive sleep apnea on cardiovascular health and particularly with reference to its risk for atrial fibrillation and increased incidence of recurrence of atrial fibrillation if untreated.  We discussed optimal sleep duration.  When I  last saw her, sleep duration had significantly improved.  She was having significant mask leak and I changed her to a ResMed air fit F 30i mask tubing originates from the top of the head and the nasal portion of the fullface mask is under the nose.  Last year, she has continued to do well.  Compliance is now excellent with 100% use and average duration at 8 hours and 23 minutes.  AHI is excellent at 0.8.  Her previous significant leak has significantly improved but at times there is some mild leak.  Blood pressure today on repeat by me was stable elevated on presentation.  She continues to be on benazepril 40 mg, metoprolol 25 mg in the morning and 12.5 mg in the  evening in addition to spironolactone 12.5 mg daily.  She continues to be on Tikosyn 125 mg 3 times a day and is maintaining sinus rhythm.  There is mild first-degree AV block.  QTc interval is normal.  She is maintaining anticoagulation with Eliquis.  She is diabetic on glimepiride.  Her weight is still consistent with morbid obesity.  I again discussed the importance of weight loss and exercise.  She will follow up with Drs. [name] and [place].  I will see her in 1 year for follow-up sleep evaluation.   Medication Adjustments/Labs and Tests Ordered: Current medicines are reviewed at length with the patient today.  Concerns regarding medicines are outlined above.  Medication changes, Labs and Tests ordered today are listed in the Patient Instructions below.  Patient Instructions  Medication Instructions:  The current medical regimen is effective;  continue present plan and medications.  *If you need a refill on your cardiac medications before your next appointment, please call your pharmacy*  Follow-Up: At [facility], you and your health needs are our priority.  As part of our continuing mission to provide you with exceptional heart care, we have created designated Provider Care Teams.  These Care Teams include your primary Cardiologist  (physician) and Advanced Practice Providers (APPs -  Physician Assistants and Nurse Practitioners) who all work together to provide you with the care you need, when you need it.  We recommend signing up for the patient portal called "MyChart".  Sign up information is provided on this After Visit Summary.  MyChart is used to connect with patients for Virtual Visits (Telemedicine).  Patients are able to view lab/test results, encounter notes, upcoming appointments, etc.  Non-urgent messages can be sent to your provider as well.   To learn more about what you can do with MyChart, go to [email or web address].    Your next appointment:   12 month(s)  The format for your next appointment:   In Person  Provider:   Shelva Majestic, MD         Signed, Shelva Majestic, MD  05/17/2020 11:07 AM    [facility] [address] [place], [place], [place]  [address] Phone: [phone]

## 2020-05-17 ENCOUNTER — Encounter: Payer: Self-pay | Admitting: Cardiovascular Disease

## 2020-05-27 DIAGNOSIS — L0390 Cellulitis, unspecified: Secondary | ICD-10-CM | POA: Diagnosis not present

## 2020-05-27 DIAGNOSIS — E7849 Other hyperlipidemia: Secondary | ICD-10-CM | POA: Diagnosis not present

## 2020-05-27 DIAGNOSIS — E1169 Type 2 diabetes mellitus with other specified complication: Secondary | ICD-10-CM | POA: Diagnosis not present

## 2020-05-27 DIAGNOSIS — Z0000 Encounter for general adult medical examination without abnormal findings: Secondary | ICD-10-CM | POA: Diagnosis not present

## 2020-06-25 DIAGNOSIS — M9902 Segmental and somatic dysfunction of thoracic region: Secondary | ICD-10-CM | POA: Diagnosis not present

## 2020-06-25 DIAGNOSIS — M9904 Segmental and somatic dysfunction of sacral region: Secondary | ICD-10-CM | POA: Diagnosis not present

## 2020-06-25 DIAGNOSIS — M9903 Segmental and somatic dysfunction of lumbar region: Secondary | ICD-10-CM | POA: Diagnosis not present

## 2020-06-25 DIAGNOSIS — M5137 Other intervertebral disc degeneration, lumbosacral region: Secondary | ICD-10-CM | POA: Diagnosis not present

## 2020-06-25 DIAGNOSIS — E7849 Other hyperlipidemia: Secondary | ICD-10-CM | POA: Diagnosis not present

## 2020-06-25 DIAGNOSIS — E1169 Type 2 diabetes mellitus with other specified complication: Secondary | ICD-10-CM | POA: Diagnosis not present

## 2020-06-26 DIAGNOSIS — M9903 Segmental and somatic dysfunction of lumbar region: Secondary | ICD-10-CM | POA: Diagnosis not present

## 2020-06-26 DIAGNOSIS — L84 Corns and callosities: Secondary | ICD-10-CM | POA: Diagnosis not present

## 2020-06-26 DIAGNOSIS — B351 Tinea unguium: Secondary | ICD-10-CM | POA: Diagnosis not present

## 2020-06-26 DIAGNOSIS — M79676 Pain in unspecified toe(s): Secondary | ICD-10-CM | POA: Diagnosis not present

## 2020-06-26 DIAGNOSIS — M9902 Segmental and somatic dysfunction of thoracic region: Secondary | ICD-10-CM | POA: Diagnosis not present

## 2020-06-26 DIAGNOSIS — M5137 Other intervertebral disc degeneration, lumbosacral region: Secondary | ICD-10-CM | POA: Diagnosis not present

## 2020-06-26 DIAGNOSIS — M9904 Segmental and somatic dysfunction of sacral region: Secondary | ICD-10-CM | POA: Diagnosis not present

## 2020-06-26 DIAGNOSIS — E1142 Type 2 diabetes mellitus with diabetic polyneuropathy: Secondary | ICD-10-CM | POA: Diagnosis not present

## 2020-06-26 NOTE — Progress Notes (Signed)
Cardiology Office Note:    Date:  06/26/2020   ID:  Dominique Bauer, DOB [birth year], MRN [id number]  PCP:  [name], MD  Cardiologist:  Dr. Jenkins Rouge   Electrophysiologist:  n/a  Referring MD: [name], MD   No chief complaint on file.   History of Present Illness:    75 y.o. with PAF. History of HTN and OSA. Failed DCC alone and on flecainide 01/07/16. Subsequently begun on Tikosyn in hospital with successful [facility] 04/29/16.  Seen by Dr [name] 02/22/17 and beta blocker stopped due to relative bradycardia Last TTE done April 2019 No valve disease normal EF mild to moderate LAE   CHADS2-VASc=3 (female, 75 yo, HTN).     Uses CPAP for OSA.  Sees Dede Query d/c 2019  due to frequent infections on it  She has some LE edema. Dr [name] had her take lasix for 7 days in March but then PRN Avoid low K on Tikosyn. She takes aldactone daily   Has had COVID vaccine  Primary put her on Nexlitol in July 2021 Myalgias with statins LDL 73 A1c 8 Cr .9 K 4.5   No cardiac complaints Discussed needing better control of her BS  Prior CV studies that were reviewed today include:    ETT 12/04/15 Blood pressure demonstrated a normal response to exercise. There was no ST segment deviation noted during stress. Negative, adequate stress test.  TTE done 07/18/17 EF 60-65% no valve disease normal PA estimate grade 2 diastolic dysfunction  Past Medical History:  Diagnosis Date  . Body mass index 40.0-44.9, adult ([place])   . H/O measles   . H/O mumps   . H/O: whooping cough   . History of chicken pox   . Hypertension   . Persistent atrial fibrillation ([place])   . Sciatica   . Sleep apnea    not using aything at night right now/LH    Past Surgical History:  Procedure Laterality Date  . ABDOMINAL HYSTERECTOMY    . BREAST BIOPSY Right 02/01/2012  . BREAST EXCISIONAL BIOPSY Bilateral    benign  . CARDIOVERSION N/A 11/17/2015   Procedure: CARDIOVERSION;  Surgeon: [name],  MD;  [facility] ENDOSCOPY;  Service: Cardiovascular;  Laterality: N/A;  . CARDIOVERSION N/A 01/07/2016   Procedure: CARDIOVERSION;  Surgeon: [name], MD;  Location: [place];  Service: Cardiovascular;  Laterality: N/A;  . CARDIOVERSION N/A 04/29/2016   Procedure: CARDIOVERSION;  Surgeon: Sanda Klein, MD;  Location: [facility] ENDOSCOPY;  Service: Cardiovascular;  Laterality: N/A;  . DG  BONE DENSITY ([place] HX)    . INNER EAR SURGERY    . NASAL SEPTUM SURGERY      Current Medications: No outpatient medications have been marked as taking for the 07/04/20 encounter (Appointment) with [name], MD.     Allergies:   Penicillins, Other, and Neosporin [neomycin-bacitracin zn-polymyx]   Social History   Socioeconomic History  . Marital status: Married    Spouse name: Not on file  . Number of children: Not on file  . Years of education: Not on file  . Highest education level: Not on file  Occupational History  . Not on file  Tobacco Use  . Smoking status: Never Smoker  . Smokeless tobacco: Never Used  Substance and Sexual Activity  . Alcohol use: No  . Drug use: No  . Sexual activity: Not on file  Other Topics Concern  . 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   Social Determinants of Health   Financial Resource Strain: Not on file  Food Insecurity: Not on file  Transportation Needs: Not on file  Physical Activity: Not on file  Stress: Not on file  Social Connections: Not on file     Family History:  The patient's family history includes Breast cancer in her maternal aunt and sister; Hypertension in her mother.   ROS:   Please see the history of present illness.    Review of Systems  Constitutional: Positive for diaphoresis and malaise/fatigue.  Cardiovascular: Positive for dyspnea on exertion and irregular heartbeat.  Respiratory: Positive for snoring.   Skin: Positive for rash.  Musculoskeletal: Positive for back pain, joint  swelling and myalgias.  Neurological: Positive for dizziness and headaches.   All other systems reviewed and are negative.   EKGs/Labs/Other Test Reviewed:    EKG:   05/15/20 SB rate 59 first degree QT 440    Recent Labs: 12/31/2019: BUN 17; Creatinine, Ser 0.82; Magnesium 1.8; Potassium 4.4; Sodium 136   Recent Lipid Panel No results found for: CHOL, TRIG, HDL, CHOLHDL, VLDL, LDLCALC, LDLDIRECT   Physical Exam:    VS:  There were no vitals taken for this visit.    Wt Readings from Last 3 Encounters:  05/15/20 124.3 kg  12/31/19 126.1 kg  12/25/19 125.6 kg    Affect appropriate Overweight white female  HEENT: normal Neck supple with no adenopathy JVP normal no bruits no thyromegaly Lungs clear with no wheezing and good diaphragmatic motion Heart:  S1/S2 no murmur, no rub, gallop or click PMI normal Abdomen: benighn, BS positve, no tenderness, no AAA no bruit.  No HSM or HJR Distal pulses intact with no bruits Plus one bilateral edema Neuro non-focal Skin warm and dry No muscular weakness   PLAN:    In order of problems listed above:  1. PAF:  Maintaining NSR on Tikosyn labs and QT ok f/u Dr [name]   2. HTN - Well controlled.  Continue current medications and low sodium Dash type diet.     3. OSA - F/U with Dr Claiborne Billings regarding issues with CPAP  4. DM:  januvia d/c by primary on amaryl and tradjenta   5. Dyspnea:  Likely from obesity and chronic bronchitis TTE done 07/20/17 EF 60-65% no valve disease Grade 2 diastolic dysfunction Takes aldacone daily and PRN lasix per Dr [name] will update echo   6. HLD:  On Nexlitol as of 10/09/19 LDL at goal 73  F/U in 6 months   Jenkins Rouge

## 2020-06-30 DIAGNOSIS — M5137 Other intervertebral disc degeneration, lumbosacral region: Secondary | ICD-10-CM | POA: Diagnosis not present

## 2020-06-30 DIAGNOSIS — M9904 Segmental and somatic dysfunction of sacral region: Secondary | ICD-10-CM | POA: Diagnosis not present

## 2020-06-30 DIAGNOSIS — M9903 Segmental and somatic dysfunction of lumbar region: Secondary | ICD-10-CM | POA: Diagnosis not present

## 2020-06-30 DIAGNOSIS — M9902 Segmental and somatic dysfunction of thoracic region: Secondary | ICD-10-CM | POA: Diagnosis not present

## 2020-07-04 ENCOUNTER — Other Ambulatory Visit: Payer: Self-pay

## 2020-07-04 ENCOUNTER — Encounter: Payer: Self-pay | Admitting: Cardiovascular Disease

## 2020-07-04 ENCOUNTER — Ambulatory Visit: Payer: Medicare Other | Admitting: Cardiovascular Disease

## 2020-07-04 VITALS — BP 124/72 | HR 67 | Ht 67.0 in | Wt 273.0 lb

## 2020-07-04 DIAGNOSIS — R0600 Dyspnea, unspecified: Secondary | ICD-10-CM | POA: Diagnosis not present

## 2020-07-04 DIAGNOSIS — I480 Paroxysmal atrial fibrillation: Secondary | ICD-10-CM

## 2020-07-04 NOTE — Patient Instructions (Addendum)
Medication Instructions:  *If you need a refill on your cardiac medications before your next appointment, please call your pharmacy*  Lab Work: If you have labs (blood work) drawn today and your tests are completely normal, you will receive your results only by: Marland Kitchen MyChart Message (if you have MyChart) OR . A paper copy in the mail If you have any lab test that is abnormal or we need to change your treatment, we will call you to review the results.  Testing/Procedures: Your physician has requested that you have an echocardiogram. Echocardiography is a painless test that uses sound waves to create images of your heart. It provides your doctor with information about the size and shape of your heart and how well your heart's chambers and valves are working. This procedure takes approximately one hour. There are no restrictions for this procedure.  Follow-Up: At [facility], you and your health needs are our priority.  As part of our continuing mission to provide you with exceptional heart care, we have created designated Provider Care Teams.  These Care Teams include your primary Cardiologist (physician) and Advanced Practice Providers (APPs -  Physician Assistants and Nurse Practitioners) who all work together to provide you with the care you need, when you need it.  We recommend signing up for the patient portal called "MyChart".  Sign up information is provided on this After Visit Summary.  MyChart is used to connect with patients for Virtual Visits (Telemedicine).  Patients are able to view lab/test results, encounter notes, upcoming appointments, etc.  Non-urgent messages can be sent to your provider as well.   To learn more about what you can do with MyChart, go to [email or web address].    Your next appointment:   6 month(s)  The format for your next appointment:   In Person  Provider:   You may see Jenkins Rouge, MD or one of the following Advanced Practice Providers on your  designated Care Team:    Kathyrn Drown, NP

## 2020-07-07 DIAGNOSIS — M5137 Other intervertebral disc degeneration, lumbosacral region: Secondary | ICD-10-CM | POA: Diagnosis not present

## 2020-07-07 DIAGNOSIS — M9902 Segmental and somatic dysfunction of thoracic region: Secondary | ICD-10-CM | POA: Diagnosis not present

## 2020-07-07 DIAGNOSIS — M9903 Segmental and somatic dysfunction of lumbar region: Secondary | ICD-10-CM | POA: Diagnosis not present

## 2020-07-07 DIAGNOSIS — M9904 Segmental and somatic dysfunction of sacral region: Secondary | ICD-10-CM | POA: Diagnosis not present

## 2020-07-09 DIAGNOSIS — M9904 Segmental and somatic dysfunction of sacral region: Secondary | ICD-10-CM | POA: Diagnosis not present

## 2020-07-09 DIAGNOSIS — M5137 Other intervertebral disc degeneration, lumbosacral region: Secondary | ICD-10-CM | POA: Diagnosis not present

## 2020-07-09 DIAGNOSIS — M9903 Segmental and somatic dysfunction of lumbar region: Secondary | ICD-10-CM | POA: Diagnosis not present

## 2020-07-09 DIAGNOSIS — M9902 Segmental and somatic dysfunction of thoracic region: Secondary | ICD-10-CM | POA: Diagnosis not present

## 2020-07-10 ENCOUNTER — Other Ambulatory Visit: Payer: Self-pay | Admitting: Internal Medicine

## 2020-07-14 DIAGNOSIS — M9903 Segmental and somatic dysfunction of lumbar region: Secondary | ICD-10-CM | POA: Diagnosis not present

## 2020-07-14 DIAGNOSIS — M5137 Other intervertebral disc degeneration, lumbosacral region: Secondary | ICD-10-CM | POA: Diagnosis not present

## 2020-07-14 DIAGNOSIS — M9902 Segmental and somatic dysfunction of thoracic region: Secondary | ICD-10-CM | POA: Diagnosis not present

## 2020-07-14 DIAGNOSIS — M9904 Segmental and somatic dysfunction of sacral region: Secondary | ICD-10-CM | POA: Diagnosis not present

## 2020-07-17 DIAGNOSIS — M9904 Segmental and somatic dysfunction of sacral region: Secondary | ICD-10-CM | POA: Diagnosis not present

## 2020-07-17 DIAGNOSIS — M9902 Segmental and somatic dysfunction of thoracic region: Secondary | ICD-10-CM | POA: Diagnosis not present

## 2020-07-17 DIAGNOSIS — M9903 Segmental and somatic dysfunction of lumbar region: Secondary | ICD-10-CM | POA: Diagnosis not present

## 2020-07-17 DIAGNOSIS — M5137 Other intervertebral disc degeneration, lumbosacral region: Secondary | ICD-10-CM | POA: Diagnosis not present

## 2020-07-21 DIAGNOSIS — M5137 Other intervertebral disc degeneration, lumbosacral region: Secondary | ICD-10-CM | POA: Diagnosis not present

## 2020-07-21 DIAGNOSIS — M9903 Segmental and somatic dysfunction of lumbar region: Secondary | ICD-10-CM | POA: Diagnosis not present

## 2020-07-21 DIAGNOSIS — M9902 Segmental and somatic dysfunction of thoracic region: Secondary | ICD-10-CM | POA: Diagnosis not present

## 2020-07-21 DIAGNOSIS — M9904 Segmental and somatic dysfunction of sacral region: Secondary | ICD-10-CM | POA: Diagnosis not present

## 2020-07-23 DIAGNOSIS — M9903 Segmental and somatic dysfunction of lumbar region: Secondary | ICD-10-CM | POA: Diagnosis not present

## 2020-07-23 DIAGNOSIS — M9902 Segmental and somatic dysfunction of thoracic region: Secondary | ICD-10-CM | POA: Diagnosis not present

## 2020-07-23 DIAGNOSIS — M5137 Other intervertebral disc degeneration, lumbosacral region: Secondary | ICD-10-CM | POA: Diagnosis not present

## 2020-07-23 DIAGNOSIS — M9904 Segmental and somatic dysfunction of sacral region: Secondary | ICD-10-CM | POA: Diagnosis not present

## 2020-07-24 DIAGNOSIS — M9902 Segmental and somatic dysfunction of thoracic region: Secondary | ICD-10-CM | POA: Diagnosis not present

## 2020-07-24 DIAGNOSIS — M9904 Segmental and somatic dysfunction of sacral region: Secondary | ICD-10-CM | POA: Diagnosis not present

## 2020-07-24 DIAGNOSIS — M9903 Segmental and somatic dysfunction of lumbar region: Secondary | ICD-10-CM | POA: Diagnosis not present

## 2020-07-24 DIAGNOSIS — M5137 Other intervertebral disc degeneration, lumbosacral region: Secondary | ICD-10-CM | POA: Diagnosis not present

## 2020-07-26 DIAGNOSIS — E7849 Other hyperlipidemia: Secondary | ICD-10-CM | POA: Diagnosis not present

## 2020-07-26 DIAGNOSIS — E1169 Type 2 diabetes mellitus with other specified complication: Secondary | ICD-10-CM | POA: Diagnosis not present

## 2020-07-28 DIAGNOSIS — M9904 Segmental and somatic dysfunction of sacral region: Secondary | ICD-10-CM | POA: Diagnosis not present

## 2020-07-28 DIAGNOSIS — M5137 Other intervertebral disc degeneration, lumbosacral region: Secondary | ICD-10-CM | POA: Diagnosis not present

## 2020-07-28 DIAGNOSIS — M9902 Segmental and somatic dysfunction of thoracic region: Secondary | ICD-10-CM | POA: Diagnosis not present

## 2020-07-28 DIAGNOSIS — M9903 Segmental and somatic dysfunction of lumbar region: Secondary | ICD-10-CM | POA: Diagnosis not present

## 2020-07-29 DIAGNOSIS — G4733 Obstructive sleep apnea (adult) (pediatric): Secondary | ICD-10-CM | POA: Diagnosis not present

## 2020-08-04 DIAGNOSIS — M9904 Segmental and somatic dysfunction of sacral region: Secondary | ICD-10-CM | POA: Diagnosis not present

## 2020-08-04 DIAGNOSIS — M5137 Other intervertebral disc degeneration, lumbosacral region: Secondary | ICD-10-CM | POA: Diagnosis not present

## 2020-08-04 DIAGNOSIS — M9903 Segmental and somatic dysfunction of lumbar region: Secondary | ICD-10-CM | POA: Diagnosis not present

## 2020-08-04 DIAGNOSIS — M9902 Segmental and somatic dysfunction of thoracic region: Secondary | ICD-10-CM | POA: Diagnosis not present

## 2020-08-06 DIAGNOSIS — M9904 Segmental and somatic dysfunction of sacral region: Secondary | ICD-10-CM | POA: Diagnosis not present

## 2020-08-06 DIAGNOSIS — M9903 Segmental and somatic dysfunction of lumbar region: Secondary | ICD-10-CM | POA: Diagnosis not present

## 2020-08-06 DIAGNOSIS — M5137 Other intervertebral disc degeneration, lumbosacral region: Secondary | ICD-10-CM | POA: Diagnosis not present

## 2020-08-06 DIAGNOSIS — M9902 Segmental and somatic dysfunction of thoracic region: Secondary | ICD-10-CM | POA: Diagnosis not present

## 2020-08-11 DIAGNOSIS — M9903 Segmental and somatic dysfunction of lumbar region: Secondary | ICD-10-CM | POA: Diagnosis not present

## 2020-08-11 DIAGNOSIS — M9902 Segmental and somatic dysfunction of thoracic region: Secondary | ICD-10-CM | POA: Diagnosis not present

## 2020-08-11 DIAGNOSIS — M5137 Other intervertebral disc degeneration, lumbosacral region: Secondary | ICD-10-CM | POA: Diagnosis not present

## 2020-08-11 DIAGNOSIS — M9904 Segmental and somatic dysfunction of sacral region: Secondary | ICD-10-CM | POA: Diagnosis not present

## 2020-08-14 ENCOUNTER — Other Ambulatory Visit: Payer: Self-pay

## 2020-08-14 ENCOUNTER — Ambulatory Visit (HOSPITAL_COMMUNITY): Payer: Medicare Other | Attending: Cardiology

## 2020-08-14 DIAGNOSIS — I480 Paroxysmal atrial fibrillation: Secondary | ICD-10-CM | POA: Insufficient documentation

## 2020-08-14 DIAGNOSIS — R0600 Dyspnea, unspecified: Secondary | ICD-10-CM | POA: Diagnosis not present

## 2020-08-14 LAB — ECHOCARDIOGRAM COMPLETE
Area-P 1/2: 3.4 cm2
S' Lateral: 3.8 cm

## 2020-08-14 MED ORDER — PERFLUTREN LIPID MICROSPHERE
1.0000 mL | INTRAVENOUS | Status: AC | PRN
  Administered 2020-08-14: 2 mL via INTRAVENOUS

## 2020-08-18 DIAGNOSIS — M9903 Segmental and somatic dysfunction of lumbar region: Secondary | ICD-10-CM | POA: Diagnosis not present

## 2020-08-18 DIAGNOSIS — M9904 Segmental and somatic dysfunction of sacral region: Secondary | ICD-10-CM | POA: Diagnosis not present

## 2020-08-18 DIAGNOSIS — M9902 Segmental and somatic dysfunction of thoracic region: Secondary | ICD-10-CM | POA: Diagnosis not present

## 2020-08-18 DIAGNOSIS — M5137 Other intervertebral disc degeneration, lumbosacral region: Secondary | ICD-10-CM | POA: Diagnosis not present

## 2020-08-21 DIAGNOSIS — M9902 Segmental and somatic dysfunction of thoracic region: Secondary | ICD-10-CM | POA: Diagnosis not present

## 2020-08-21 DIAGNOSIS — M9904 Segmental and somatic dysfunction of sacral region: Secondary | ICD-10-CM | POA: Diagnosis not present

## 2020-08-21 DIAGNOSIS — M5137 Other intervertebral disc degeneration, lumbosacral region: Secondary | ICD-10-CM | POA: Diagnosis not present

## 2020-08-21 DIAGNOSIS — M9903 Segmental and somatic dysfunction of lumbar region: Secondary | ICD-10-CM | POA: Diagnosis not present

## 2020-08-25 DIAGNOSIS — E1169 Type 2 diabetes mellitus with other specified complication: Secondary | ICD-10-CM | POA: Diagnosis not present

## 2020-08-25 DIAGNOSIS — I10 Essential (primary) hypertension: Secondary | ICD-10-CM | POA: Diagnosis not present

## 2020-08-25 DIAGNOSIS — E7849 Other hyperlipidemia: Secondary | ICD-10-CM | POA: Diagnosis not present

## 2020-08-27 DIAGNOSIS — E1169 Type 2 diabetes mellitus with other specified complication: Secondary | ICD-10-CM | POA: Diagnosis not present

## 2020-08-27 DIAGNOSIS — I10 Essential (primary) hypertension: Secondary | ICD-10-CM | POA: Diagnosis not present

## 2020-08-27 DIAGNOSIS — E7849 Other hyperlipidemia: Secondary | ICD-10-CM | POA: Diagnosis not present

## 2020-08-27 DIAGNOSIS — Z0000 Encounter for general adult medical examination without abnormal findings: Secondary | ICD-10-CM | POA: Diagnosis not present

## 2020-08-28 DIAGNOSIS — M9902 Segmental and somatic dysfunction of thoracic region: Secondary | ICD-10-CM | POA: Diagnosis not present

## 2020-08-28 DIAGNOSIS — M5137 Other intervertebral disc degeneration, lumbosacral region: Secondary | ICD-10-CM | POA: Diagnosis not present

## 2020-08-28 DIAGNOSIS — M9904 Segmental and somatic dysfunction of sacral region: Secondary | ICD-10-CM | POA: Diagnosis not present

## 2020-08-28 DIAGNOSIS — M9903 Segmental and somatic dysfunction of lumbar region: Secondary | ICD-10-CM | POA: Diagnosis not present

## 2020-09-01 DIAGNOSIS — M9903 Segmental and somatic dysfunction of lumbar region: Secondary | ICD-10-CM | POA: Diagnosis not present

## 2020-09-01 DIAGNOSIS — M9902 Segmental and somatic dysfunction of thoracic region: Secondary | ICD-10-CM | POA: Diagnosis not present

## 2020-09-01 DIAGNOSIS — M9904 Segmental and somatic dysfunction of sacral region: Secondary | ICD-10-CM | POA: Diagnosis not present

## 2020-09-01 DIAGNOSIS — M5137 Other intervertebral disc degeneration, lumbosacral region: Secondary | ICD-10-CM | POA: Diagnosis not present

## 2020-09-08 DIAGNOSIS — M9903 Segmental and somatic dysfunction of lumbar region: Secondary | ICD-10-CM | POA: Diagnosis not present

## 2020-09-08 DIAGNOSIS — M9904 Segmental and somatic dysfunction of sacral region: Secondary | ICD-10-CM | POA: Diagnosis not present

## 2020-09-08 DIAGNOSIS — M5137 Other intervertebral disc degeneration, lumbosacral region: Secondary | ICD-10-CM | POA: Diagnosis not present

## 2020-09-08 DIAGNOSIS — M9902 Segmental and somatic dysfunction of thoracic region: Secondary | ICD-10-CM | POA: Diagnosis not present

## 2020-09-15 DIAGNOSIS — M9903 Segmental and somatic dysfunction of lumbar region: Secondary | ICD-10-CM | POA: Diagnosis not present

## 2020-09-15 DIAGNOSIS — M9902 Segmental and somatic dysfunction of thoracic region: Secondary | ICD-10-CM | POA: Diagnosis not present

## 2020-09-15 DIAGNOSIS — M5137 Other intervertebral disc degeneration, lumbosacral region: Secondary | ICD-10-CM | POA: Diagnosis not present

## 2020-09-15 DIAGNOSIS — M9904 Segmental and somatic dysfunction of sacral region: Secondary | ICD-10-CM | POA: Diagnosis not present

## 2020-09-25 DIAGNOSIS — M9902 Segmental and somatic dysfunction of thoracic region: Secondary | ICD-10-CM | POA: Diagnosis not present

## 2020-09-25 DIAGNOSIS — E7849 Other hyperlipidemia: Secondary | ICD-10-CM | POA: Diagnosis not present

## 2020-09-25 DIAGNOSIS — E1169 Type 2 diabetes mellitus with other specified complication: Secondary | ICD-10-CM | POA: Diagnosis not present

## 2020-09-25 DIAGNOSIS — M9903 Segmental and somatic dysfunction of lumbar region: Secondary | ICD-10-CM | POA: Diagnosis not present

## 2020-09-25 DIAGNOSIS — M5137 Other intervertebral disc degeneration, lumbosacral region: Secondary | ICD-10-CM | POA: Diagnosis not present

## 2020-09-25 DIAGNOSIS — M9904 Segmental and somatic dysfunction of sacral region: Secondary | ICD-10-CM | POA: Diagnosis not present

## 2020-09-25 DIAGNOSIS — I10 Essential (primary) hypertension: Secondary | ICD-10-CM | POA: Diagnosis not present

## 2020-09-26 ENCOUNTER — Ambulatory Visit: Payer: Medicare Other | Admitting: Internal Medicine

## 2020-10-06 DIAGNOSIS — M9902 Segmental and somatic dysfunction of thoracic region: Secondary | ICD-10-CM | POA: Diagnosis not present

## 2020-10-06 DIAGNOSIS — M5137 Other intervertebral disc degeneration, lumbosacral region: Secondary | ICD-10-CM | POA: Diagnosis not present

## 2020-10-06 DIAGNOSIS — M9903 Segmental and somatic dysfunction of lumbar region: Secondary | ICD-10-CM | POA: Diagnosis not present

## 2020-10-06 DIAGNOSIS — M9904 Segmental and somatic dysfunction of sacral region: Secondary | ICD-10-CM | POA: Diagnosis not present

## 2020-10-07 DIAGNOSIS — B351 Tinea unguium: Secondary | ICD-10-CM | POA: Diagnosis not present

## 2020-10-07 DIAGNOSIS — L84 Corns and callosities: Secondary | ICD-10-CM | POA: Diagnosis not present

## 2020-10-07 DIAGNOSIS — E1142 Type 2 diabetes mellitus with diabetic polyneuropathy: Secondary | ICD-10-CM | POA: Diagnosis not present

## 2020-10-07 DIAGNOSIS — M79676 Pain in unspecified toe(s): Secondary | ICD-10-CM | POA: Diagnosis not present

## 2020-10-26 DIAGNOSIS — I10 Essential (primary) hypertension: Secondary | ICD-10-CM | POA: Diagnosis not present

## 2020-10-26 DIAGNOSIS — E7849 Other hyperlipidemia: Secondary | ICD-10-CM | POA: Diagnosis not present

## 2020-10-26 DIAGNOSIS — E1169 Type 2 diabetes mellitus with other specified complication: Secondary | ICD-10-CM | POA: Diagnosis not present

## 2020-10-29 DIAGNOSIS — G4733 Obstructive sleep apnea (adult) (pediatric): Secondary | ICD-10-CM | POA: Diagnosis not present

## 2020-10-30 DIAGNOSIS — M9904 Segmental and somatic dysfunction of sacral region: Secondary | ICD-10-CM | POA: Diagnosis not present

## 2020-10-30 DIAGNOSIS — M9902 Segmental and somatic dysfunction of thoracic region: Secondary | ICD-10-CM | POA: Diagnosis not present

## 2020-10-30 DIAGNOSIS — M5137 Other intervertebral disc degeneration, lumbosacral region: Secondary | ICD-10-CM | POA: Diagnosis not present

## 2020-10-30 DIAGNOSIS — M9903 Segmental and somatic dysfunction of lumbar region: Secondary | ICD-10-CM | POA: Diagnosis not present

## 2020-10-30 DIAGNOSIS — Z9189 Other specified personal risk factors, not elsewhere classified: Secondary | ICD-10-CM

## 2020-10-30 NOTE — Progress Notes (Signed)
Dominique Bauer)                                            Dominique Bauer                                        Statin Quality Measure Assessment    10/30/2020  Dominique Bauer May 31, 1945 [id number]  Dr. [name],   I am a [facility] clinical pharmacist that reviews patients for statin quality initiatives.     Per review of chart and payor information, patient has a diagnosis of diabetes but is not currently filling a statin prescription.  This places patient into the SUPD (Statin Use In Patients with Diabetes) measure for CMS.    Patient has documented trials of statins with reported myalgas, but no corresponding CPT codes that would exclude patient from SUPD measure.  The ASCVD Risk score Dominique Bauer., et al., 2013) failed to calculate for the following reasons:   Cannot find a previous HDL lab   Cannot find a previous total cholesterol lab No results found for requested labs within last 26280 hours.  No results found for: CHOL, TRIG, HDL, CHOLHDL, VLDL, LDLCALC, LDLDIRECT  Please consider ONE of the following recommendations:  Initiate high intensity statin Atorvastatin '40mg'$  once daily, #90, 3 refills   Rosuvastatin '20mg'$  once daily, #90, 3 refills    Initiate moderate intensity          statin with reduced frequency if prior          statin intolerance 1x weekly, #13, 3 refills   2x weekly, #26, 3 refills   3x weekly, #39, 3 refills    Code for past statin intolerance or  other exclusions (required annually)  Provider Requirements: Associate code during an office visit or telehealth encounter  Drug Induced Myopathy G72.0   Myopathy, unspecified G72.9   Myositis, unspecified M60.9   Rhabdomyolysis XX123456   Alcoholic fatty liver 99991111   Cirrhosis of liver K74.69   Prediabetes R73.03   PCOS E28.2   Toxic liver disease, unspecified K71.9   Adverse effect of antihyperlipidemic and antiarteriosclerotic drugs,  initial encounter T46.6X5A    Please let us know your decision.    Thank you!  Dominique Bauer, PharmD Clinical Pharmacist  [place] [phone]

## 2020-11-03 ENCOUNTER — Other Ambulatory Visit: Payer: Self-pay | Admitting: Internal Medicine

## 2020-11-03 ENCOUNTER — Ambulatory Visit: Payer: Medicare Other | Admitting: Internal Medicine

## 2020-11-03 DIAGNOSIS — I4819 Other persistent atrial fibrillation: Secondary | ICD-10-CM

## 2020-11-03 DIAGNOSIS — Z1231 Encounter for screening mammogram for malignant neoplasm of breast: Secondary | ICD-10-CM

## 2020-11-07 ENCOUNTER — Other Ambulatory Visit: Payer: Self-pay

## 2020-11-07 ENCOUNTER — Encounter: Payer: Self-pay | Admitting: Internal Medicine

## 2020-11-07 ENCOUNTER — Ambulatory Visit: Payer: Medicare Other | Admitting: Internal Medicine

## 2020-11-07 VITALS — BP 176/77 | HR 59 | Ht 67.0 in | Wt 271.0 lb

## 2020-11-07 DIAGNOSIS — I10 Essential (primary) hypertension: Secondary | ICD-10-CM

## 2020-11-07 DIAGNOSIS — I480 Paroxysmal atrial fibrillation: Secondary | ICD-10-CM

## 2020-11-07 MED ORDER — AMLODIPINE BESYLATE 2.5 MG PO TABS
2.5000 mg | ORAL_TABLET | Freq: Every day | ORAL | 3 refills | Status: DC
Start: 2020-11-07 — End: 2021-06-04

## 2020-11-07 NOTE — Progress Notes (Signed)
Patient Care Team: [name], MD as PCP - General (Internal Medicine) [name], MD as PCP - Cardiology (Cardiology)   HPI  Dominique Bauer is a 75 y.o. female Seen in follow-up for atrial fibrillation for which she takes dofetilide since1/18. She was seen in the A. fib clinic 2/18 feeling much better in sinus rhythm.  anticoagulation with Apixoban.  Without bleeding       The patient denies chest pain, nocturnal dyspnea, orthopnea.  There have been no  lightheadedness or syncope.  Complains of dyspnea on exertion and some peripheral edema.  Occ palpitations           DATE TEST EF   11/17 Echo   65 %   4/19 Echo   65 % LAE mild mod  5/22 Echo   60-65% LAE Mod      Date Cr K Mg Hgb  2/18  0.72 4.6 1.9   6/18 0.74 4.1 1.6   11/18 0.8 4.6 2.2 13.2  12/19 0.73 4.7 2.1 13.2  10/21 0.82 4.4 1.8      Past Medical History:  Diagnosis Date   Body mass index 40.0-44.9, adult (HCC)    H/O measles    H/O mumps    H/O: whooping cough    History of chicken pox    Hypertension    Persistent atrial fibrillation (HCC)    Sciatica    Sleep apnea    not using aything at night right now/LH    Past Surgical History:  Procedure Laterality Date   ABDOMINAL HYSTERECTOMY     BREAST BIOPSY Right 02/01/2012   BREAST EXCISIONAL BIOPSY Bilateral    benign   CARDIOVERSION N/A 11/17/2015   Procedure: CARDIOVERSION;  Surgeon: [name], MD;  Location[facility] ENDOSCOPY;  Service: Cardiovascular;  Laterality: N/A;   CARDIOVERSION N/A 01/07/2016   Procedure: CARDIOVERSION;  Surgeon: [name], MD;  Location: [place];  Service: Cardiovascular;  Laterality: N/A;   CARDIOVERSION N/A 04/29/2016   Procedure: CARDIOVERSION;  Surgeon: Sanda Silveria Botz, MD;  Location: [facility] ENDOSCOPY;  Service: Cardiovascular;  Laterality: N/A;   DG  BONE DENSITY ([facility] HX)     INNER EAR SURGERY     NASAL SEPTUM SURGERY      Current Outpatient Medications  Medication Sig Dispense  Refill   acetaminophen (TYLENOL) 650 MG CR tablet Take 650 mg by mouth every 8 (eight) hours as needed for pain (arthritis).     apixaban (ELIQUIS) 5 MG TABS tablet Take 5 mg by mouth 2 (two) times daily.     calcium carbonate (OSCAL) 1500 (600 Ca) MG TABS tablet Take 1,500 mg by mouth daily with breakfast.     calcium carbonate (TUMS - DOSED IN MG ELEMENTAL CALCIUM) 500 MG chewable tablet Chew 1 tablet by mouth daily as needed for indigestion or heartburn.     cetirizine (ZYRTEC) 5 MG tablet Take 5 mg by mouth daily.     dofetilide (TIKOSYN) 125 MCG capsule TAKE THREE CAPSULES BY MOUTH TWICE DAILY (LEAVE in stock bottles) 540 capsule 1   Fluticasone-Salmeterol (ADVAIR) 100-50 MCG/DOSE AEPB SMARTSIG:By Mouth     furosemide (LASIX) 40 MG tablet As directed 30 tablet 0   glimepiride (AMARYL) 2 MG tablet Take 2-4 mg by mouth as directed. Patient takes 2 tablets in the morning and 2 tablet at night     guaiFENesin (MUCINEX) 600 MG 12 hr tablet Take 600 mg by mouth 2 (two) times daily.  HYDROcodone-acetaminophen (NORCO/VICODIN) 5-325 MG tablet Take 1 tablet by mouth every 6 (six) hours as needed for moderate pain.     L-LYSINE PO TAKE ONE TABLET BY MOUTH DAILY     linaGLIPtin (TRADJENTA PO) Take 5 mg by mouth daily.      losartan (COZAAR) 50 MG tablet Take 1 tablet by mouth daily.     magnesium oxide (MAG-OX) 400 MG tablet TAKE 1 TABLET BY MOUTH TWICE A DAY 180 tablet 3   metFORMIN (GLUCOPHAGE-XR) 500 MG 24 hr tablet Take 500 mg by mouth 2 (two) times daily.     metoprolol tartrate (LOPRESSOR) 25 MG tablet Take 25 mg by mouth. 1 tablet in the morning and 1/2 tablet in the evening     Multiple Vitamins-Minerals (ZINC PO) Take 1 tablet by mouth daily.     multivitamin-lutein (OCUVITE-LUTEIN) CAPS capsule Take 1 capsule by mouth daily.     Omega-3 Fatty Acids (FISH OIL) 1000 MG CAPS Take 1,000 mg by mouth 3 (three) times daily.      triamcinolone cream (KENALOG) 0.1 % Apply 1 application topically  daily as needed. For rash     BREO ELLIPTA 200-25 MCG/INH AEPB Inhale 1 puff into the lungs every morning. At the same time each day (Patient not taking: Reported on 11/07/2020)  0   NEXLETOL 180 MG TABS Take 1 tablet by mouth daily. (Patient not taking: Reported on 11/07/2020)     spironolactone (ALDACTONE) 25 MG tablet Take 0.5 tablets (12.5 mg total) by mouth daily. (Patient not taking: Reported on 11/07/2020) 45 tablet 3   No current facility-administered medications for this visit.    Allergies  Allergen Reactions   Penicillins Hives    severe   Other     Cigarette smoke and certain perfumes - chest tightening and runny nose   Neosporin [Neomycin-Bacitracin Zn-Polymyx] Rash      Review of Systems negative except from HPI and PMH  Physical Exam   BP (!) 176/77   Pulse (!) 59   Ht '5\' 7"'$  (1.702 m)   Wt 271 lb (122.9 kg)   SpO2 95%   BMI 42.44 kg/m  Well developed and well nourished in no acute distress HENT normal Neck supple with JVP-flat Clear   Regular rate and rhythm, no  gallop No  murmur Abd-soft with active BS No Clubbing cyanosis tr edema Skin-warm and dry A & Oriented  Grossly normal sensory and motor function  ECG sinus at 59 beats minute intervals 21/10/44 Poor R wave progression    Assessment and  Plan Afib persistent  OSA  Hypertension  Sinus Bradycardia  DOE  HFpEF-chronic  High Risk Medication Surveillance  dofetilide     Scant palpitations.  Atrial fibrillation seems well controlled on dofetilide.  We will continue on 25 mcg twice daily.  Need to check a metabolic profile and magnesium.  No bleeding on Eliquis.  Continue 5 mg twice daily.  No CBC in 2+ years.  We will check it today.  Blood pressure is significantly elevated  Rather than presumed spironolactone, we will check a metabolic profile as noted and begin her on amlodipine 2.5 mg daily.  Reviewed side effects.  Pt heart failure status is stable. Continue furosemide 40  mg.   Electrolytes will be checked today

## 2020-11-07 NOTE — Patient Instructions (Signed)
Medication Instructions:  Your physician has recommended you make the following change in your medication:   Begin Amlodipine, 2.'5mg'$  tablet, once daily. Be sure to monitor your blood pressure for the next couple of weeks. Try and measure 2-3 hours after you take your medications. Contact the office if your blood pressure remains > 135/90   Labwork: You will have labs drawn today: CBC, BMP, Mg   Testing/Procedures: None ordered.  Follow-Up: Your physician recommends that you schedule a follow-up appointment in:   6 months with an EP APP- Estrella Myrtle, PA or Tommye Standard, [place]   Any Other Special Instructions Will Be Listed Below (If Applicable).     If you need a refill on your cardiac medications before your next appointment, please call your pharmacy.

## 2020-11-08 LAB — CBC
Hematocrit: 37.4 % (ref 34.0–46.6)
Hemoglobin: 12.3 g/dL (ref 11.1–15.9)
MCH: 28.4 pg (ref 26.6–33.0)
MCHC: 32.9 g/dL (ref 31.5–35.7)
MCV: 86 fL (ref 79–97)
Platelets: 246 10*3/uL (ref 150–450)
RBC: 4.33 x10E6/uL (ref 3.77–5.28)
RDW: 12.9 % (ref 11.7–15.4)
WBC: 10.5 10*3/uL (ref 3.4–10.8)

## 2020-11-08 LAB — BASIC METABOLIC PANEL
BUN/Creatinine Ratio: 21 (ref 12–28)
BUN: 15 mg/dL (ref 8–27)
CO2: 21 mmol/L (ref 20–29)
Calcium: 10.6 mg/dL — ABNORMAL HIGH (ref 8.7–10.3)
Chloride: 99 mmol/L (ref 96–106)
Creatinine, Ser: 0.7 mg/dL (ref 0.57–1.00)
Glucose: 122 mg/dL — ABNORMAL HIGH (ref 65–99)
Potassium: 4.6 mmol/L (ref 3.5–5.2)
Sodium: 138 mmol/L (ref 134–144)
eGFR: 90 mL/min/{1.73_m2} (ref >59)

## 2020-11-08 LAB — MAGNESIUM: Magnesium: 2.1 mg/dL (ref 1.6–2.3)

## 2020-11-11 NOTE — Telephone Encounter (Signed)
ERROR

## 2020-11-17 DIAGNOSIS — M9902 Segmental and somatic dysfunction of thoracic region: Secondary | ICD-10-CM | POA: Diagnosis not present

## 2020-11-17 DIAGNOSIS — M5137 Other intervertebral disc degeneration, lumbosacral region: Secondary | ICD-10-CM | POA: Diagnosis not present

## 2020-11-17 DIAGNOSIS — M9903 Segmental and somatic dysfunction of lumbar region: Secondary | ICD-10-CM | POA: Diagnosis not present

## 2020-11-17 DIAGNOSIS — M9904 Segmental and somatic dysfunction of sacral region: Secondary | ICD-10-CM | POA: Diagnosis not present

## 2020-11-19 ENCOUNTER — Other Ambulatory Visit: Payer: Self-pay

## 2020-11-19 ENCOUNTER — Ambulatory Visit
Admission: RE | Admit: 2020-11-19 | Discharge: 2020-11-19 | Disposition: A | Discharge status: Home / Self Care | Payer: Medicare Other | Source: Ambulatory Visit | Attending: Internal Medicine | Admitting: Internal Medicine

## 2020-11-19 DIAGNOSIS — Z1231 Encounter for screening mammogram for malignant neoplasm of breast: Secondary | ICD-10-CM

## 2020-11-26 DIAGNOSIS — I10 Essential (primary) hypertension: Secondary | ICD-10-CM | POA: Diagnosis not present

## 2020-11-26 DIAGNOSIS — E1169 Type 2 diabetes mellitus with other specified complication: Secondary | ICD-10-CM | POA: Diagnosis not present

## 2020-11-26 DIAGNOSIS — E7849 Other hyperlipidemia: Secondary | ICD-10-CM | POA: Diagnosis not present

## 2020-11-27 DIAGNOSIS — I10 Essential (primary) hypertension: Secondary | ICD-10-CM | POA: Diagnosis not present

## 2020-11-27 DIAGNOSIS — E1169 Type 2 diabetes mellitus with other specified complication: Secondary | ICD-10-CM | POA: Diagnosis not present

## 2020-11-27 DIAGNOSIS — E7849 Other hyperlipidemia: Secondary | ICD-10-CM | POA: Diagnosis not present

## 2020-12-08 DIAGNOSIS — M5137 Other intervertebral disc degeneration, lumbosacral region: Secondary | ICD-10-CM | POA: Diagnosis not present

## 2020-12-08 DIAGNOSIS — M9903 Segmental and somatic dysfunction of lumbar region: Secondary | ICD-10-CM | POA: Diagnosis not present

## 2020-12-08 DIAGNOSIS — M9904 Segmental and somatic dysfunction of sacral region: Secondary | ICD-10-CM | POA: Diagnosis not present

## 2020-12-08 DIAGNOSIS — M9902 Segmental and somatic dysfunction of thoracic region: Secondary | ICD-10-CM | POA: Diagnosis not present

## 2020-12-16 DIAGNOSIS — E1142 Type 2 diabetes mellitus with diabetic polyneuropathy: Secondary | ICD-10-CM | POA: Diagnosis not present

## 2020-12-16 DIAGNOSIS — M79676 Pain in unspecified toe(s): Secondary | ICD-10-CM | POA: Diagnosis not present

## 2020-12-16 DIAGNOSIS — L84 Corns and callosities: Secondary | ICD-10-CM | POA: Diagnosis not present

## 2020-12-16 DIAGNOSIS — B351 Tinea unguium: Secondary | ICD-10-CM | POA: Diagnosis not present

## 2020-12-25 DIAGNOSIS — M8588 Other specified disorders of bone density and structure, other site: Secondary | ICD-10-CM | POA: Diagnosis not present

## 2020-12-25 DIAGNOSIS — M810 Age-related osteoporosis without current pathological fracture: Secondary | ICD-10-CM | POA: Diagnosis not present

## 2021-01-06 ENCOUNTER — Other Ambulatory Visit: Payer: Self-pay | Admitting: Internal Medicine

## 2021-01-12 DIAGNOSIS — M5137 Other intervertebral disc degeneration, lumbosacral region: Secondary | ICD-10-CM | POA: Diagnosis not present

## 2021-01-12 DIAGNOSIS — M9904 Segmental and somatic dysfunction of sacral region: Secondary | ICD-10-CM | POA: Diagnosis not present

## 2021-01-12 DIAGNOSIS — M9903 Segmental and somatic dysfunction of lumbar region: Secondary | ICD-10-CM | POA: Diagnosis not present

## 2021-01-12 DIAGNOSIS — M9902 Segmental and somatic dysfunction of thoracic region: Secondary | ICD-10-CM | POA: Diagnosis not present

## 2021-01-27 DIAGNOSIS — G4733 Obstructive sleep apnea (adult) (pediatric): Secondary | ICD-10-CM | POA: Diagnosis not present

## 2021-02-12 DIAGNOSIS — M9902 Segmental and somatic dysfunction of thoracic region: Secondary | ICD-10-CM | POA: Diagnosis not present

## 2021-02-12 DIAGNOSIS — M9903 Segmental and somatic dysfunction of lumbar region: Secondary | ICD-10-CM | POA: Diagnosis not present

## 2021-02-12 DIAGNOSIS — M5137 Other intervertebral disc degeneration, lumbosacral region: Secondary | ICD-10-CM | POA: Diagnosis not present

## 2021-02-12 DIAGNOSIS — M9904 Segmental and somatic dysfunction of sacral region: Secondary | ICD-10-CM | POA: Diagnosis not present

## 2021-02-24 DIAGNOSIS — E1142 Type 2 diabetes mellitus with diabetic polyneuropathy: Secondary | ICD-10-CM | POA: Diagnosis not present

## 2021-02-24 DIAGNOSIS — M79676 Pain in unspecified toe(s): Secondary | ICD-10-CM | POA: Diagnosis not present

## 2021-02-24 DIAGNOSIS — L84 Corns and callosities: Secondary | ICD-10-CM | POA: Diagnosis not present

## 2021-02-24 DIAGNOSIS — B351 Tinea unguium: Secondary | ICD-10-CM | POA: Diagnosis not present

## 2021-02-26 DIAGNOSIS — I10 Essential (primary) hypertension: Secondary | ICD-10-CM | POA: Diagnosis not present

## 2021-02-26 DIAGNOSIS — E7849 Other hyperlipidemia: Secondary | ICD-10-CM | POA: Diagnosis not present

## 2021-02-26 DIAGNOSIS — E1169 Type 2 diabetes mellitus with other specified complication: Secondary | ICD-10-CM | POA: Diagnosis not present

## 2021-03-09 DIAGNOSIS — E119 Type 2 diabetes mellitus without complications: Secondary | ICD-10-CM | POA: Diagnosis not present

## 2021-04-29 DIAGNOSIS — G4733 Obstructive sleep apnea (adult) (pediatric): Secondary | ICD-10-CM | POA: Diagnosis not present

## 2021-05-03 NOTE — Progress Notes (Addendum)
Cardiology Office Note Date:  05/06/2021  Patient ID:  Dominique Bauer, Dominique Bauer July 13, 1945, MRN [id number] PCP:  [name], MD  Cardiologist:  Dr. Johnsie Cancel Electrophysiologist: Dr. [name]     Chief Complaint: 64mo  History of Present Illness: Dominique Bauer is a 76 y.o. female with history of AFib, HTN, sleep apnea w/CPAP, HFpEF, chronic CHF  She domes in today to be seen for Dr. [name], last seen by him Aug 2022, feeling well, occ palpitations. No changes to rhythm management, planned for surveillance labs, BP was high and started on amlodipine  TODAY She tells me she does not feel great, but not new, and feels is 2/2 her weight and DM. She denies any CP, palpitations or cardiac awareness She does not think she has had Afib since on tikosyn. She has DOE with heavier e=activities, increased pace, stairs, this is her baseline for years, unchanged Uses her spironolactone PRN, infrequently, lasix also PRN, uses now and then No near syncope or syncope No bleeding  AAD Hx Diagnosed 2017 Flecainide failed to maintain SR, 2017 Tikosyn (started 2018)  Past Medical History:  Diagnosis Date   Body mass index 40.0-44.9, adult ([place])    H/O measles    H/O mumps    H/O: whooping cough    History of chicken pox    Hypertension    Persistent atrial fibrillation (HCC)    Sciatica    Sleep apnea    not using aything at night right now/LH    Past Surgical History:  Procedure Laterality Date   ABDOMINAL HYSTERECTOMY     BREAST BIOPSY Right 02/01/2012   BREAST EXCISIONAL BIOPSY Bilateral    benign   CARDIOVERSION N/A 11/17/2015   Procedure: CARDIOVERSION;  Surgeon: [name], MD;  Location: [facility] ENDOSCOPY;  Service: Cardiovascular;  Laterality: N/A;   CARDIOVERSION N/A 01/07/2016   Procedure: CARDIOVERSION;  Surgeon: [name], MD;  Location: [facility] ENDOSCOPY;  Service: Cardiovascular;  Laterality: N/A;   CARDIOVERSION N/A 04/29/2016   Procedure: CARDIOVERSION;  Surgeon:  Sanda Klein, MD;  Location: [facility] ENDOSCOPY;  Service: Cardiovascular;  Laterality: N/A;   DG  BONE DENSITY ([facility] HX)     INNER EAR SURGERY     NASAL SEPTUM SURGERY      Current Outpatient Medications  Medication Sig Dispense Refill   acetaminophen (TYLENOL) 650 MG CR tablet Take 650 mg by mouth every 8 (eight) hours as needed for pain (arthritis).     apixaban (ELIQUIS) 5 MG TABS tablet Take 5 mg by mouth 2 (two) times daily.     calcium carbonate (TUMS - DOSED IN MG ELEMENTAL CALCIUM) 500 MG chewable tablet Chew 1 tablet by mouth daily as needed for indigestion or heartburn.     Calcium Carbonate-Vit D-Min (CALTRATE PLUS PO) Take 1 capsule by mouth in the morning, at noon, and at bedtime.     cetirizine (ZYRTEC) 5 MG tablet Take 5 mg by mouth daily.     dofetilide (TIKOSYN) 125 MCG capsule TAKE THREE CAPSULES BY MOUTH TWICE DAILY (LEAVE in stock bottles) 540 capsule 3   Fluticasone-Salmeterol (ADVAIR) 100-50 MCG/DOSE AEPB Inhale 1 puff into the lungs daily as needed (shortness of breath).     furosemide (LASIX) 40 MG tablet As directed 30 tablet 0   glimepiride (AMARYL) 2 MG tablet Take 2-4 mg by mouth as directed. Patient takes 2 tablets in the morning and 2 tablet at night     guaiFENesin (MUCINEX) 600 MG 12 hr tablet  Take 600 mg by mouth daily.     HYDROcodone-acetaminophen (NORCO/VICODIN) 5-325 MG tablet Take 1 tablet by mouth every 6 (six) hours as needed for moderate pain.     L-LYSINE PO TAKE ONE TABLET BY MOUTH DAILY     levalbuterol (XOPENEX) 0.63 MG/3ML nebulizer solution 1 Ampule(s) Via Nebulizer 4 Times Daily as needed for shortness of breath/congestion     linaGLIPtin (TRADJENTA PO) Take 5 mg by mouth daily.      losartan (COZAAR) 50 MG tablet Take 1 tablet by mouth daily.     magnesium oxide (MAG-OX) 400 MG tablet TAKE 1 TABLET BY MOUTH TWICE A DAY 180 tablet 3   metFORMIN (GLUCOPHAGE-XR) 500 MG 24 hr tablet Take 500 mg by mouth 2 (two) times daily.     metoprolol tartrate  (LOPRESSOR) 25 MG tablet Take 25 mg by mouth. 1 tablet in the morning and 1/2 tablet in the evening     Multiple Vitamins-Minerals (ZINC PO) Take 1 tablet by mouth daily.     multivitamin-lutein (OCUVITE-LUTEIN) CAPS capsule Take 1 capsule by mouth daily.     Omega-3 Fatty Acids (FISH OIL) 1000 MG CAPS Take 1,000 mg by mouth 3 (three) times daily.      spironolactone (ALDACTONE) 25 MG tablet Take 0.5 tablets (12.5 mg total) by mouth daily. (Patient taking differently: Take 12.5 mg by mouth daily as needed (swelling).) 45 tablet 3   triamcinolone cream (KENALOG) 0.1 % Apply 1 application topically daily as needed. For rash     amLODipine (NORVASC) 2.5 MG tablet Take 1 tablet (2.5 mg total) by mouth daily. (Patient not taking: Reported on 05/06/2021) 180 tablet 3   BREO ELLIPTA 200-25 MCG/INH AEPB Inhale 1 puff into the lungs every morning. At the same time each day (Patient not taking: Reported on 11/07/2020)  0   calcium carbonate (OSCAL) 1500 (600 Ca) MG TABS tablet Take 1,500 mg by mouth daily with breakfast. (Patient not taking: Reported on 05/06/2021)     NEXLETOL 180 MG TABS Take 1 tablet by mouth daily. (Patient not taking: Reported on 11/07/2020)     No current facility-administered medications for this visit.    Allergies:   Penicillins, Other, and Neosporin [neomycin-bacitracin zn-polymyx]   Social History:  The patient  reports that she has never smoked. She has been exposed to tobacco smoke. She has never used smokeless tobacco. She reports that she does not drink alcohol and does not use drugs.   Family History:  The patient's family history includes Breast cancer in her maternal aunt and sister; Hypertension in her mother.  ROS:  Please see the history of present illness.    All other systems are reviewed and otherwise negative.   PHYSICAL EXAM:  VS:  BP (!) 150/82    Pulse (!) 56    Ht 5\' 8"  (1.727 m)    Wt 270 lb (122.5 kg)    SpO2 95%    BMI 41.05 kg/m  BMI: Body mass index is  41.05 kg/m. Well nourished, well developed, in no acute distress HEENT: normocephalic, atraumatic Neck: no JVD, carotid bruits or masses Cardiac:  RRR; no significant murmurs, no rubs, or gallops Lungs:  CTA b/l, no wheezing, rhonchi or rales Abd: soft, nontender MS: no deformity or atrophy Ext: no edema, she is wearing compression hose LLE (reports some chronic swelling since remote DVT) Skin: warm and dry, no rash Neuro:  No gross deficits appreciated Psych: euthymic mood, full affect  PPM site is stable,  no tethering or discomfort   EKG:  Done today and reviewed by myself shows  SB 56bpm, QTc 41ms   08/14/2020: TTE IMPRESSIONS   1. Left ventricular ejection fraction, by estimation, is 60 to 65%. The  left ventricle has normal function. The left ventricle has no regional  wall motion abnormalities. The left ventricular internal cavity size was  mildly dilated. Left ventricular  diastolic parameters are consistent with Grade I diastolic dysfunction  (impaired relaxation).   2. Right ventricular systolic function is normal. The right ventricular  size is normal. Tricuspid regurgitation signal is inadequate for assessing  PA pressure.   3. Left atrial size was moderately dilated.   4. Right atrial size was mildly dilated.   5. The mitral valve is normal in structure. No evidence of mitral valve  regurgitation. No evidence of mitral stenosis.   6. The aortic valve is tricuspid. Aortic valve regurgitation is not  visualized. No aortic stenosis is present.   Comparison(s): No significant change from prior study.    12/04/2015: EST Blood pressure demonstrated a normal response to exercise. There was no ST segment deviation noted during stress. Negative, adequate stress test   Recent Labs: 11/07/2020: BUN 15; Creatinine, Ser 0.70; Hemoglobin 12.3; Magnesium 2.1; Platelets 246; Potassium 4.6; Sodium 138  No results found for requested labs within last 8760 hours.   CrCl  cannot be calculated (Patient's most recent lab result is older than the maximum 21 days allowed.).   Wt Readings from Last 3 Encounters:  05/06/21 270 lb (122.5 kg)  11/07/20 271 lb (122.9 kg)  07/04/20 273 lb (123.8 kg)     Other studies reviewed: Additional studies/records reviewed today include: summarized above  ASSESSMENT AND PLAN:  Paroxysmal Afib CHA2DS2Vasc is 5, on Eliquis, appropriately dosed Tikosyn stable QTc zero burden by symptoms  Labs today  Chronic CHF HFpEF No exam findings or symptoms of volume OL  HTN Asked to monitor at home, has a OV with Dr. Claiborne Billings next month   Disposition: F/u with [place] in 84mo, sooner if needed  Current medicines are reviewed at length with the patient today.  The patient did not have any concerns regarding medicines.  Venetia Night, PA-C 05/06/2021 5:08 PM     [address] [place] [id number] [phone] (office)  [phone] (fax)

## 2021-05-05 DIAGNOSIS — L84 Corns and callosities: Secondary | ICD-10-CM | POA: Diagnosis not present

## 2021-05-05 DIAGNOSIS — E1142 Type 2 diabetes mellitus with diabetic polyneuropathy: Secondary | ICD-10-CM | POA: Diagnosis not present

## 2021-05-05 DIAGNOSIS — B351 Tinea unguium: Secondary | ICD-10-CM | POA: Diagnosis not present

## 2021-05-05 DIAGNOSIS — M79676 Pain in unspecified toe(s): Secondary | ICD-10-CM | POA: Diagnosis not present

## 2021-05-06 ENCOUNTER — Ambulatory Visit: Payer: Medicare Other | Admitting: Physician Assistant

## 2021-05-06 ENCOUNTER — Encounter: Payer: Self-pay | Admitting: Physician Assistant

## 2021-05-06 ENCOUNTER — Other Ambulatory Visit: Payer: Self-pay

## 2021-05-06 VITALS — BP 150/82 | HR 56 | Ht 68.0 in | Wt 270.0 lb

## 2021-05-06 DIAGNOSIS — Z79899 Other long term (current) drug therapy: Secondary | ICD-10-CM

## 2021-05-06 DIAGNOSIS — I480 Paroxysmal atrial fibrillation: Secondary | ICD-10-CM

## 2021-05-06 DIAGNOSIS — Z5181 Encounter for therapeutic drug level monitoring: Secondary | ICD-10-CM | POA: Diagnosis not present

## 2021-05-06 DIAGNOSIS — I5030 Unspecified diastolic (congestive) heart failure: Secondary | ICD-10-CM

## 2021-05-06 NOTE — Patient Instructions (Addendum)
Medication Instructions:   Your physician recommends that you continue on your current medications as directed. Please refer to the Current Medication list given to you today.   *If you need a refill on your cardiac medications before your next appointment, please call your pharmacy*   Lab Work: [place]   If you have labs (blood work) drawn today and your tests are completely normal, you will receive your results only by: [place] (if you have MyChart) OR A paper copy in the mail If you have any lab test that is abnormal or we need to change your treatment, we will call you to review the results.   Testing/Procedures: BMET  MAG AND CBC TODAY      Follow-Up: At [facility], you and your health needs are our priority.  As part of our continuing mission to provide you with exceptional heart care, we have created designated Provider Care Teams.  These Care Teams include your primary Cardiologist (physician) and Advanced Practice Providers (APPs -  Physician Assistants and Nurse Practitioners) who all work together to provide you with the care you need, when you need it.  We recommend signing up for the patient portal called "MyChart".  Sign up information is provided on this After Visit Summary.  MyChart is used to connect with patients for Virtual Visits (Telemedicine).  Patients are able to view lab/test results, encounter notes, upcoming appointments, etc.  Non-urgent messages can be sent to your provider as well.   To learn more about what you can do with MyChart, go to [email or web address].    Your next appointment:   6 month(s)  The format for your next appointment:   In Person  Provider:   You may see Dr. [name]  or one of the following Advanced Practice Providers on your designated Care Team:   Tommye Standard, [place] Legrand Como "Jonni Sanger" Chalmers Cater, PA-C{   Other Instructions  KEEP A LOG OF YOUR BLOOD PRESSURES  AND MONITOR IF BLOOD PRESSURE  TOP NUMBER RUNNING  OVER 140 FREQUENTLY

## 2021-05-07 LAB — CBC
Hematocrit: 38.5 % (ref 34.0–46.6)
Hemoglobin: 12.9 g/dL (ref 11.1–15.9)
MCH: 29.4 pg (ref 26.6–33.0)
MCHC: 33.5 g/dL (ref 31.5–35.7)
MCV: 88 fL (ref 79–97)
Platelets: 291 10*3/uL (ref 150–450)
RBC: 4.39 x10E6/uL (ref 3.77–5.28)
RDW: 13 % (ref 11.7–15.4)
WBC: 11.2 10*3/uL — ABNORMAL HIGH (ref 3.4–10.8)

## 2021-05-07 LAB — BASIC METABOLIC PANEL
BUN/Creatinine Ratio: 22 (ref 12–28)
BUN: 15 mg/dL (ref 8–27)
CO2: 21 mmol/L (ref 20–29)
Calcium: 10.5 mg/dL — ABNORMAL HIGH (ref 8.7–10.3)
Chloride: 99 mmol/L (ref 96–106)
Creatinine, Ser: 0.69 mg/dL (ref 0.57–1.00)
Glucose: 171 mg/dL — ABNORMAL HIGH (ref 70–99)
Potassium: 4.6 mmol/L (ref 3.5–5.2)
Sodium: 136 mmol/L (ref 134–144)
eGFR: 90 mL/min/{1.73_m2} (ref >59)

## 2021-05-07 LAB — MAGNESIUM: Magnesium: 2 mg/dL (ref 1.6–2.3)

## 2021-05-11 ENCOUNTER — Ambulatory Visit: Payer: Medicare Other | Admitting: Cardiovascular Disease

## 2021-05-28 DIAGNOSIS — Z0000 Encounter for general adult medical examination without abnormal findings: Secondary | ICD-10-CM | POA: Diagnosis not present

## 2021-05-28 DIAGNOSIS — I10 Essential (primary) hypertension: Secondary | ICD-10-CM | POA: Diagnosis not present

## 2021-05-28 DIAGNOSIS — E1169 Type 2 diabetes mellitus with other specified complication: Secondary | ICD-10-CM | POA: Diagnosis not present

## 2021-05-28 DIAGNOSIS — E7849 Other hyperlipidemia: Secondary | ICD-10-CM | POA: Diagnosis not present

## 2021-06-04 ENCOUNTER — Encounter: Payer: Self-pay | Admitting: Cardiovascular Disease

## 2021-06-04 ENCOUNTER — Ambulatory Visit: Payer: Medicare Other | Admitting: Cardiovascular Disease

## 2021-06-04 ENCOUNTER — Other Ambulatory Visit: Payer: Self-pay

## 2021-06-04 VITALS — BP 135/83 | Ht 68.0 in | Wt 269.4 lb

## 2021-06-04 DIAGNOSIS — G4733 Obstructive sleep apnea (adult) (pediatric): Secondary | ICD-10-CM

## 2021-06-04 DIAGNOSIS — R600 Localized edema: Secondary | ICD-10-CM

## 2021-06-04 DIAGNOSIS — Z9989 Dependence on other enabling machines and devices: Secondary | ICD-10-CM | POA: Diagnosis not present

## 2021-06-04 DIAGNOSIS — I480 Paroxysmal atrial fibrillation: Secondary | ICD-10-CM | POA: Diagnosis not present

## 2021-06-04 DIAGNOSIS — I10 Essential (primary) hypertension: Secondary | ICD-10-CM | POA: Diagnosis not present

## 2021-06-04 DIAGNOSIS — E119 Type 2 diabetes mellitus without complications: Secondary | ICD-10-CM | POA: Diagnosis not present

## 2021-06-04 DIAGNOSIS — H02055 Trichiasis without entropian left lower eyelid: Secondary | ICD-10-CM | POA: Diagnosis not present

## 2021-06-04 DIAGNOSIS — H40023 Open angle with borderline findings, high risk, bilateral: Secondary | ICD-10-CM | POA: Diagnosis not present

## 2021-06-04 DIAGNOSIS — H25813 Combined forms of age-related cataract, bilateral: Secondary | ICD-10-CM | POA: Diagnosis not present

## 2021-06-04 NOTE — Progress Notes (Addendum)
Cardiology Office Note    Date:  06/04/2021   ID:  Dominique Bauer, DOB 08/19/1945, MRN [id number]  PCP:  [name], MD  Cardiologist:  Shelva Majestic, MD (sleep), Dr. Johnsie Cancel, Dr.. Klein (EP)  13 month F/U sleep evaluation  History of Present Illness:  Dominique Bauer is a 76 y.o. female who presents for a 13 month follow-up sleep evaluation.  Dominique Bauer is a patient of Dr. Johnsie Cancel and and Dr. [name] who has a history of hypertension, and atrial fibrillation.  She had a long history of persistent atrial fibrillation and had failed previous antiarrhythmic therapy and cardioversions, but recently underwent successful cardioversion with dofetilide.  Her left atrial size is mildly enlarged at 43 mm.  Due to concerns for sleep apnea she was referred for diagnostic sleep study which was done on 02/15/2016.  This revealed severe obstructive sleep apnea with an AHI of 31.2 per hour.  Sleep apnea was more severe during Rehm sleep with an AHI of 58.3 per hour and with supine position at 46.1 per hour.  She had significant oxygen desaturation to a nadir of 69% on her diagnostic study.  She socially underwent a CPAP titration trial on 05/05/2016.  It was initially recommended that she use CPAP auto with a minimum pressure of 10 and maximum pressure of 18 cm water pressure with heated humidification.  She was unable to tolerate the high pressures and ultimately this was reduced to a maximum pressure of 16.  She has been using a fullface mask.  A download was obtained from 06/21/2016 through 07/20/2016.  This demonstrated excellent compliance with 100% usage stays at 97% of days with usage greater than 4 hours.  However, she was only sleeping 4 hours and 30 minutes.  There was no significant leak.  Her 95th percentile pressure was 13.4 with a maximum average pressure of 14.2. Upon further questioning, she states that she typically goes to bed between 11 PM and midnight and often is in bed until 10 AM  in the morning.  When I saw her, she was not using CPAP through the duration of her time in bed.  She felt improved since initiating CPAP.   I saw her in July 2018 and prior to that visit she was having difficulty with her mask with tenderness on the bridge of her nose.  At that time, I also had significant discussion with her and recommended the new Respironics dream were full face mask.  Apparently, she took a prescription to [facility].  At the time they did not have this new mask which had just become available.  Over the past several months, she has significantly increased her sleep duration with CPAP use.  I obtained a new download in the office  from July 1-30, 2018 which demonstrated 100% compliancec with usage stays and uses greater than 4 hours and averaging 6 hours and 57 minutes of CPAP use per night.  She has ResMed AirSense 10 Auto system and has a minimum pressure setting at 10 and maximum of 14.  Her 95th percent.  Average pressure was 13.2.  At times she still having increased leak around her nasal bridge.  Her AHI was 1.1.  She was been unaware of any recurrent atrial fibrillation.    I saw her in October 2019 at which time continued to use CPAP with 100% compliance.  I obtained a new download from September 22 through January 16, 2018 which confirmed 100% compliance.  Her  ResMed air sense auto set is set at a minimum pressure of 10 with maximum of 14.  95th percentile pressure is 11.6 with a maximum average pressure of 12.1.  AHI remains excellent at 0.5.  She feels well.  She denies any residual daytime sleepiness.  A new Epworth scale was calculated in the office  and this endorsed at 7.  She was unaware of breakthrough snoring.  She denied any  bruxism, restless legs, hypnagogic hallucinations or cataplexy.    When I saw her in November 2020 she continued to do well. She last saw Dr. Alfonse Spruce in September 2020 and was maintaining sinus rhythm on Tikosyn and was without QTC  prolongation.  Her hypertension was controlled and she was on a low-sodium Dash type diet.  Presently, she continues to use CPAP.  A new download was obtained from October 13 through February 07, 2019 which confirms excellent compliance.  However she does have a mask leak.  She has been using a full facemask.  Her CPAP has been set at a minimum pressure of 10 with a maximum pressure of 14.  95 percentile pressure is 11.0 with a maximum average pressure of 11.8.  AHI is excellent at 0.4.  [employer] is her DME company who brought out advance home care.  Of note, for for family members including a daughter, son-in-law, and 2 grandchildren have come down with COVID-19 infection.  She has not had any recent exposure to them.  During her evaluation, due to her significant mask leak I recommended changing her fullface mask to a ResMed air fit F 30i mask.  I last saw her in February 17 2.  She felt well and she admits to excellent compliance with her CPAP therapy and has not had any recurrent episodes of arrhythmia.  I obtained a download from April 14, 2020 through through May 13, 2020.  Compliance is 100% with average use at 8 hours and 23 minutes.  Her CPAP is set at a range of 10 to 14 cm of water and AHI is excellent at 0.8.  95th percentile pressure is 12.0 with maximum average pressure 12.8 cm of water.  Presently she goes to bed around 1 AM and wakes up around 11 AM.  She is sleeping well.  She denies breakthrough snoring.  A new Epworth Sleepiness Scale score was calculated in the office today and this endorsed at 5 arguing against residual daytime sleepiness.  States her blood pressure has been stable and she continues to tolerate Tikosyn without breakthrough atrial arrhythmia.  Since my last evaluation, she has been seen by Dr. [name] as well as Dr. Johnsie Cancel and last saw Tommye Standard, PA-C in February 2023.  She is unaware of any recurrent atrial fibrillation.  She admits to bilateral lower extremity edema.   She is no longer taking spironolactone and takes furosemide 40 mg 1 time per week.  She continues to be on losartan 50 mg, metoprolol tartrate 25 mg in the morning and 12.5 mg in the evening Tikosyn and takes three 125 mg capsules twice a day.  She is anticoagulated on Eliquis.  She is no longer taking Nexletol.  She is diabetic.  She continues to use CPAP with 100% compliance.  A new download was obtained in the office today which showed 10 percent compliance with average use at 8 hours and 20 minutes.  Typically she goes to bed at 1 AM and wakes up around 10.  Her pressure is set at a range of  10 to 14 cm of water and her 95th percentile pressure is 11.1 with maximum average pressure 11.8.  AHI is excellent at 0.6.  She does have mild mask leak.  She now has her old fullface mask and did not seem to like the F30i mask any better.  She presents for evaluation.   Past Medical History:  Diagnosis Date   Body mass index 40.0-44.9, adult ([place])    H/O measles    H/O mumps    H/O: whooping cough    History of chicken pox    Hypertension    Persistent atrial fibrillation (HCC)    Sciatica    Sleep apnea    not using aything at night right now/LH    Past Surgical History:  Procedure Laterality Date   ABDOMINAL HYSTERECTOMY     BREAST BIOPSY Right 02/01/2012   BREAST EXCISIONAL BIOPSY Bilateral    benign   CARDIOVERSION N/A 11/17/2015   Procedure: CARDIOVERSION;  Surgeon: [name], MD;  Location: [facility] ENDOSCOPY;  Service: Cardiovascular;  Laterality: N/A;   CARDIOVERSION N/A 01/07/2016   Procedure: CARDIOVERSION;  Surgeon: [name], MD;  Location: [place];  Service: Cardiovascular;  Laterality: N/A;   CARDIOVERSION N/A 04/29/2016   Procedure: CARDIOVERSION;  Surgeon: Sanda Klein, MD;  Location: [facility] ENDOSCOPY;  Service: Cardiovascular;  Laterality: N/A;   DG  BONE DENSITY ([place] [facility])     INNER EAR SURGERY     NASAL SEPTUM SURGERY      Current Medications: Outpatient Medications  Prior to Visit  Medication Sig Dispense Refill   acetaminophen (TYLENOL) 650 MG CR tablet Take 650 mg by mouth every 8 (eight) hours as needed for pain (arthritis).     apixaban (ELIQUIS) 5 MG TABS tablet Take 5 mg by mouth 2 (two) times daily.     BREO ELLIPTA 200-25 MCG/INH AEPB Inhale 1 puff into the lungs every morning. At the same time each day  0   calcium carbonate (OSCAL) 1500 (600 Ca) MG TABS tablet Take 1,500 mg by mouth daily with breakfast.     calcium carbonate (TUMS - DOSED IN MG ELEMENTAL CALCIUM) 500 MG chewable tablet Chew 1 tablet by mouth daily as needed for indigestion or heartburn.     Calcium Carbonate-Vit D-Min (CALTRATE PLUS PO) Take 1 capsule by mouth in the morning, at noon, and at bedtime.     cetirizine (ZYRTEC) 5 MG tablet Take 5 mg by mouth daily.     dofetilide (TIKOSYN) 125 MCG capsule TAKE THREE CAPSULES BY MOUTH TWICE DAILY (LEAVE in stock bottles) 540 capsule 3   Fluticasone-Salmeterol (ADVAIR) 100-50 MCG/DOSE AEPB Inhale 1 puff into the lungs daily as needed (shortness of breath).     furosemide (LASIX) 40 MG tablet As directed 30 tablet 0   glimepiride (AMARYL) 2 MG tablet Take 2-4 mg by mouth as directed. Patient takes 2 tablets in the morning and 2 tablet at night     guaiFENesin (MUCINEX) 600 MG 12 hr tablet Take 600 mg by mouth daily.     HYDROcodone-acetaminophen (NORCO/VICODIN) 5-325 MG tablet Take 1 tablet by mouth every 6 (six) hours as needed for moderate pain.     L-LYSINE PO TAKE ONE TABLET BY MOUTH DAILY     levalbuterol (XOPENEX) 0.63 MG/3ML nebulizer solution 1 Ampule(s) Via Nebulizer 4 Times Daily as needed for shortness of breath/congestion     linaGLIPtin (TRADJENTA PO) Take 5 mg by mouth daily.      losartan (COZAAR) 50  MG tablet Take 1 tablet by mouth daily.     magnesium oxide (MAG-OX) 400 MG tablet TAKE 1 TABLET BY MOUTH TWICE A DAY 180 tablet 3   metFORMIN (GLUCOPHAGE-XR) 500 MG 24 hr tablet Take 500 mg by mouth 2 (two) times daily.      metoprolol tartrate (LOPRESSOR) 25 MG tablet Take 25 mg by mouth. 1 tablet in the morning and 1/2 tablet in the evening     Multiple Vitamins-Minerals (ZINC PO) Take 1 tablet by mouth daily.     multivitamin-lutein (OCUVITE-LUTEIN) CAPS capsule Take 1 capsule by mouth daily.     Omega-3 Fatty Acids (FISH OIL) 1000 MG CAPS Take 1,000 mg by mouth 3 (three) times daily.      triamcinolone cream (KENALOG) 0.1 % Apply 1 application topically daily as needed. For rash     NEXLETOL 180 MG TABS Take 1 tablet by mouth daily.     spironolactone (ALDACTONE) 25 MG tablet Take 0.5 tablets (12.5 mg total) by mouth daily. (Patient taking differently: Take 12.5 mg by mouth daily as needed (swelling).) 45 tablet 3   amLODipine (NORVASC) 2.5 MG tablet Take 1 tablet (2.5 mg total) by mouth daily. (Patient not taking: Reported on 05/06/2021) 180 tablet 3   No facility-administered medications prior to visit.     Allergies:   Penicillins, Other, and Neosporin [neomycin-bacitracin zn-polymyx]   Social History   Socioeconomic History   Marital status: Married    Spouse name: Not on file   Number of children: Not on file   Years of education: Not on file   Highest education level: Not on file  Occupational History   Not on file  Tobacco Use   Smoking status: Never    Passive exposure: Past   Smokeless tobacco: Never  Substance and Sexual Activity   Alcohol use: No   Drug use: No   Sexual activity: Not on file  Other Topics Concern   Not on file  Social History Narrative   Retired Marine scientist.  Lives in [place].   Social Determinants of Health   Financial Resource Strain: Not on file  Food Insecurity: Not on file  Transportation Needs: Not on file  Physical Activity: Not on file  Stress: Not on file  Social Connections: Not on file     Family History:  The patient's family history includes Breast cancer in her maternal aunt and sister; Hypertension in her mother.   ROS General: Negative; No fevers,  chills, or night sweats; morbid obesity HEENT: Negative; No changes in vision or hearing, sinus congestion, difficulty swallowing Pulmonary: Negative; No cough, wheezing, shortness of breath, hemoptysis Cardiovascular: No recurrent AF; positive for bilateral lower extremity edema GI: Negative; No nausea, vomiting, diarrhea, or abdominal pain GU: Negative; No dysuria, hematuria, or difficulty voiding Musculoskeletal: Negative; no myalgias, joint pain, or weakness Hematologic/Oncology: Negative; no easy bruising, bleeding Endocrine: Positive for diabetes Neuro: Negative; no changes in balance, headaches Skin: Negative; No rashes or skin lesions Psychiatric: Negative; No behavioral problems, depression Sleep:See HPI Other comprehensive 14 point system review is negative.   PHYSICAL EXAM:   VS:  BP 135/83    Ht '5\' 8"'$  (1.727 m)    Wt 269 lb 6.4 oz (122.2 kg)    SpO2 96%    BMI 40.96 kg/m   Repeat blood pressure by me was 150/80  Wt Readings from Last 3 Encounters:  06/04/21 269 lb 6.4 oz (122.2 kg)  05/06/21 270 lb (122.5 kg)  11/07/20 271 lb (  122.9 kg)   General: Alert, oriented, no distress.  Morbid obesity Skin: normal turgor, no rashes, warm and dry HEENT: Normocephalic, atraumatic. Pupils equal round and reactive to light; sclera anicteric; extraocular muscles intact; Fundi ** Nose without nasal septal hypertrophy Mouth/Parynx benign; Mallinpatti scale 4 Neck: No JVD, no carotid bruits; normal carotid upstroke Lungs: clear to ausculatation and percussion; no wheezing or rales Chest wall: without tenderness to palpitation Heart: PMI not displaced, RRR, s1 s2 normal, 1/6 systolic murmur, no diastolic murmur, no rubs, gallops, thrills, or heaves Abdomen: Central adiposity; diastases recti soft, nontender; no hepatosplenomehaly, BS+; abdominal aorta nontender and not dilated by palpation. Back: no CVA tenderness Pulses 2+ Musculoskeletal: full range of motion, normal strength, no  joint deformities Extremities: 1-2+ bilateral lower extremity edema no clubbing cyanosis, Homan's sign negative  Neurologic: grossly nonfocal; Cranial nerves grossly wnl Psychologic: Normal mood and affect    Studies/Labs Reviewed:   June 04, 2021 ECG (independently read by me): SInus rhythm at 66; 1st degree AV block, PR 214 msec; QTc 467 msec.  May 15, 2020 ECG (independently read by me): Sinus bradycardia at 59; 1st degree AV block, PR interval 210 ms; QTC interval 435 ms.  November 2020 ECG (independently read by me): NSR at 75; First degree AV block PR 216 msec; QS V1-2.      October 2019 ECG (independently read by me): Sinus bradycardia at 56 bpm.  First-degree AV block with a PR interval of 210 ms.  Q waves inferiorly with preserved R waves  July 2018 ECG (independently read by me): Sinus bradycardia 53 bpm.  PR interval 192 ms.  QTc interval 457 ms.  Nonspecific T changes in lead 3.  April 2018 ECG (independently read by me): Sinus bradycardia 56 bpm.  Left axis deviation.  T-wave inversion in lead 3.  QS in V2.  QTc interval 420 ms.  PR interval 196 ms.  Recent Labs: BMP Latest Ref Rng & Units 05/06/2021 11/07/2020 12/31/2019  Glucose 70 - 99 mg/dL 171(H) 122(H) 232(H)  BUN 8 - 27 mg/dL '15 15 17  '$ Creatinine 0.57 - 1.00 mg/dL 0.69 0.70 0.82  BUN/Creat Ratio 12 - '28 22 21 21  '$ Sodium 134 - 144 mmol/L 136 138 136  Potassium 3.5 - 5.2 mmol/L 4.6 4.6 4.4  Chloride 96 - 106 mmol/L 99 99 100  CO2 20 - 29 mmol/L '21 21 22  '$ Calcium 8.7 - 10.3 mg/dL 10.5(H) 10.6(H) 10.5(H)     No flowsheet data found.  CBC Latest Ref Rng & Units 05/06/2021 11/07/2020 02/28/2018  WBC 3.4 - 10.8 x10E3/uL 11.2(H) 10.5 10.3  Hemoglobin 11.1 - 15.9 g/dL 12.9 12.3 13.5  Hematocrit 34.0 - 46.6 % 38.5 37.4 39.6  Platelets 150 - 450 x10E3/uL 291 246 275   Lab Results  Component Value Date   MCV 88 05/06/2021   MCV 86 11/07/2020   MCV 85 02/28/2018   No results found for: TSH No results found for:  HGBA1C   BNP No results found for: BNP  ProBNP No results found for: PROBNP   Lipid Panel  No results found for: CHOL, TRIG, HDL, CHOLHDL, VLDL, LDLCALC, LDLDIRECT   RADIOLOGY: No results found.   Additional studies/ records that were reviewed today include:  I had previously  read the office notes of Drs. [name] admission, and reviewed her diagnostic polysomnogram as well as CPAP titration trial.  I obtained a download of her CPAP unit in the office today for compliance and efficacy assessment.  In the office today I obtained a new download from July 1 through 10/25/2016.   ASSESSMENT:    1. OSA on CPAP   2. PAF (paroxysmal atrial fibrillation) ([place])   3. Primary hypertension   4. Morbid obesity ([place])   5. Bilateral lower extremity edema     PLAN:  Ms. Gara Kincade is a 76 year-old female who has a history of hypertension, and previous persistent atrial fibrillation which proved refractory to additional antiarrhythmics and prior cardioversions, but ultimately she was successfully cardioverted following a tikosyn load.  She was  found to have severe obstructive sleep apnea with an overall AHI of 31.2 per hour and very severe sleep apnea during REM sleep with an AHI of 58.3 per hour.  She had marked oxygen desaturation to a nadir of 69% on her diagnostic study.  When I saw her for her initial sleep evaluation she was meeting compliance but was sleeping an inadequate duration at only 4 hours and 30 minutes.  In prior evaluations I have had a lengthy discussion with her regarding the effects of obstructive sleep apnea on cardiovascular health and particularly with reference to its risk for atrial fibrillation and increased incidence of recurrence of atrial fibrillation if untreated.  Presently, she is sleeping well and continues to have 100% compliance with CPAP use.  She is sleeping with CPAP for 8 hours and 20 minutes on average.  AHI is excellent at 0.6.  She seems to like the  current old fullface mask that she is using.  She cleans it daily.  Her blood pressure today is elevated.  She also has significant bilateral lower extremity edema.  I have suggested that she she consider taking furosemide 40 mg every other day rather than just 1 time per week to see if this can improve both her blood pressure and lower extremity edema.  If edema significantly improves and blood pressure is better she may then try reducing this to every third day.  BMI is consistent with morbid obesity.  I discussed the importance of weight loss and exercise.  She is maintaining sinus rhythm with her optimal CPAP use as well as Tikosyn.  QTc interval today is 467 msec. Marland Kitchen  She is no longer taking Nexletol.  She has a follow-up appointment with Dr. Johnsie Cancel in May 2023.  I will see her in 1 year for follow-up evaluation..    Medication Adjustments/Labs and Tests Ordered: Current medicines are reviewed at length with the patient today.  Concerns regarding medicines are outlined above.  Medication changes, Labs and Tests ordered today are listed in the Patient Instructions below.  Patient Instructions  Medication Instructions:  TAKE YOUR FUROSEMIDE EVERY OTHER DAY FOR A WEEK IF YOU NOTICE IMPROVEMENT IN YOUR SWELLING AND YOUR BLOOD PRESSURE IS STABLE OK TO CHANGE TO EVERY THIRD DAY.   *If you need a refill on your cardiac medications before your next appointment, please call your pharmacy*  Lab Work: NONE  Testing/Procedures: NONE   Follow-Up: At [facility], you and your health needs are our priority.  As part of our continuing mission to provide you with exceptional heart care, we have created designated Provider Care Teams.  These Care Teams include your primary Cardiologist (physician) and Advanced Practice Providers (APPs -  Physician Assistants and Nurse Practitioners) who all work together to provide you with the care you need, when you need it.  We recommend signing up for the patient  portal called "MyChart".  Sign up information is  provided on this After Visit Summary.  MyChart is used to connect with patients for Virtual Visits (Telemedicine).  Patients are able to view lab/test results, encounter notes, upcoming appointments, etc.  Non-urgent messages can be sent to your provider as well.   To learn more about what you can do with MyChart, go to [email or web address].    Your next appointment:   12 month(s)  The format for your next appointment:   In Person  Provider:   DR Shelva Majestic    Signed, Shelva Majestic, MD  06/04/2021 5:37 PM    [facility] [address] [place], [place], [place]  [address] Phone: [phone]

## 2021-06-04 NOTE — Patient Instructions (Addendum)
Medication Instructions:  ?TAKE YOUR FUROSEMIDE EVERY OTHER DAY FOR A WEEK IF YOU NOTICE IMPROVEMENT IN YOUR SWELLING AND YOUR BLOOD PRESSURE IS STABLE OK TO CHANGE TO EVERY THIRD DAY.  ? ?*If you need a refill on your cardiac medications before your next appointment, please call your pharmacy* ? ?Lab Work: ?NONE ? ?Testing/Procedures: ?NONE  ? ?Follow-Up: ?At [facility], you and your health needs are our priority.  As part of our continuing mission to provide you with exceptional heart care, we have created designated Provider Care Teams.  These Care Teams include your primary Cardiologist (physician) and Advanced Practice Providers (APPs -  Physician Assistants and Nurse Practitioners) who all work together to provide you with the care you need, when you need it. ? ?We recommend signing up for the patient portal called "MyChart".  Sign up information is provided on this After Visit Summary.  MyChart is used to connect with patients for Virtual Visits (Telemedicine).  Patients are able to view lab/test results, encounter notes, upcoming appointments, etc.  Non-urgent messages can be sent to your provider as well.   ?To learn more about what you can do with MyChart, go to [email or web address].   ? ?Your next appointment:   ?12 month(s) ? ?The format for your next appointment:   ?In Person ? ?Provider:   ?DR Shelva Majestic ? ?

## 2021-06-05 ENCOUNTER — Other Ambulatory Visit: Payer: Self-pay | Admitting: Internal Medicine

## 2021-07-10 ENCOUNTER — Encounter (HOSPITAL_COMMUNITY): Payer: Self-pay

## 2021-07-10 ENCOUNTER — Encounter (HOSPITAL_COMMUNITY)
Admission: RE | Admit: 2021-07-10 | Discharge: 2021-07-10 | Disposition: A | Discharge status: Home / Self Care | Payer: Medicare Other | Source: Ambulatory Visit | Attending: Ophthalmology | Admitting: Ophthalmology

## 2021-07-10 ENCOUNTER — Other Ambulatory Visit: Payer: Self-pay

## 2021-07-10 HISTORY — DX: Unspecified osteoarthritis, unspecified site: M19.90

## 2021-07-13 DIAGNOSIS — M5137 Other intervertebral disc degeneration, lumbosacral region: Secondary | ICD-10-CM | POA: Diagnosis not present

## 2021-07-13 DIAGNOSIS — M9902 Segmental and somatic dysfunction of thoracic region: Secondary | ICD-10-CM | POA: Diagnosis not present

## 2021-07-13 DIAGNOSIS — M9904 Segmental and somatic dysfunction of sacral region: Secondary | ICD-10-CM | POA: Diagnosis not present

## 2021-07-13 DIAGNOSIS — H25812 Combined forms of age-related cataract, left eye: Secondary | ICD-10-CM | POA: Diagnosis not present

## 2021-07-13 DIAGNOSIS — M9903 Segmental and somatic dysfunction of lumbar region: Secondary | ICD-10-CM | POA: Diagnosis not present

## 2021-07-14 DIAGNOSIS — E1142 Type 2 diabetes mellitus with diabetic polyneuropathy: Secondary | ICD-10-CM | POA: Diagnosis not present

## 2021-07-14 DIAGNOSIS — L84 Corns and callosities: Secondary | ICD-10-CM | POA: Diagnosis not present

## 2021-07-14 DIAGNOSIS — M79676 Pain in unspecified toe(s): Secondary | ICD-10-CM | POA: Diagnosis not present

## 2021-07-14 DIAGNOSIS — B351 Tinea unguium: Secondary | ICD-10-CM | POA: Diagnosis not present

## 2021-07-16 NOTE — H&P (Signed)
Surgical History & Physical ? ?Patient Name: Dominique Bauer DOB: March 22, 1946 ? ?Surgery: Cataract extraction with intraocular lens implant phacoemulsification; Left Eye ? ?Surgeon: [name] MD ?Surgery Date:  07-20-21 ?Pre-Op Date:  07-13-21 ? ?HPI: ?A 44 Yr. old female patient Pt is referred by Dr Jorja Loa for cataract eval. 1. 1. The patient complains of difficulty when recognizing people/driving at night, which began many years ago. Both eyes are affected. The episode is gradual. The condition's severity increased since last visit. Symptoms occur when the patient is driving, inside and outside. Pt also c/o diff to focus. This is negatively affecting the patient's quality of life and the patient is unable to function adequately in life with the current level of vision. HPI was performed by [name] . ? ?Medical History: ?Cataracts ?DVT, Sleep Apnea ?Diabetes ?Heart Problem ?Lung Problems ? ?Review of Systems ?Allergic/Immunologic Seasonal Allergies ?Respiratory Asthma ?All recorded systems are negative except as noted above. ? ?Social ?  Never smoked  ? ?Medication ?Breo Ellipta, Eliquis, ASA, Benazepril/HCTZ, Fish Oil, Glimeperide, Januvia, L-lysine, Magnesium, Tikosyn, Zyrtec, Ocuvite Eye Plus,  ? ?Sx/Procedures ?Hysterectomy, Knee Arthroscopy, Appendectomy, Ear Sx left, Nasal septum,  ? ?Drug Allergies  ?Penicilin, Neosporin (neo-bac-polym),  ? ?History & Physical: ?Heent: Cataract, Left Eye ?NECK: supple without bruits ?LUNGS: lungs clear to auscultation ?CV: regular rate and rhythm ?Abdomen: soft and non-tender ? ?Impression & Plan: ?Assessment: ?1.  COMBINED FORMS AGE RELATED CATARACT; Both Eyes (H25.813) ?2.  Diabetes Type 2 No retinopathy (E11.9) ?3.  TRICHIASIS NO ENTROPION; Left Lower Lid (H02.055) ?4.  OAG BORDERLINE FINDINGS HIGH RISK; Both Eyes (H40.023) ?5.  ASTIGMATISM, REGULAR; Both Eyes (H52.223) ?6.  DRUSEN; Both Eyes (H35.363) ? ?Plan: 1.  Cataract accounts for the patient's decreased  vision. This visual impairment is not correctable with a tolerable change in glasses or contact lenses. Cataract surgery with an implantation of a new lens should significantly improve the visual and functional status of the patient. Discussed all risks, benefits, alternatives, and potential complications. Discussed the procedures and recovery. Patient desires to have surgery. A-scan ordered and performed today for intra-ocular lens calculations. The surgery will be performed in order to improve vision for driving, reading, and for eye examinations. Recommend phacoemulsification with intra-ocular lens. Recommend Dextenza for post-operative pain and inflammation. ?Left Eye worse - first.. ?Dilates well - shugarcaine by protocol. ?Toric IOL - Eyhance or Vivity Toric Lens. ? ?2.  Stressed importance of blood sugar and blood pressure control, and also yearly eye examinations. ?Discussed the need for ongoing proactive ocular exams and treatment, hopefully before visual symptoms develop. ? ?3.  Lashes removed with jeweler's forceps. No complications. ? ?4.  Based on cup-to-disc ratio. ?Negative Family history. ?OCT rNFL today shows: thinning OU. ?IOPs excellent today. ?Detailed discussion about glaucoma today including importance of maintaining good follow up and following treatment plan, and the possibility of irreversible blindness as part of this disease process. ? ?5.  Toric IOL as above. ? ?6.  Very fine drusen in macula. ?OCT macula is essentially normal with no signs of ARMD. ?

## 2021-07-20 ENCOUNTER — Encounter (HOSPITAL_COMMUNITY)
Admission: RE | Disposition: A | Discharge status: Home / Self Care | Payer: Self-pay | Source: Home / Self Care | Attending: Ophthalmology

## 2021-07-20 ENCOUNTER — Ambulatory Visit (HOSPITAL_COMMUNITY): Payer: Medicare Other | Admitting: Anesthesiology

## 2021-07-20 ENCOUNTER — Ambulatory Visit (HOSPITAL_BASED_OUTPATIENT_CLINIC_OR_DEPARTMENT_OTHER): Payer: Medicare Other | Admitting: Anesthesiology

## 2021-07-20 ENCOUNTER — Ambulatory Visit (HOSPITAL_COMMUNITY)
Admission: RE | Admit: 2021-07-20 | Discharge: 2021-07-20 | Disposition: A | Discharge status: Home / Self Care | Payer: Medicare Other | Attending: Ophthalmology | Admitting: Ophthalmology

## 2021-07-20 ENCOUNTER — Encounter (HOSPITAL_COMMUNITY): Payer: Self-pay | Admitting: Ophthalmology

## 2021-07-20 DIAGNOSIS — H25812 Combined forms of age-related cataract, left eye: Secondary | ICD-10-CM | POA: Diagnosis not present

## 2021-07-20 DIAGNOSIS — Z7901 Long term (current) use of anticoagulants: Secondary | ICD-10-CM | POA: Insufficient documentation

## 2021-07-20 DIAGNOSIS — I10 Essential (primary) hypertension: Secondary | ICD-10-CM

## 2021-07-20 DIAGNOSIS — I4891 Unspecified atrial fibrillation: Secondary | ICD-10-CM | POA: Diagnosis not present

## 2021-07-20 DIAGNOSIS — E1136 Type 2 diabetes mellitus with diabetic cataract: Secondary | ICD-10-CM | POA: Diagnosis not present

## 2021-07-20 DIAGNOSIS — M1990 Unspecified osteoarthritis, unspecified site: Secondary | ICD-10-CM | POA: Insufficient documentation

## 2021-07-20 DIAGNOSIS — Z7722 Contact with and (suspected) exposure to environmental tobacco smoke (acute) (chronic): Secondary | ICD-10-CM | POA: Diagnosis not present

## 2021-07-20 DIAGNOSIS — H52202 Unspecified astigmatism, left eye: Secondary | ICD-10-CM | POA: Insufficient documentation

## 2021-07-20 DIAGNOSIS — Z79899 Other long term (current) drug therapy: Secondary | ICD-10-CM | POA: Insufficient documentation

## 2021-07-20 DIAGNOSIS — Z6841 Body Mass Index (BMI) 40.0 and over, adult: Secondary | ICD-10-CM | POA: Insufficient documentation

## 2021-07-20 DIAGNOSIS — H52222 Regular astigmatism, left eye: Secondary | ICD-10-CM | POA: Diagnosis not present

## 2021-07-20 DIAGNOSIS — Z86718 Personal history of other venous thrombosis and embolism: Secondary | ICD-10-CM | POA: Insufficient documentation

## 2021-07-20 DIAGNOSIS — G4730 Sleep apnea, unspecified: Secondary | ICD-10-CM | POA: Insufficient documentation

## 2021-07-20 DIAGNOSIS — I4819 Other persistent atrial fibrillation: Secondary | ICD-10-CM | POA: Diagnosis not present

## 2021-07-20 HISTORY — PX: CATARACT EXTRACTION W/PHACO: SHX586

## 2021-07-20 LAB — GLUCOSE, CAPILLARY: Glucose-Capillary: 187 mg/dL — ABNORMAL HIGH (ref 70–99)

## 2021-07-20 SURGERY — PHACOEMULSIFICATION, CATARACT, WITH IOL INSERTION
Anesthesia: Monitor Anesthesia Care | Site: Eye | Laterality: Left

## 2021-07-20 MED ORDER — POVIDONE-IODINE 5 % OP SOLN
OPHTHALMIC | Status: DC | PRN
  Administered 2021-07-20: 1 via OPHTHALMIC

## 2021-07-20 MED ORDER — EPINEPHRINE PF 1 MG/ML IJ SOLN
INTRAOCULAR | Status: DC | PRN
  Administered 2021-07-20: 500 mL

## 2021-07-20 MED ORDER — SODIUM HYALURONATE 10 MG/ML IO SOLUTION
PREFILLED_SYRINGE | INTRAOCULAR | Status: DC | PRN
  Administered 2021-07-20: 0.85 mL via INTRAOCULAR

## 2021-07-20 MED ORDER — TETRACAINE HCL 0.5 % OP SOLN
1.0000 [drp] | OPHTHALMIC | Status: AC | PRN
  Administered 2021-07-20 (×3): 1 [drp] via OPHTHALMIC

## 2021-07-20 MED ORDER — BSS IO SOLN
INTRAOCULAR | Status: DC | PRN
Start: 2021-07-20 — End: 2021-07-20
  Administered 2021-07-20: 15 mL via INTRAOCULAR

## 2021-07-20 MED ORDER — STERILE WATER FOR IRRIGATION IR SOLN
Status: DC | PRN
  Administered 2021-07-20: 500 mL

## 2021-07-20 MED ORDER — EPINEPHRINE PF 1 MG/ML IJ SOLN
INTRAMUSCULAR | Status: AC
  Filled 2021-07-20: qty 2

## 2021-07-20 MED ORDER — TROPICAMIDE 1 % OP SOLN
1.0000 [drp] | OPHTHALMIC | Status: AC | PRN
  Administered 2021-07-20 (×3): 1 [drp] via OPHTHALMIC

## 2021-07-20 MED ORDER — SODIUM HYALURONATE 23MG/ML IO SOSY
PREFILLED_SYRINGE | INTRAOCULAR | Status: DC | PRN
Start: 2021-07-20 — End: 2021-07-20
  Administered 2021-07-20: 0.6 mL via INTRAOCULAR

## 2021-07-20 MED ORDER — LIDOCAINE HCL 3.5 % OP GEL
1.0000 "application " | Freq: Once | OPHTHALMIC | Status: AC
  Administered 2021-07-20: 1 via OPHTHALMIC

## 2021-07-20 MED ORDER — LIDOCAINE HCL (PF) 1 % IJ SOLN
INTRAOCULAR | Status: DC | PRN
  Administered 2021-07-20: 1 mL via OPHTHALMIC

## 2021-07-20 MED ORDER — PHENYLEPHRINE HCL 2.5 % OP SOLN
1.0000 [drp] | OPHTHALMIC | Status: AC | PRN
  Administered 2021-07-20 (×3): 1 [drp] via OPHTHALMIC

## 2021-07-20 SURGICAL SUPPLY — 15 items
CATARACT SUITE SIGHTPATH (MISCELLANEOUS) ×2 IMPLANT
CLOTH BEACON ORANGE TIMEOUT ST (SAFETY) ×2 IMPLANT
EYE SHIELD UNIVERSAL CLEAR (GAUZE/BANDAGES/DRESSINGS) ×1 IMPLANT
FEE CATARACT SUITE SIGHTPATH (MISCELLANEOUS) ×1 IMPLANT
GLOVE BIOGEL PI IND STRL 7.0 (GLOVE) ×2 IMPLANT
GLOVE BIOGEL PI INDICATOR 7.0 (GLOVE) ×2
LENS IOL EYHANCE TRC 150 20.0 IMPLANT
LENS IOL TORIC DIU150 20.0 ×2 IMPLANT
NDL HYPO 18GX1.5 BLUNT FILL (NEEDLE) ×1 IMPLANT
NEEDLE HYPO 18GX1.5 BLUNT FILL (NEEDLE) ×2 IMPLANT
PAD ARMBOARD 7.5X6 YLW CONV (MISCELLANEOUS) ×2 IMPLANT
SYR TB 1ML LL NO SAFETY (SYRINGE) ×2 IMPLANT
TAPE SURG TRANSPORE 1 IN (GAUZE/BANDAGES/DRESSINGS) IMPLANT
TAPE SURGICAL TRANSPORE 1 IN (GAUZE/BANDAGES/DRESSINGS) ×2
WATER STERILE IRR 250ML POUR (IV SOLUTION) ×2 IMPLANT

## 2021-07-20 NOTE — Anesthesia Preprocedure Evaluation (Signed)
Anesthesia Evaluation  ?Patient identified by MRN, date of birth, ID band ?Patient awake ? ? ? ?Reviewed: ?Allergy & Precautions, NPO status , Patient's Chart, lab work & pertinent test results ? ?Airway ?Mallampati: II ? ?TM Distance: >3 FB ?Neck ROM: Full ? ? ? Dental ? ?(+) Dental Advisory Given ?Bridges :   ?Pulmonary ?sleep apnea and Continuous Positive Airway Pressure Ventilation ,  ?  ?Pulmonary exam normal ?breath sounds clear to auscultation ? ? ? ? ? ? Cardiovascular ?hypertension, Pt. on medications ?+ dysrhythmias Atrial Fibrillation  ?Rhythm:Regular Rate:Normal ? ?1. Left ventricular ejection fraction, by estimation, is 60 to 65%. The left ventricle has normal function. The left ventricle has no regional wall motion abnormalities. The left ventricular internal cavity size was  ?mildly dilated. Left ventricular  ?diastolic parameters are consistent with Grade I diastolic dysfunction (impaired relaxation).  ??2. Right ventricular systolic function is normal. The right ventricular size is normal. Tricuspid regurgitation signal is inadequate for assessing PA pressure.  ??3. Left atrial size was moderately dilated.  ??4. Right atrial size was mildly dilated.  ??5. The mitral valve is normal in structure. No evidence of mitral valve regurgitation. No evidence of mitral stenosis.  ??6. The aortic valve is tricuspid. Aortic valve regurgitation is not visualized. No aortic stenosis is present.  ?  ?Neuro/Psych ? Neuromuscular disease negative psych ROS  ? GI/Hepatic ?negative GI ROS, Neg liver ROS,   ?Endo/Other  ?Morbid obesity ? Renal/GU ?negative Renal ROS  ?negative genitourinary ?  ?Musculoskeletal ? ?(+) Arthritis , Osteoarthritis,   ? Abdominal ?  ?Peds ?negative pediatric ROS ?(+)  Hematology ?negative hematology ROS ?(+)   ?Anesthesia Other Findings ? ? Reproductive/Obstetrics ?negative OB ROS ? ?  ? ? ? ? ? ? ? ? ? ? ? ? ? ?  ?  ? ? ? ? ? ? ? ? ?Anesthesia  Physical ? ?Anesthesia Plan ? ?ASA: 3 ? ?Anesthesia Plan: MAC  ? ?Post-op Pain Management: Minimal or no pain anticipated  ? ?Induction:  ? ?PONV Risk Score and Plan:  ? ?Airway Management Planned: Nasal Cannula and Natural Airway ? ?Additional Equipment:  ? ?Intra-op Plan:  ? ?Post-operative Plan:  ? ?Informed Consent: I have reviewed the patients History and Physical, chart, labs and discussed the procedure including the risks, benefits and alternatives for the proposed anesthesia with the patient or authorized representative who has indicated his/her understanding and acceptance.  ? ? ? ?Dental advisory given ? ?Plan Discussed with: CRNA and Surgeon ? ?Anesthesia Plan Comments:   ? ? ? ? ? ? ?Anesthesia Quick Evaluation ? ?

## 2021-07-20 NOTE — Discharge Instructions (Signed)
Please discharge patient when stable, will follow up today with Dr. Jaloni Sorber at the [facility] [place] office immediately following discharge.  Leave shield in place until visit.  All paperwork with discharge instructions will be given at the office.  [facility] Address:  [address]  [place], [place] [address]  

## 2021-07-20 NOTE — Anesthesia Postprocedure Evaluation (Signed)
Anesthesia Post Note ? ?Patient: Deniece Rankin ? ?Procedure(s) Performed: CATARACT EXTRACTION PHACO AND INTRAOCULAR LENS PLACEMENT (IOC) (Left: Eye) ? ?Patient location during evaluation: Phase II ?Anesthesia Type: MAC ?Level of consciousness: awake and alert and oriented ?Pain management: pain level controlled ?Vital Signs Assessment: post-procedure vital signs reviewed and stable ?Respiratory status: spontaneous breathing, nonlabored ventilation and respiratory function stable ?Cardiovascular status: blood pressure returned to baseline and stable ?Postop Assessment: no apparent nausea or vomiting ?Anesthetic complications: no ? ? ?No notable events documented. ? ? ?Last Vitals:  ?Vitals:  ? 07/20/21 1018 07/20/21 1121  ?BP: (!) 152/72 (!) 147/82  ?Pulse: 89 84  ?Resp: (!) 24 18  ?Temp: 36.8 ?C 36.4 ?C  ?SpO2: 96% 97%  ?  ?Last Pain:  ?Vitals:  ? 07/20/21 1121  ?TempSrc: Axillary  ?PainSc: 0-No pain  ? ? ?  ?  ?  ?  ?  ?  ? ?Jaiquan Temme C Erven Ramson ? ? ? ? ?

## 2021-07-20 NOTE — Op Note (Addendum)
Date of procedure: 07/20/21 ? ?Pre-operative diagnosis: Visually significant age-related cataract, Left Eye; Visually Significant Astigmatism, Left Eye (H25.?2) ? ?Post-operative diagnosis: Visually significant age-related cataract, Left Eye; Visually Significant Astigmatism, Left Eye ? ?Procedure: Removal of cataract via phacoemulsification and insertion of intra-ocular lens Wynetta Emery and Johnson DIU150 +20.0D into the capsular bag of the Left Eye ? ?Attending surgeon: Gerda Diss. Marisa Hua, MD, MA ? ?Anesthesia: MAC, Topical Akten ? ?Complications: None ? ?Estimated Blood Loss: <24m (minimal) ? ?Specimens: None ? ?Implants: As above ? ?Indications:  Visually significant age-related cataract, Left Eye; Visually Significant Astigmatism, Left Eye ? ?Procedure:  ?The patient was seen and identified in the pre-operative area. The operative eye was identified and dilated.  The operative eye was marked.  Pre-operative toric markers were used to mark the eye at 0 and 180 degrees. Topical anesthesia was administered to the operative eye.    ? ?The patient was then to the operative suite and placed in the supine position.  A timeout was performed confirming the patient, procedure to be performed, and all other relevant information.   The patient's face was prepped and draped in the usual fashion for intra-ocular surgery.  A lid speculum was placed into the operative eye and the surgical microscope moved into place and focused.  A superotemporal paracentesis was created using a 20 gauge paracentesis blade.  Shugarcaine was injected into the anterior chamber.  Viscoelastic was injected into the anterior chamber.  A temporal clear-corneal main wound incision was created using a 2.472mmicrokeratome.  A continuous curvilinear capsulorrhexis was initiated using an irrigating cystitome and completed using capsulorrhexis forceps.  Hydrodissection and hydrodeliniation were performed.  Viscoelastic was injected into the anterior chamber.  A  phacoemulsification handpiece and a chopper as a second instrument were used to remove the nucleus and epinucleus. The irrigation/aspiration handpiece was used to remove any remaining cortical material.  ? ?The capsular bag was reinflated with viscoelastic, checked, and found to be intact.  The eye was marked to the per-op meridian.  The intraocular lens was inserted into the capsular bag and dialed into place using a Kuglen hook to 111 degrees.  The irrigation/aspiration handpiece was used to remove any remaining viscoelastic.  The clear corneal wound and paracentesis wounds were then hydrated and checked with Weck-Cels to be watertight.  The lid-speculum and drape was removed, and the patient's face was cleaned with a wet and dry 4x4. A clear shield was taped over the eye. The patient was taken to the post-operative care unit in good condition, having tolerated the procedure well. ? ?Post-Op Instructions: [facility] and will receive all other orders and instructions. ? ?

## 2021-07-20 NOTE — Interval H&P Note (Signed)
History and Physical Interval Note: ? ?07/20/2021 ?10:54 AM ? ?Dominique Bauer  has presented today for surgery, with the diagnosis of combined forms age related cataract; left.  The various methods of treatment have been discussed with the patient and family. After consideration of risks, benefits and other options for treatment, the patient has consented to  Procedure(s) with comments: ?CATARACT EXTRACTION PHACO AND INTRAOCULAR LENS PLACEMENT (IOC) (Left) - CDE:  as a surgical intervention.  The patient's history has been reviewed, patient examined, no change in status, stable for surgery.  I have reviewed the patient's chart and labs.  Questions were answered to the patient's satisfaction.   ? ? ?[name] ? ? ?

## 2021-07-20 NOTE — Transfer of Care (Signed)
Immediate Anesthesia Transfer of Care Note ? ?Patient: Dominique Bauer ? ?Procedure(s) Performed: CATARACT EXTRACTION PHACO AND INTRAOCULAR LENS PLACEMENT (IOC) (Left: Eye) ? ?Patient Location: PACU ? ?Anesthesia Type:MAC ? ?Level of Consciousness: awake, alert  and oriented ? ?Airway & Oxygen Therapy: Patient Spontanous Breathing ? ?Post-op Assessment: Report given to RN, Post -op Vital signs reviewed and stable, Patient moving all extremities X 4 and Patient able to stick tongue midline ? ?Post vital signs: Reviewed ? ?Last Vitals:  ?Vitals Value Taken Time  ?BP    ?Temp    ?Pulse    ?Resp    ?SpO2    ? ? ?Last Pain:  ?Vitals:  ? 07/20/21 1018  ?TempSrc: Oral  ?PainSc: 0-No pain  ?   ? ?  ? ?Complications: No notable events documented. ?

## 2021-07-21 ENCOUNTER — Encounter (HOSPITAL_COMMUNITY): Payer: Self-pay | Admitting: Ophthalmology

## 2021-07-27 DIAGNOSIS — H25811 Combined forms of age-related cataract, right eye: Secondary | ICD-10-CM | POA: Diagnosis not present

## 2021-07-27 NOTE — H&P (Signed)
Surgical History & Physical ? ?Patient Name: Dominique Bauer DOB: [birth year] ? ?Surgery: Cataract extraction with intraocular lens implant phacoemulsification; Right Eye ? ?Surgeon: [name] MD ?Surgery Date:  08-03-21 ?Pre-Op Date:  07-27-21 ? ?HPI: ?A 58 Yr. old female patient pt presents for 1 week PO OS after cataract sx. pt states that since last visit, she has been doing well. pt has noticed improvement in [place] and is happy with how much brightness she is getting now. pt has noticing some constant stinging in the eye. pt has remained compliant with drops as well. Patient is also here for evaluation of a cataract in the right eye. Patient states that vision is still quite blurry in the right eye. She has trouble reading in low light. The vision is also very different between the two eyes. This is negatively affecting the patient's quality of life and the patient is unable to function adequately in life with the current level of vision. HPI was performed by [name] . ? ?Medical History: ?Cataracts ?DVT, Sleep Apnea ?Diabetes ?Heart Problem ?Lung Problems ? ?Review of Systems ?Allergic/Immunologic Seasonal Allergies ?Respiratory Asthma ?All recorded systems are negative except as noted above. ? ?Social ?  Never smoked  ? ?Medication ?Prednisolone-Moxifloxacin-Bromfenac,  ?Breo Ellipta, Eliquis, ASA, Benazepril/HCTZ, Fish Oil, Glimeperide, Januvia, L-lysine, Magnesium, Tikosyn, Zyrtec, Ocuvite Eye Plus,  ? ?Sx/Procedures ?Phaco c IOL OS-Toric,  ?Hysterectomy, Knee Arthroscopy, Appendectomy, Ear Sx left, Nasal septum,  ? ?Drug Allergies  ?Penicilin, Neosporin (neo-bac-polym),  ? ?History & Physical: ?Heent: Cataract, Right Eye ?NECK: supple without bruits ?LUNGS: lungs clear to auscultation ?CV: regular rate and rhythm ?Abdomen: soft and non-tender ? ?Impression & Plan: ?Assessment: ?1.  CATARACT EXTRACTION STATUS; Left Eye [phone]) ?2.  COMBINED FORMS AGE RELATED CATARACT; , Right Eye (H25.811) ?3.   Myopia ; Left Eye (H52.12) ?4.  NUCLEAR SCLEROSIS AGE RELATED; Right Eye (H25.11) ? ?Plan: 1.  1 week after cataract surgery. Doing well with improved vision and normal eye pressure. Call with any problems or concerns. ?Continue Pred-Moxi-Brom 2x/day for 3 more weeks. ? ?2.  Cataract accounts for the patient's decreased vision. This visual impairment is not correctable with a tolerable change in glasses or contact lenses. Cataract surgery with an implantation of a new lens should significantly improve the visual and functional status of the patient. Discussed all risks, benefits, alternatives, and potential complications. Discussed the procedures and recovery. Patient desires to have surgery. A-scan ordered and performed today for intra-ocular lens calculations. The surgery will be performed in order to improve vision for driving, reading, and for eye examinations. Recommend phacoemulsification with intra-ocular lens. Recommend Dextenza for post-operative pain and inflammation. ?Right Eye. ?Surgery required to correct imbalance of vision. ?Dilates well - shugarcaine by protocol. ?Toric IOL. ? ?3.  ? ?4.  ?

## 2021-07-28 DIAGNOSIS — G4733 Obstructive sleep apnea (adult) (pediatric): Secondary | ICD-10-CM | POA: Diagnosis not present

## 2021-07-29 ENCOUNTER — Other Ambulatory Visit: Payer: Self-pay

## 2021-07-29 ENCOUNTER — Encounter (HOSPITAL_COMMUNITY)
Admission: RE | Admit: 2021-07-29 | Discharge: 2021-07-29 | Disposition: A | Discharge status: Home / Self Care | Payer: Medicare Other | Source: Ambulatory Visit | Attending: Ophthalmology | Admitting: Ophthalmology

## 2021-07-29 ENCOUNTER — Encounter (HOSPITAL_COMMUNITY): Payer: Self-pay

## 2021-08-03 ENCOUNTER — Encounter (HOSPITAL_COMMUNITY): Payer: Self-pay | Admitting: Ophthalmology

## 2021-08-03 ENCOUNTER — Ambulatory Visit (HOSPITAL_COMMUNITY)
Admission: RE | Admit: 2021-08-03 | Discharge: 2021-08-03 | Disposition: A | Discharge status: Home / Self Care | Payer: Medicare Other | Attending: Ophthalmology | Admitting: Ophthalmology

## 2021-08-03 ENCOUNTER — Ambulatory Visit (HOSPITAL_COMMUNITY): Payer: Medicare Other | Admitting: Certified Registered Nurse Anesthetist

## 2021-08-03 ENCOUNTER — Ambulatory Visit (HOSPITAL_BASED_OUTPATIENT_CLINIC_OR_DEPARTMENT_OTHER): Payer: Medicare Other | Admitting: Certified Registered Nurse Anesthetist

## 2021-08-03 ENCOUNTER — Encounter (HOSPITAL_COMMUNITY)
Admission: RE | Disposition: A | Discharge status: Home / Self Care | Payer: Self-pay | Source: Home / Self Care | Attending: Ophthalmology

## 2021-08-03 DIAGNOSIS — H52201 Unspecified astigmatism, right eye: Secondary | ICD-10-CM | POA: Insufficient documentation

## 2021-08-03 DIAGNOSIS — H52221 Regular astigmatism, right eye: Secondary | ICD-10-CM | POA: Diagnosis not present

## 2021-08-03 DIAGNOSIS — Z6841 Body Mass Index (BMI) 40.0 and over, adult: Secondary | ICD-10-CM | POA: Insufficient documentation

## 2021-08-03 DIAGNOSIS — E1136 Type 2 diabetes mellitus with diabetic cataract: Secondary | ICD-10-CM | POA: Diagnosis not present

## 2021-08-03 DIAGNOSIS — I4891 Unspecified atrial fibrillation: Secondary | ICD-10-CM | POA: Insufficient documentation

## 2021-08-03 DIAGNOSIS — Z7984 Long term (current) use of oral hypoglycemic drugs: Secondary | ICD-10-CM | POA: Insufficient documentation

## 2021-08-03 DIAGNOSIS — H25811 Combined forms of age-related cataract, right eye: Secondary | ICD-10-CM | POA: Insufficient documentation

## 2021-08-03 DIAGNOSIS — G4730 Sleep apnea, unspecified: Secondary | ICD-10-CM | POA: Diagnosis not present

## 2021-08-03 DIAGNOSIS — I10 Essential (primary) hypertension: Secondary | ICD-10-CM

## 2021-08-03 DIAGNOSIS — Z9842 Cataract extraction status, left eye: Secondary | ICD-10-CM | POA: Diagnosis not present

## 2021-08-03 DIAGNOSIS — H5212 Myopia, left eye: Secondary | ICD-10-CM | POA: Diagnosis not present

## 2021-08-03 DIAGNOSIS — Z79899 Other long term (current) drug therapy: Secondary | ICD-10-CM | POA: Insufficient documentation

## 2021-08-03 DIAGNOSIS — H2511 Age-related nuclear cataract, right eye: Secondary | ICD-10-CM | POA: Diagnosis not present

## 2021-08-03 HISTORY — PX: CATARACT EXTRACTION W/PHACO: SHX586

## 2021-08-03 LAB — GLUCOSE, CAPILLARY: Glucose-Capillary: 182 mg/dL — ABNORMAL HIGH (ref 70–99)

## 2021-08-03 SURGERY — PHACOEMULSIFICATION, CATARACT, WITH IOL INSERTION
Anesthesia: Monitor Anesthesia Care | Site: Eye | Laterality: Right

## 2021-08-03 MED ORDER — SODIUM HYALURONATE 23MG/ML IO SOSY
PREFILLED_SYRINGE | INTRAOCULAR | Status: DC | PRN
Start: 2021-08-03 — End: 2021-08-03
  Administered 2021-08-03: 0.6 mL via INTRAOCULAR

## 2021-08-03 MED ORDER — TETRACAINE HCL 0.5 % OP SOLN
1.0000 [drp] | OPHTHALMIC | Status: AC | PRN
  Administered 2021-08-03 (×3): 1 [drp] via OPHTHALMIC

## 2021-08-03 MED ORDER — EPINEPHRINE PF 1 MG/ML IJ SOLN
INTRAMUSCULAR | Status: AC
  Filled 2021-08-03: qty 2

## 2021-08-03 MED ORDER — POVIDONE-IODINE 5 % OP SOLN
OPHTHALMIC | Status: DC | PRN
  Administered 2021-08-03: 1 via OPHTHALMIC

## 2021-08-03 MED ORDER — STERILE WATER FOR IRRIGATION IR SOLN
Status: DC | PRN
Start: 2021-08-03 — End: 2021-08-03
  Administered 2021-08-03: 250 mL

## 2021-08-03 MED ORDER — PHENYLEPHRINE HCL 2.5 % OP SOLN
1.0000 [drp] | OPHTHALMIC | Status: AC | PRN
  Administered 2021-08-03 (×3): 1 [drp] via OPHTHALMIC

## 2021-08-03 MED ORDER — SODIUM HYALURONATE 10 MG/ML IO SOLUTION
PREFILLED_SYRINGE | INTRAOCULAR | Status: DC | PRN
  Administered 2021-08-03: 0.85 mL via INTRAOCULAR

## 2021-08-03 MED ORDER — EPINEPHRINE PF 1 MG/ML IJ SOLN
INTRAMUSCULAR | Status: DC | PRN
  Administered 2021-08-03: 500 mL

## 2021-08-03 MED ORDER — TROPICAMIDE 1 % OP SOLN
1.0000 [drp] | OPHTHALMIC | Status: AC | PRN
  Administered 2021-08-03 (×3): 1 [drp] via OPHTHALMIC

## 2021-08-03 MED ORDER — LIDOCAINE HCL 3.5 % OP GEL
1.0000 "application " | Freq: Once | OPHTHALMIC | Status: AC
  Administered 2021-08-03: 1 via OPHTHALMIC

## 2021-08-03 MED ORDER — BSS IO SOLN
INTRAOCULAR | Status: DC | PRN
  Administered 2021-08-03: 15 mL via INTRAOCULAR

## 2021-08-03 MED ORDER — EPINEPHRINE PF 1 MG/ML IJ SOLN
INTRAOCULAR | Status: DC | PRN
  Administered 2021-08-03: 1 mL via OPHTHALMIC

## 2021-08-03 SURGICAL SUPPLY — 17 items
CATARACT SUITE SIGHTPATH (MISCELLANEOUS) ×2 IMPLANT
CLOTH BEACON ORANGE TIMEOUT ST (SAFETY) ×2 IMPLANT
EYE SHIELD UNIVERSAL CLEAR (GAUZE/BANDAGES/DRESSINGS) ×1 IMPLANT
FEE CATARACT SUITE SIGHTPATH (MISCELLANEOUS) ×1 IMPLANT
GLOVE BIOGEL PI IND STRL 7.0 (GLOVE) ×2 IMPLANT
GLOVE BIOGEL PI INDICATOR 7.0 (GLOVE) ×2
LENS IOL EYHANCE TORIC II 20.5 ×2 IMPLANT
LENS IOL EYHANCE TRC 150 20.5 IMPLANT
LENS IOL EYHNC TORIC 150 20.5 ×1 IMPLANT
PAD ARMBOARD 7.5X6 YLW CONV (MISCELLANEOUS) ×2 IMPLANT
PROC W SPEC LENS (INTRAOCULAR LENS)
PROCESS W SPEC LENS (INTRAOCULAR LENS) IMPLANT
RING MALYGIN 7.0 (MISCELLANEOUS) IMPLANT
SYR TB 1ML LL NO SAFETY (SYRINGE) ×2 IMPLANT
TAPE SURG TRANSPORE 1 IN (GAUZE/BANDAGES/DRESSINGS) IMPLANT
TAPE SURGICAL TRANSPORE 1 IN (GAUZE/BANDAGES/DRESSINGS) ×2
WATER STERILE IRR 250ML POUR (IV SOLUTION) ×2 IMPLANT

## 2021-08-03 NOTE — Discharge Instructions (Addendum)
Please discharge patient when stable, will follow up today with Dr. Wrzosek at the [facility] office immediately following discharge.  Leave shield in place until visit.  All paperwork with discharge instructions will be given at the office.  [facility] Address:  [address]  [place], [place] [address]  

## 2021-08-03 NOTE — Anesthesia Preprocedure Evaluation (Signed)
Anesthesia Evaluation  ?Patient identified by MRN, date of birth, ID band ?Patient awake ? ? ? ?Reviewed: ?Allergy & Precautions, NPO status , Patient's Chart, lab work & pertinent test results ? ?Airway ?Mallampati: II ? ?TM Distance: >3 FB ?Neck ROM: Full ? ? ? Dental ? ?(+) Dental Advisory Given ?Bridges :   ?Pulmonary ?sleep apnea and Continuous Positive Airway Pressure Ventilation ,  ?  ?Pulmonary exam normal ?breath sounds clear to auscultation ? ? ? ? ? ? Cardiovascular ?hypertension, Pt. on medications ?+ dysrhythmias Atrial Fibrillation  ?Rhythm:Regular Rate:Normal ? ?1. Left ventricular ejection fraction, by estimation, is 60 to 65%. The left ventricle has normal function. The left ventricle has no regional wall motion abnormalities. The left ventricular internal cavity size was  ?mildly dilated. Left ventricular  ?diastolic parameters are consistent with Grade I diastolic dysfunction (impaired relaxation).  ??2. Right ventricular systolic function is normal. The right ventricular size is normal. Tricuspid regurgitation signal is inadequate for assessing PA pressure.  ??3. Left atrial size was moderately dilated.  ??4. Right atrial size was mildly dilated.  ??5. The mitral valve is normal in structure. No evidence of mitral valve regurgitation. No evidence of mitral stenosis.  ??6. The aortic valve is tricuspid. Aortic valve regurgitation is not visualized. No aortic stenosis is present.  ?  ?Neuro/Psych ? Neuromuscular disease negative psych ROS  ? GI/Hepatic ?negative GI ROS, Neg liver ROS,   ?Endo/Other  ?Morbid obesity ? Renal/GU ?negative Renal ROS  ?negative genitourinary ?  ?Musculoskeletal ? ?(+) Arthritis , Osteoarthritis,   ? Abdominal ?  ?Peds ?negative pediatric ROS ?(+)  Hematology ?negative hematology ROS ?(+)   ?Anesthesia Other Findings ? ? Reproductive/Obstetrics ?negative OB ROS ? ?  ? ? ? ? ? ? ? ? ? ? ? ? ? ?  ?  ? ? ? ? ? ? ? ? ?Anesthesia  Physical ? ?Anesthesia Plan ? ?ASA: 3 ? ?Anesthesia Plan: MAC  ? ?Post-op Pain Management: Minimal or no pain anticipated  ? ?Induction:  ? ?PONV Risk Score and Plan:  ? ?Airway Management Planned: Nasal Cannula and Natural Airway ? ?Additional Equipment:  ? ?Intra-op Plan:  ? ?Post-operative Plan:  ? ?Informed Consent: I have reviewed the patients History and Physical, chart, labs and discussed the procedure including the risks, benefits and alternatives for the proposed anesthesia with the patient or authorized representative who has indicated his/her understanding and acceptance.  ? ? ? ?Dental advisory given ? ?Plan Discussed with: CRNA and Surgeon ? ?Anesthesia Plan Comments:   ? ? ? ? ? ? ?Anesthesia Quick Evaluation ? ?

## 2021-08-03 NOTE — Transfer of Care (Signed)
Immediate Anesthesia Transfer of Care Note ? ?Patient: Dominique Bauer ? ?Procedure(s) Performed: CATARACT EXTRACTION PHACO AND INTRAOCULAR LENS PLACEMENT (IOC) (Right: Eye) ? ?Patient Location: PACU ? ?Anesthesia Type:MAC ? ?Level of Consciousness: awake, alert  and oriented ? ?Airway & Oxygen Therapy: Patient Spontanous Breathing ? ?Post-op Assessment: Report given to RN and Post -op Vital signs reviewed and stable ? ?Post vital signs: Reviewed and stable ? ?Last Vitals:  ?Vitals Value Taken Time  ?BP 114/75   ?Temp    ?Pulse 74   ?Resp 14   ?SpO2 96%   ? ? ?Last Pain:  ?Vitals:  ? 08/03/21 0952  ?TempSrc: Oral  ?PainSc:   ?   ? ?Patients Stated Pain Goal: 4 (08/03/21 2111) ? ?Complications: No notable events documented. ?

## 2021-08-03 NOTE — Anesthesia Postprocedure Evaluation (Signed)
Anesthesia Post Note ? ?Patient: [name] ? ?Procedure(s) Performed: CATARACT EXTRACTION PHACO AND INTRAOCULAR LENS PLACEMENT (IOC) (Right: Eye) ? ?Patient location during evaluation: Phase II ?Anesthesia Type: MAC ?Level of consciousness: awake ?Pain management: pain level controlled ?Vital Signs Assessment: post-procedure vital signs reviewed and stable ?Respiratory status: spontaneous breathing and respiratory function stable ?Cardiovascular status: blood pressure returned to baseline and stable ?Postop Assessment: no headache and no apparent nausea or vomiting ?Anesthetic complications: no ?Comments: Late entry ? ? ?No notable events documented. ? ? ?Last Vitals:  ?Vitals:  ? 08/03/21 0952 08/03/21 1031  ?BP: (!) 141/82 (!) 155/58  ?Pulse: 81 79  ?Resp: 18 20  ?Temp: 36.4 ?C 36.9 ?C  ?SpO2: 97% 98%  ?  ?Last Pain:  ?Vitals:  ? 08/03/21 1031  ?TempSrc: Oral  ?PainSc: 0-No pain  ? ? ?  ?  ?  ?  ?  ?  ? ?Louann Sjogren ? ? ? ? ?

## 2021-08-03 NOTE — Interval H&P Note (Signed)
History and Physical Interval Note: ? ?08/03/2021 ?10:05 AM ? ?Dominique Bauer  has presented today for surgery, with the diagnosis of Combined forms age related cataract; right.  The various methods of treatment have been discussed with the patient and family. After consideration of risks, benefits and other options for treatment, the patient has consented to  Procedure(s) with comments: ?CATARACT EXTRACTION PHACO AND INTRAOCULAR LENS PLACEMENT (IOC) (Right) - right as a surgical intervention.  The patient's history has been reviewed, patient examined, no change in status, stable for surgery.  I have reviewed the patient's chart and labs.  Questions were answered to the patient's satisfaction.   ? ? ?[name] ? ? ?

## 2021-08-03 NOTE — Op Note (Signed)
Date of procedure: 08/03/21 ? ?Pre-operative diagnosis: Visually significant age-related cataract, Right Eye; Visually Significant Astigmatism, Right Eye (H25.?1) ? ?Post-operative diagnosis: Visually significant age-related cataract, Right Eye; Visually Significant Astigmatism, Right Eye ? ?Procedure: Removal of cataract via phacoemulsification and insertion of intra-ocular lens Wynetta Emery and Johnson DIU150 +20.5D into the capsular bag of the Right Eye ? ?Attending surgeon: Gerda Diss. Marisa Hua, MD, MA ? ?Anesthesia: MAC, Topical Akten ? ?Complications: None ? ?Estimated Blood Loss: <89m (minimal) ? ?Specimens: None ? ?Implants: As above ? ?Indications:  Visually significant age-related cataract, Right Eye; Visually Significant Astigmatism, Right Eye ? ?Procedure:  ?The patient was seen and identified in the pre-operative area. The operative eye was identified and dilated.  The operative eye was marked.  Pre-operative toric markers were used to mark the eye at 0 and 180 degrees. Topical anesthesia was administered to the operative eye.    ? ?The patient was then to the operative suite and placed in the supine position.  A timeout was performed confirming the patient, procedure to be performed, and all other relevant information.   The patient's face was prepped and draped in the usual fashion for intra-ocular surgery.  A lid speculum was placed into the operative eye and the surgical microscope moved into place and focused.  A superotemporal paracentesis was created using a 20 gauge paracentesis blade.  Shugarcaine was injected into the anterior chamber.  Viscoelastic was injected into the anterior chamber.  A temporal clear-corneal main wound incision was created using a 2.472mmicrokeratome.  A continuous curvilinear capsulorrhexis was initiated using an irrigating cystitome and completed using capsulorrhexis forceps.  Hydrodissection and hydrodeliniation were performed.  Viscoelastic was injected into the anterior  chamber.  A phacoemulsification handpiece and a chopper as a second instrument were used to remove the nucleus and epinucleus. The irrigation/aspiration handpiece was used to remove any remaining cortical material.  ? ?The capsular bag was reinflated with viscoelastic, checked, and found to be intact.  The intraocular lens was inserted into the capsular bag and dialed into place using a Kuglen hook to ?? degrees.  The irrigation/aspiration handpiece was used to remove any remaining viscoelastic.  The clear corneal wound and paracentesis wounds were then hydrated and checked with Weck-Cels to be watertight.  The lid-speculum and drape was removed, and the patient's face was cleaned with a wet and dry 4x4.  A clear shield was taped over the eye. The patient was taken to the post-operative care unit in good condition, having tolerated the procedure well. ? ?Post-Op Instructions: [facility] and will receive all other orders and instructions. ? ?

## 2021-08-04 ENCOUNTER — Encounter (HOSPITAL_COMMUNITY): Payer: Self-pay | Admitting: Ophthalmology

## 2021-08-10 NOTE — Progress Notes (Signed)
?Cardiology Office Note:   ? ?Date:  08/12/2021  ? ?ID:  Dominique Bauer, DOB [birth year], MRN [id number] ? ?PCP:  [name], MD  ?Cardiologist:  Dr. Jenkins Rouge   ?Electrophysiologist:  n/a ? ?Referring MD: [name], MD  ? ?No chief complaint on file. ? ? ?History of Present Illness:   ? ?76 y.o. with PAF. History of HTN and OSA. Failed DCC alone and on flecainide 01/07/16. Subsequently begun on Tikosyn in hospital with successful [facility] 04/29/16.  Seen by Dr [name] 02/22/17 and beta blocker stopped due to relative bradycardia Last TTE done 08/14/20  No valve disease normal EF LA diameter 5.0 cm   ? ?CHADS2-VASc=5      ? ?Uses CPAP for OSA.  Sees Dede Query d/c 2019  due to frequent infections on it ? ?She has some LE edema.Takes her Aldactone and lasix sporadically the latter less to avoid hypokalemia on Tikosyn  ? ?Primary put her on Nexlitol in July 2021 Myalgias with statins LDL 73 A1c 8 Cr .9 K 4.5  ? ?No cardiac complaints Discussed needing better control of her BS ?Had cataract removed and lens implant right eye 08/03/21  ? ? ?Prior CV studies that were reviewed today include:   ? ?ETT 12/04/15 ?Blood pressure demonstrated a normal response to exercise. ?There was no ST segment deviation noted during stress. ?Negative, adequate stress test. ? ?TTE 08/14/20 ?IMPRESSIONS  ? ? ? 1. Left ventricular ejection fraction, by estimation, is 60 to 65%. The  ?left ventricle has normal function. The left ventricle has no regional  ?wall motion abnormalities. The left ventricular internal cavity size was  ?mildly dilated. Left ventricular  ?diastolic parameters are consistent with Grade I diastolic dysfunction  ?(impaired relaxation).  ? 2. Right ventricular systolic function is normal. The right ventricular  ?size is normal. Tricuspid regurgitation signal is inadequate for assessing  ?PA pressure.  ? 3. Left atrial size was moderately dilated.  ? 4. Right atrial size was mildly dilated.  ? 5. The mitral valve is  normal in structure. No evidence of mitral valve  ?regurgitation. No evidence of mitral stenosis.  ? 6. The aortic valve is tricuspid. Aortic valve regurgitation is not  ?visualized. No aortic stenosis is present.  ? ?Past Medical History:  ?Diagnosis Date  ? Arthritis   ? osteo arthritis  ? Body mass index 40.0-44.9, adult ([place])   ? Dysrhythmia   ? H/O measles   ? H/O mumps   ? H/O: whooping cough   ? History of chicken pox   ? Hypertension   ? Persistent atrial fibrillation ([place])   ? Sciatica   ? Sleep apnea   ? not using aything at night right now/LH  ? ? ?Past Surgical History:  ?Procedure Laterality Date  ? ABDOMINAL HYSTERECTOMY    ? BREAST BIOPSY Right 02/01/2012  ? BREAST EXCISIONAL BIOPSY Bilateral   ? benign  ? CARDIOVERSION N/A 11/17/2015  ? Procedure: CARDIOVERSION;  Surgeon: [name], MD;  Location: [place];  Service: Cardiovascular;  Laterality: N/A;  ? CARDIOVERSION N/A 01/07/2016  ? Procedure: CARDIOVERSION;  Surgeon: [name], MD;  Location: [place];  Service: Cardiovascular;  Laterality: N/A;  ? CARDIOVERSION N/A 04/29/2016  ? Procedure: CARDIOVERSION;  Surgeon: Sanda Klein, MD;  Location: [facility] ENDOSCOPY;  Service: Cardiovascular;  Laterality: N/A;  ? CATARACT EXTRACTION W/PHACO Left 07/20/2021  ? Procedure: CATARACT EXTRACTION PHACO AND INTRAOCULAR LENS PLACEMENT (IOC);  Surgeon: [name], MD;  [facility];  Service: Ophthalmology;  Laterality: Left;  CDE: 10.07  ? CATARACT EXTRACTION W/PHACO Right 08/03/2021  ? Procedure: CATARACT EXTRACTION PHACO AND INTRAOCULAR LENS PLACEMENT (IOC);  Surgeon: [name], MD;  [facility]: [facility];  Service: Ophthalmology;  Laterality: Right;  CDE 7.93  ? DG  BONE DENSITY ([place] HX)    ? INNER EAR SURGERY Left   ? ear drum replacement X2  ? NASAL SEPTUM SURGERY    ? ? ?Current Medications: ?Current Meds  ?Medication Sig  ? acetaminophen (TYLENOL) 650 MG CR tablet Take 650 mg by mouth every 8 (eight) hours as needed for pain  (arthritis).  ? apixaban (ELIQUIS) 5 MG TABS tablet Take 5 mg by mouth 2 (two) times daily.  ? BREO ELLIPTA 200-25 MCG/INH AEPB Inhale 1 puff into the lungs every morning. At the same time each day  ? calcium carbonate (OSCAL) 1500 (600 Ca) MG TABS tablet Take 1,500 mg by mouth daily with breakfast.  ? calcium carbonate (TUMS - DOSED IN MG ELEMENTAL CALCIUM) 500 MG chewable tablet Chew 1 tablet by mouth daily as needed for indigestion or heartburn.  ? Calcium Carbonate-Vit D-Min (CALTRATE PLUS PO) Take 1 capsule by mouth in the morning, at noon, and at bedtime.  ? cetirizine (ZYRTEC) 5 MG tablet Take 5 mg by mouth daily.  ? dofetilide (TIKOSYN) 125 MCG capsule TAKE THREE CAPSULES BY MOUTH TWICE DAILY (LEAVE in stock bottles)  ? Fluticasone-Salmeterol (ADVAIR) 100-50 MCG/DOSE AEPB Inhale 1 puff into the lungs daily as needed (shortness of breath).  ? furosemide (LASIX) 40 MG tablet Take 1 tablet (40 mg total) by mouth every other day. as needed x 2 doses for swelling.  ? glimepiride (AMARYL) 2 MG tablet Take 2-4 mg by mouth as directed. Patient takes 2 tablets in the morning and 2 tablet at night  ? guaiFENesin (MUCINEX) 600 MG 12 hr tablet Take 600 mg by mouth daily.  ? HYDROcodone-acetaminophen (NORCO/VICODIN) 5-325 MG tablet Take 1 tablet by mouth every 6 (six) hours as needed for moderate pain.  ? L-LYSINE PO TAKE ONE TABLET BY MOUTH DAILY  ? levalbuterol (XOPENEX) 0.63 MG/3ML nebulizer solution 1 Ampule(s) Via Nebulizer 4 Times Daily as needed for shortness of breath/congestion  ? linaGLIPtin (TRADJENTA PO) Take 5 mg by mouth daily.   ? losartan (COZAAR) 50 MG tablet Take 1 tablet by mouth daily.  ? magnesium oxide (MAG-OX) 400 MG tablet TAKE 1 TABLET BY MOUTH TWICE A DAY  ? metFORMIN (GLUCOPHAGE-XR) 500 MG 24 hr tablet Take 500 mg by mouth 2 (two) times daily.  ? metoprolol tartrate (LOPRESSOR) 25 MG tablet Take 25 mg by mouth. 1 tablet in the morning and 1/2 tablet in the evening  ? Multiple Vitamins-Minerals  (ZINC PO) Take 1 tablet by mouth daily.  ? multivitamin-lutein (OCUVITE-LUTEIN) CAPS capsule Take 1 capsule by mouth daily.  ? Omega-3 Fatty Acids (FISH OIL) 1000 MG CAPS Take 1,000 mg by mouth 3 (three) times daily.   ? triamcinolone cream (KENALOG) 0.1 % Apply 1 application topically daily as needed. For rash  ?  ? ?Allergies:   Penicillins, Other, and Neosporin [neomycin-bacitracin zn-polymyx]  ? ?Social History  ? ?Socioeconomic History  ? Marital status: Married  ?  Spouse name: Not on file  ? Number of children: Not on file  ? Years of education: Not on file  ? Highest education level: Not on file  ?Occupational History  ? Not on file  ?Tobacco Use  ? Smoking  status: Never  ?  Passive exposure: Past  ? Smokeless tobacco: Never  ?Vaping Use  ? Vaping Use: Never used  ?Substance and Sexual Activity  ? Alcohol use: No  ? Drug use: No  ? Sexual activity: Yes  ?Other Topics Concern  ? Not on file  ?Social History Narrative  ? 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  ? ?Social Determinants of Health  ? ?Financial Resource Strain: Not on file  ?Food Insecurity: Not on file  ?Transportation Needs: Not on file  ?Physical Activity: Not on file  ?Stress: Not on file  ?Social Connections: Not on file  ?  ? ?Family History:  The patient's family history includes Breast cancer in her maternal aunt and sister; Hypertension in her mother.  ? ?ROS:   ?Please see the history of present illness.    ?Review of Systems  ?Constitutional: Positive for diaphoresis and malaise/fatigue.  ?Cardiovascular:  Positive for dyspnea on exertion and irregular heartbeat.  ?Respiratory:  Positive for snoring.   ?Skin:  Positive for rash.  ?Musculoskeletal:  Positive for back pain, joint swelling and myalgias.  ?Neurological:  Positive for dizziness and headaches.  All other systems reviewed and are negative. ? ? ?EKGs/Labs/Other Test Reviewed:   ? ?EKG:   05/15/20 SB rate 59 first degree QT 440  08/12/2021 SR rate 69 PR 228 msec otherwise normal  ? ?Recent  Labs: ?05/06/2021: BUN 15; Creatinine, Ser 0.69; Hemoglobin 12.9; Magnesium 2.0; Platelets 291; Potassium 4.6; Sodium 136  ? ?Recent Lipid Panel ?No results found for: CHOL, TRIG, HDL, CHOLHDL, VLDL, LDLCALC,

## 2021-08-12 ENCOUNTER — Ambulatory Visit: Payer: Medicare Other | Admitting: Cardiovascular Disease

## 2021-08-12 ENCOUNTER — Encounter: Payer: Self-pay | Admitting: Cardiovascular Disease

## 2021-08-12 VITALS — BP 132/68 | HR 69 | Ht 68.0 in | Wt 267.6 lb

## 2021-08-12 DIAGNOSIS — I10 Essential (primary) hypertension: Secondary | ICD-10-CM

## 2021-08-12 DIAGNOSIS — I480 Paroxysmal atrial fibrillation: Secondary | ICD-10-CM

## 2021-08-12 DIAGNOSIS — R0600 Dyspnea, unspecified: Secondary | ICD-10-CM | POA: Diagnosis not present

## 2021-08-12 NOTE — Patient Instructions (Addendum)
Medication Instructions:  ?Your physician recommends that you continue on your current medications as directed. Please refer to the Current Medication list given to you today. ? ?*If you need a refill on your cardiac medications before your next appointment, please call your pharmacy* ? ?Lab Work: ?Your physician recommends that you have lab work today- BMET and BNP. ?If you have labs (blood work) drawn today and your tests are completely normal, you will receive your results only by: ?MyChart Message (if you have MyChart) OR ?A paper copy in the mail ?If you have any lab test that is abnormal or we need to change your treatment, we will call you to review the results. ? ?Testing/Procedures: ?None ordered today. ? ?Follow-Up: ?At [facility], you and your health needs are our priority.  As part of our continuing mission to provide you with exceptional heart care, we have created designated Provider Care Teams.  These Care Teams include your primary Cardiologist (physician) and Advanced Practice Providers (APPs -  Physician Assistants and Nurse Practitioners) who all work together to provide you with the care you need, when you need it. ? ?We recommend signing up for the patient portal called "MyChart".  Sign up information is provided on this After Visit Summary.  MyChart is used to connect with patients for Virtual Visits (Telemedicine).  Patients are able to view lab/test results, encounter notes, upcoming appointments, etc.  Non-urgent messages can be sent to your provider as well.   ?To learn more about what you can do with MyChart, go to [email or web address].   ? ?Your next appointment:   ?1 year(s) ? ?The format for your next appointment:   ?In Person ? ?Provider:   ?Jenkins Rouge, MD { ? ? ? ?Important Information About Sugar ? ? ? ? ?  ?

## 2021-08-13 LAB — BASIC METABOLIC PANEL
BUN/Creatinine Ratio: 23 (ref 12–28)
BUN: 16 mg/dL (ref 8–27)
CO2: 20 mmol/L (ref 20–29)
Calcium: 10.1 mg/dL (ref 8.7–10.3)
Chloride: 97 mmol/L (ref 96–106)
Creatinine, Ser: 0.71 mg/dL (ref 0.57–1.00)
Glucose: 179 mg/dL — ABNORMAL HIGH (ref 70–99)
Potassium: 4.3 mmol/L (ref 3.5–5.2)
Sodium: 136 mmol/L (ref 134–144)
eGFR: 88 mL/min/{1.73_m2} (ref >59)

## 2021-08-13 LAB — PRO B NATRIURETIC PEPTIDE: NT-Pro BNP: 270 pg/mL (ref 0–738)

## 2021-08-27 DIAGNOSIS — E7849 Other hyperlipidemia: Secondary | ICD-10-CM | POA: Diagnosis not present

## 2021-08-27 DIAGNOSIS — Z0000 Encounter for general adult medical examination without abnormal findings: Secondary | ICD-10-CM | POA: Diagnosis not present

## 2021-08-27 DIAGNOSIS — J310 Chronic rhinitis: Secondary | ICD-10-CM | POA: Diagnosis not present

## 2021-08-27 DIAGNOSIS — I10 Essential (primary) hypertension: Secondary | ICD-10-CM | POA: Diagnosis not present

## 2021-08-27 DIAGNOSIS — M5459 Other low back pain: Secondary | ICD-10-CM | POA: Diagnosis not present

## 2021-08-27 DIAGNOSIS — E1165 Type 2 diabetes mellitus with hyperglycemia: Secondary | ICD-10-CM | POA: Diagnosis not present

## 2021-08-28 DIAGNOSIS — I10 Essential (primary) hypertension: Secondary | ICD-10-CM | POA: Diagnosis not present

## 2021-08-28 DIAGNOSIS — Z0000 Encounter for general adult medical examination without abnormal findings: Secondary | ICD-10-CM | POA: Diagnosis not present

## 2021-08-28 DIAGNOSIS — E7849 Other hyperlipidemia: Secondary | ICD-10-CM | POA: Diagnosis not present

## 2021-08-28 DIAGNOSIS — E1165 Type 2 diabetes mellitus with hyperglycemia: Secondary | ICD-10-CM | POA: Diagnosis not present

## 2021-09-01 ENCOUNTER — Encounter: Payer: Self-pay | Admitting: *Deleted

## 2021-09-07 DIAGNOSIS — M9902 Segmental and somatic dysfunction of thoracic region: Secondary | ICD-10-CM | POA: Diagnosis not present

## 2021-09-07 DIAGNOSIS — M9904 Segmental and somatic dysfunction of sacral region: Secondary | ICD-10-CM | POA: Diagnosis not present

## 2021-09-07 DIAGNOSIS — M9903 Segmental and somatic dysfunction of lumbar region: Secondary | ICD-10-CM | POA: Diagnosis not present

## 2021-09-07 DIAGNOSIS — M5137 Other intervertebral disc degeneration, lumbosacral region: Secondary | ICD-10-CM | POA: Diagnosis not present

## 2021-09-22 ENCOUNTER — Encounter: Payer: Self-pay | Admitting: *Deleted

## 2021-09-22 DIAGNOSIS — E1142 Type 2 diabetes mellitus with diabetic polyneuropathy: Secondary | ICD-10-CM | POA: Diagnosis not present

## 2021-09-22 DIAGNOSIS — L84 Corns and callosities: Secondary | ICD-10-CM | POA: Diagnosis not present

## 2021-09-22 DIAGNOSIS — M79676 Pain in unspecified toe(s): Secondary | ICD-10-CM | POA: Diagnosis not present

## 2021-09-22 DIAGNOSIS — B351 Tinea unguium: Secondary | ICD-10-CM | POA: Diagnosis not present

## 2021-09-23 DIAGNOSIS — B028 Zoster with other complications: Secondary | ICD-10-CM | POA: Diagnosis not present

## 2021-09-24 NOTE — Progress Notes (Signed)
Please schedule. thanks

## 2021-09-24 NOTE — Progress Notes (Signed)
Medical history meets ASA 3 criteria, also on a blood thinner. Needs OV. OK to schedule for VV on a Friday with me if she has the capability.

## 2021-10-05 DIAGNOSIS — M9903 Segmental and somatic dysfunction of lumbar region: Secondary | ICD-10-CM | POA: Diagnosis not present

## 2021-10-05 DIAGNOSIS — M9904 Segmental and somatic dysfunction of sacral region: Secondary | ICD-10-CM | POA: Diagnosis not present

## 2021-10-05 DIAGNOSIS — M5137 Other intervertebral disc degeneration, lumbosacral region: Secondary | ICD-10-CM | POA: Diagnosis not present

## 2021-10-05 DIAGNOSIS — M9902 Segmental and somatic dysfunction of thoracic region: Secondary | ICD-10-CM | POA: Diagnosis not present

## 2021-10-19 ENCOUNTER — Encounter: Payer: Self-pay | Admitting: Gastroenterology

## 2021-10-19 NOTE — Progress Notes (Unsigned)
Referring Provider: [name], MD Primary Care Physician:  [name], MD Primary Gastroenterologist:  Dr. Abbey Chatters  Chief Complaint  Patient presents with   Colonoscopy    Diarrhea sometimes because of Metformin    HPI:   Dominique Bauer is a 76 y.o. female presenting today at the request of Hasanaj, Samul Dada, MD for consult colonoscopy.   Had colonoscopy in the past, possibly 2. One with Dr. Anthony Sar. Can't remember who did the other one. Doesn't think it has been 10 years. No polyps. Maternal grandfather with history of colon cancer in his 63-80s and couple of aunts in their 20s-80s with colon cancer on fathers side.   Feeling well overall.  She reports occasional diarrhea related to metformin, but nothing routine. Occasional abdominal discomfort letting her know she is going to have a bowel movement. No dietary triggers. Usually just 1 BM per day even when having diarrhea. Denies BRBPR, melena, unintentional weight loss, nausea, vomiting, heartburn symptoms, dysphagia.   Occasional SOB with exertion. No CP or palpitations.    Past Medical History:  Diagnosis Date   Arthritis    osteo arthritis   Body mass index 40.0-44.9, adult ([place])    Diabetes ([place])    Dysrhythmia    H/O measles    H/O mumps    H/O: whooping cough    History of chicken pox    HLD (hyperlipidemia)    Hypertension    Persistent atrial fibrillation ([facility])    Sciatica    Sleep apnea    not using aything at night right now/LH    Past Surgical History:  Procedure Laterality Date   ABDOMINAL HYSTERECTOMY     BREAST BIOPSY Right 02/01/2012   BREAST EXCISIONAL BIOPSY Bilateral    benign   CARDIOVERSION N/A 11/17/2015   Procedure: CARDIOVERSION;  Surgeon: [name], MD;  Location[facility] ENDOSCOPY;  Service: Cardiovascular;  Laterality: N/A;   CARDIOVERSION N/A 01/07/2016   Procedure: CARDIOVERSION;  Surgeon: [name], MD;  Location: [place];  Service: Cardiovascular;   Laterality: N/A;   CARDIOVERSION N/A 04/29/2016   Procedure: CARDIOVERSION;  Surgeon: Sanda Klein, MD;  Location: [place];  Service: Cardiovascular;  Laterality: N/A;   CATARACT EXTRACTION W/PHACO Left 07/20/2021   Procedure: CATARACT EXTRACTION PHACO AND INTRAOCULAR LENS PLACEMENT ([place]);  Surgeon: [name], MD;  Location: [facility];  Service: Ophthalmology;  Laterality: Left;  CDE: 10.07   CATARACT EXTRACTION W/PHACO Right 08/03/2021   Procedure: CATARACT EXTRACTION PHACO AND INTRAOCULAR LENS PLACEMENT (IOC);  Surgeon: [name], MD;  [facility];  Service: Ophthalmology;  Laterality: Right;  CDE 7.93   DG  BONE DENSITY ([facility] HX)     INNER EAR SURGERY Left    ear drum replacement X2   NASAL SEPTUM SURGERY      Current Outpatient Medications  Medication Sig Dispense Refill   acetaminophen (TYLENOL) 650 MG CR tablet Take 650 mg by mouth every 8 (eight) hours as needed for pain (arthritis).     apixaban (ELIQUIS) 5 MG TABS tablet Take 5 mg by mouth 2 (two) times daily.     calcium carbonate (OSCAL) 1500 (600 Ca) MG TABS tablet Take 1,500 mg by mouth daily with breakfast.     Calcium Carbonate-Vit D-Min (CALTRATE PLUS PO) Take 1 capsule by mouth in the morning, at noon, and at bedtime.     cetirizine (ZYRTEC) 5 MG tablet Take 5 mg by mouth daily.     dofetilide (TIKOSYN) 125  MCG capsule TAKE THREE CAPSULES BY MOUTH TWICE DAILY (LEAVE in stock bottles) 540 capsule 3   Fluticasone-Salmeterol (ADVAIR) 100-50 MCG/DOSE AEPB Inhale 1 puff into the lungs daily as needed (shortness of breath).     furosemide (LASIX) 40 MG tablet Take 1 tablet (40 mg total) by mouth every other day. as needed x 2 doses for swelling. 30 tablet 8   glimepiride (AMARYL) 2 MG tablet Take 2-4 mg by mouth as directed. Patient takes 2 tablets in the morning and 2 tablet at night     HYDROcodone-acetaminophen (NORCO/VICODIN) 5-325 MG tablet Take 1 tablet by mouth every 6 (six) hours as needed for moderate pain.      levalbuterol (XOPENEX) 0.63 MG/3ML nebulizer solution 1 Ampule(s) Via Nebulizer 4 Times Daily as needed for shortness of breath/congestion     linaGLIPtin (TRADJENTA PO) Take 5 mg by mouth daily.      losartan (COZAAR) 50 MG tablet Take 1 tablet by mouth daily.     magnesium oxide (MAG-OX) 400 MG tablet TAKE 1 TABLET BY MOUTH TWICE A DAY 180 tablet 3   metFORMIN (GLUCOPHAGE-XR) 500 MG 24 hr tablet Take 500 mg by mouth 2 (two) times daily.     metoprolol tartrate (LOPRESSOR) 25 MG tablet Take 25 mg by mouth. 1 tablet in the morning and 1/2 tablet in the evening     Multiple Vitamins-Minerals (ZINC PO) Take 1 tablet by mouth daily.     multivitamin-lutein (OCUVITE-LUTEIN) CAPS capsule Take 1 capsule by mouth daily.     Omega-3 Fatty Acids (FISH OIL) 1000 MG CAPS Take 1,000 mg by mouth 3 (three) times daily.      triamcinolone cream (KENALOG) 0.1 % Apply 1 application topically daily as needed. For rash     No current facility-administered medications for this visit.    Allergies as of 10/21/2021 - Review Complete 10/21/2021  Allergen Reaction Noted   Penicillins Hives 11/04/2015   Other  02/22/2017   Neosporin [neomycin-bacitracin zn-polymyx] Rash 11/04/2015    Family History  Problem Relation Age of Onset   Hypertension Mother    Breast cancer Sister    Colon cancer Maternal Grandmother        70s-80s   Breast cancer Maternal Aunt    Colon cancer Paternal Aunt        50s-80s   Colon cancer Paternal Aunt        31s-80s    Social History   Socioeconomic History   Marital status: Married    Spouse name: Not on file   Number of children: Not on file   Years of education: Not on file   Highest education level: Not on file  Occupational History   Not on file  Tobacco Use   Smoking status: Never    Passive exposure: Past   Smokeless tobacco: Never  Vaping Use   Vaping Use: Never used  Substance and Sexual Activity   Alcohol use: No   Drug use: No   Sexual activity:  Yes  Other Topics Concern   Not on file  Social History Narrative   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   Social Determinants of Health   Financial Resource Strain: Not on file  Food Insecurity: Not on file  Transportation Needs: Not on file  Physical Activity: Not on file  Stress: Not on file  Social Connections: Not on file  Intimate Partner Violence: Not on file    Review of Systems: Gen: Denies any fever, chills, cold  or flu like symptoms, pre-syncope, or syncope.  CV: Denies chest pain, heart palpitations.  Resp: Denies shortness of breath, cough.  GI: See HPI GU : Denies urinary burning, urinary frequency, urinary hesitancy MS: Denies joint pain. Derm: Denies rash. Psych: Denies depression, anxiety. Heme: See HPI  Physical Exam: BP (!) 184/82 (BP Location: Right Arm, Patient Position: Sitting, Cuff Size: Large)   Pulse 81   Temp 97.6 F (36.4 C) (Temporal)   Ht '5\' 11"'$  (1.803 m)   Wt 267 lb 3.2 oz (121.2 kg)   BMI 37.27 kg/m  General:   Alert and oriented. Pleasant and cooperative. Well-nourished and well-developed.  Head:  Normocephalic and atraumatic. Eyes:  Without icterus, sclera clear and conjunctiva pink.  Ears:  Normal auditory acuity. Lungs:  Clear to auscultation bilaterally. No wheezes, rales, or rhonchi. No distress.  Heart:  S1, S2 present without murmurs appreciated.  Abdomen:  +BS, soft, non-tender and non-distended. No HSM noted. No guarding or rebound. No masses appreciated.  Rectal:  Deferred  Msk:  Symmetrical without gross deformities. Normal posture. Extremities:  Without edema. Neurologic:  Alert and  oriented x4;  grossly normal neurologically. Skin:  Intact without significant lesions or rashes. Psych:  Normal mood and affect.    Assessment:  76 year old female with history of HTN, atrial fibrillation on Eliquis, diabetes, presenting today to discuss scheduling colonoscopy.  She reports having 2 colonoscopies in the past, last colonoscopy  at [facility] which she thinks was completed within the last 10 years.  Denies any history of colon polyps.  She does have family history of colon cancer in her maternal grandfather in his 62s or 73s and 2 paternal aunts in their 20s or 3s.  She has no significant GI symptoms.  No alarm symptoms.  We will plan to request her colonoscopy records to determine timing of next colonoscopy.    Plan:  Request colonoscopy records from [facility] to determine timing of next colonoscopy.  Further recommendations to follow.    Aliene Altes, PA-C [facility]astroenter[facility] 10/21/2021

## 2021-10-21 ENCOUNTER — Ambulatory Visit: Payer: Medicare Other | Admitting: Gastroenterology

## 2021-10-21 ENCOUNTER — Telehealth: Payer: Self-pay | Admitting: *Deleted

## 2021-10-21 ENCOUNTER — Telehealth: Payer: Self-pay | Admitting: Gastroenterology

## 2021-10-21 ENCOUNTER — Encounter: Payer: Self-pay | Admitting: Gastroenterology

## 2021-10-21 VITALS — BP 184/82 | HR 81 | Temp 97.6°F | Ht 71.0 in | Wt 267.2 lb

## 2021-10-21 DIAGNOSIS — Z1211 Encounter for screening for malignant neoplasm of colon: Secondary | ICD-10-CM | POA: Diagnosis not present

## 2021-10-21 NOTE — Telephone Encounter (Signed)
Please let patient know that I received and reviewed her colonoscopy records.  Her last colonoscopy was in May 2013 with Dr. Valentino Saxon.  Her colonoscopy was normal at that time.  She is due for screening colonoscopy now.  Mindy: Please arrange colonoscopy with propofol with Dr. Abbey Chatters.  ASA 3. We will need to get approval from cardiology to hold Eliquis for 48 hours prior to procedure.  1 day prior to procedure: One half dose of glimepiride (1 tablet in the morning and evening), one half dose of metformin (500 mg in the morning), take linagliptin as prescribed. Day of procedure: Do not take any morning diabetes medications.

## 2021-10-21 NOTE — Telephone Encounter (Signed)
Spoke with pt and is aware of recs. She is agreeable and is aware will call back to schedule once we get clearance from cardiology for her eliquis

## 2021-10-21 NOTE — Telephone Encounter (Signed)
Clinical pharmacist to review Eliquis 

## 2021-10-21 NOTE — Telephone Encounter (Signed)
   What type of surgery is being performed? Colonoscopy  When is surgery scheduled? TBD  Requesting clearance to hold Eliquis 48 hours prior to procedure  Name of physician performing surgery?  Dr. Arvilla Meres [facility] Phone: [phone] Fax: [phone]  Anethesia type? MAC

## 2021-10-21 NOTE — Patient Instructions (Signed)
We are requesting your colonoscopy records from [facility].  Once I have reviewed these, we will let you know when you are due for your next colonoscopy.  It was very nice meeting you today!  Aliene Altes, PA-C [facility]astroenter[facility]

## 2021-10-23 NOTE — Telephone Encounter (Signed)
Patient with diagnosis of A Fib on Eliquis for anticoagulation.    Procedure: colonoscopy Date of procedure: TBD   CHA2DS2-VASc Score = 4  This indicates a 4.8% annual risk of stroke. The patient's score is based upon: CHF History: 0 HTN History: 1 Diabetes History: 0 Stroke History: 0 Vascular Disease History: 0 Age Score: 2 Gender Score: 1    CrCl 97 mL/min Platelet count 291K   Per office protocol, patient can hold Eliquis for 2 days prior to procedure.     **This guidance is not considered finalized until pre-operative APP has relayed final recommendations.**

## 2021-10-23 NOTE — Telephone Encounter (Signed)
   Patient Name: Dominique Bauer  DOB: 11/25/1945 MRN: [id number]  Primary Cardiologist: Jenkins Rouge, MD  Clinical pharmacist have reviewed the patient's past medical history, labs, and current medications as part of pre-operative protocol coverage.   The following recommendations have been made:  Patient with diagnosis of A Fib on Eliquis for anticoagulation.     Procedure: colonoscopy Date of procedure: TBD     CHA2DS2-VASc Score = 4  This indicates a 4.8% annual risk of stroke. The patient's score is based upon: CHF History: 0 HTN History: 1 Diabetes History: 0 Stroke History: 0 Vascular Disease History: 0 Age Score: 2 Gender Score: 1     CrCl 97 mL/min Platelet count 291K     Per office protocol, patient can hold Eliquis for 2 days prior to procedure.   I will route this recommendation to the requesting party via Epic fax function and remove from pre-op pool.  Please call with questions.  Lenna Sciara, NP 10/23/2021, 12:13 PM

## 2021-10-26 DIAGNOSIS — G4733 Obstructive sleep apnea (adult) (pediatric): Secondary | ICD-10-CM | POA: Diagnosis not present

## 2021-10-27 ENCOUNTER — Other Ambulatory Visit: Payer: Self-pay | Admitting: Internal Medicine

## 2021-10-27 ENCOUNTER — Encounter: Payer: Self-pay | Admitting: *Deleted

## 2021-10-27 DIAGNOSIS — Z1231 Encounter for screening mammogram for malignant neoplasm of breast: Secondary | ICD-10-CM

## 2021-10-27 MED ORDER — PEG 3350-KCL-NA BICARB-NACL 420 G PO SOLR: 4000.0000 mL | Freq: Once | ORAL | 0 refills | Status: AC

## 2021-10-27 NOTE — Telephone Encounter (Signed)
PA approved via [facility]. Auth# [id number], DOS: Nov 12, 2021 - Feb 10, 2022

## 2021-10-27 NOTE — Telephone Encounter (Signed)
Pt appointment for colonoscopy 11/13/18, Thursday at 12:45 pm. Made aware

## 2021-10-27 NOTE — Telephone Encounter (Signed)
Reviewed.  Patient has been cleared to hold Eliquis for 48 hours prior to procedure.  Please proceed with scheduling colonoscopy as planned.

## 2021-10-27 NOTE — Addendum Note (Signed)
Addended by: Madelin Rear on: 10/27/2021 03:14 PM   Modules accepted: Orders

## 2021-10-28 ENCOUNTER — Encounter: Payer: Self-pay | Admitting: *Deleted

## 2021-11-02 DIAGNOSIS — M5137 Other intervertebral disc degeneration, lumbosacral region: Secondary | ICD-10-CM | POA: Diagnosis not present

## 2021-11-02 DIAGNOSIS — M9902 Segmental and somatic dysfunction of thoracic region: Secondary | ICD-10-CM | POA: Diagnosis not present

## 2021-11-02 DIAGNOSIS — M9904 Segmental and somatic dysfunction of sacral region: Secondary | ICD-10-CM | POA: Diagnosis not present

## 2021-11-02 DIAGNOSIS — M9903 Segmental and somatic dysfunction of lumbar region: Secondary | ICD-10-CM | POA: Diagnosis not present

## 2021-11-10 ENCOUNTER — Encounter (HOSPITAL_COMMUNITY): Admission: RE | Admit: 2021-11-10 | Payer: Medicare Other | Source: Ambulatory Visit

## 2021-11-12 ENCOUNTER — Encounter (HOSPITAL_COMMUNITY): Admission: RE | Payer: Self-pay | Source: Home / Self Care

## 2021-11-12 ENCOUNTER — Ambulatory Visit (HOSPITAL_COMMUNITY): Admission: RE | Admit: 2021-11-12 | Payer: Medicare Other | Source: Home / Self Care

## 2021-11-12 SURGERY — COLONOSCOPY WITH PROPOFOL
Anesthesia: Monitor Anesthesia Care

## 2021-11-13 ENCOUNTER — Encounter: Payer: Self-pay | Admitting: *Deleted

## 2021-11-13 ENCOUNTER — Other Ambulatory Visit: Payer: Self-pay | Admitting: *Deleted

## 2021-11-13 NOTE — Patient Outreach (Signed)
  Care Coordination   Initial Visit Note   11/13/2021 Name: Dominique Bauer MRN: [id number] DOB: Jul 12, 1945  Dominique Bauer is a 76 y.o. year old female who sees Hasanaj, Samul Dada, MD for primary care. I spoke with  Galvin Proffer by phone today  What matters to the patients health and wellness today?  Agrees she need to increase exercise and watch diet more in effort to decrease A1C.  State she and husband eat take out a lot, declines offer for Mom's meals, not a candidate for meals on wheels as she is not home bound.  Report she had AWV in June.  State she has frequent respiratory infections, discussed infection prevention and using vitamins to increase immune system.    Goals Addressed               This Visit's Progress     Management of DM, decrease A1C to goal of 7, currently 8 (pt-stated)        Care Coordination Interventions: Provided education to patient about basic DM disease process Reviewed medications with patient and discussed importance of medication adherence Discussed plans with patient for ongoing care management follow up and provided patient with direct contact information for care management team Reviewed scheduled/upcoming provider appointments including: need for AWV, next PCP appointment in September Referral made to pharmacy team for assistance with cost of Tradjenta and Eliquis Assessed social determinant of health barriers         SDOH assessments and interventions completed:  Yes  SDOH Interventions Today    Flowsheet Row Most Recent Value  SDOH Interventions   Food Insecurity Interventions Intervention Not Indicated  Financial Strain Interventions Intervention Not Indicated  Housing Interventions Intervention Not Indicated  Transportation Interventions Intervention Not Indicated        Care Coordination Interventions Activated:  Yes  Care Coordination Interventions:  Yes, provided   Follow up plan: Follow up call scheduled  for 9/28    Encounter Outcome:  Pt. Visit Completed   [name], RN, MSN, [place] Coordinator [phone]

## 2021-11-20 ENCOUNTER — Ambulatory Visit: Payer: Medicare Other

## 2021-12-02 DIAGNOSIS — M9902 Segmental and somatic dysfunction of thoracic region: Secondary | ICD-10-CM | POA: Diagnosis not present

## 2021-12-02 DIAGNOSIS — M9904 Segmental and somatic dysfunction of sacral region: Secondary | ICD-10-CM | POA: Diagnosis not present

## 2021-12-02 DIAGNOSIS — M9903 Segmental and somatic dysfunction of lumbar region: Secondary | ICD-10-CM | POA: Diagnosis not present

## 2021-12-02 DIAGNOSIS — M5137 Other intervertebral disc degeneration, lumbosacral region: Secondary | ICD-10-CM | POA: Diagnosis not present

## 2021-12-08 DIAGNOSIS — M79676 Pain in unspecified toe(s): Secondary | ICD-10-CM | POA: Diagnosis not present

## 2021-12-08 DIAGNOSIS — L84 Corns and callosities: Secondary | ICD-10-CM | POA: Diagnosis not present

## 2021-12-08 DIAGNOSIS — B351 Tinea unguium: Secondary | ICD-10-CM | POA: Diagnosis not present

## 2021-12-08 DIAGNOSIS — E1142 Type 2 diabetes mellitus with diabetic polyneuropathy: Secondary | ICD-10-CM | POA: Diagnosis not present

## 2021-12-10 DIAGNOSIS — E7849 Other hyperlipidemia: Secondary | ICD-10-CM | POA: Diagnosis not present

## 2021-12-10 DIAGNOSIS — J45991 Cough variant asthma: Secondary | ICD-10-CM | POA: Diagnosis not present

## 2021-12-10 DIAGNOSIS — E1169 Type 2 diabetes mellitus with other specified complication: Secondary | ICD-10-CM | POA: Diagnosis not present

## 2021-12-10 DIAGNOSIS — I10 Essential (primary) hypertension: Secondary | ICD-10-CM | POA: Diagnosis not present

## 2021-12-11 ENCOUNTER — Ambulatory Visit
Admission: RE | Admit: 2021-12-11 | Discharge: 2021-12-11 | Disposition: A | Discharge status: Home / Self Care | Payer: Medicare Other | Source: Ambulatory Visit | Attending: Internal Medicine | Admitting: Internal Medicine

## 2021-12-11 DIAGNOSIS — Z1231 Encounter for screening mammogram for malignant neoplasm of breast: Secondary | ICD-10-CM

## 2021-12-21 ENCOUNTER — Other Ambulatory Visit: Payer: Self-pay

## 2021-12-24 ENCOUNTER — Ambulatory Visit: Payer: Self-pay | Admitting: *Deleted

## 2021-12-24 ENCOUNTER — Other Ambulatory Visit: Payer: Self-pay | Admitting: Internal Medicine

## 2021-12-24 NOTE — Patient Outreach (Signed)
  Care Coordination   Follow Up Visit Note   12/24/2021 Name: Dominique Bauer MRN: [id number] DOB: 05/08/1945  Dominique Bauer is a 76 y.o. year old female who sees Hasanaj, Samul Dada, MD for primary care. I spoke with  Galvin Proffer by phone today.  What matters to the patients health and wellness today?  Continue to manage diabetes, blood sugar remains greater then 200.  Denies any urgent concerns, encouraged to contact this care manager with questions.      Goals Addressed               This Visit's Progress     Management of DM, decrease A1C to goal of 7, currently 8 (pt-stated)   On track     Care Coordination Interventions: Provided education to patient about basic DM disease process Reviewed medications with patient and discussed importance of medication adherence Discussed plans with patient for ongoing care management follow up and provided patient with direct contact information for care management team Reviewed scheduled/upcoming provider appointments including: need for AWV, next PCP appointment in September Referral made to pharmacy team for assistance with cost of Tradjenta and Eliquis Assessed social determinant of health barriers Discussed medication changes (now off Metformin, taking Ozempic) Encouraged to contact with side effects and/or issues with cost         SDOH assessments and interventions completed:  No     Care Coordination Interventions Activated:  Yes  Care Coordination Interventions:  No, not indicated   Follow up plan: Follow up call scheduled for 10/27    Encounter Outcome:  Pt. Visit Completed   [name], RN, MSN, [place] Care Management Care Management Coordinator [phone]

## 2021-12-24 NOTE — Patient Instructions (Signed)
Visit Information  Thank you for taking time to visit with me today. Please don't hesitate to contact me if I can be of assistance to you before our next scheduled telephone appointment.  Following are the goals we discussed today:  Take Ozempic as instructed. Monitor blood sugar at least daily  Our next appointment is by telephone on 10/27  Please call the care guide team at [phone] if you need to cancel or reschedule your appointment.   Please call the Suicide and Crisis Lifeline: 988 call the [place] National Suicide Prevention Lifeline: (639)824-2612 or TTY: 272-602-9049 TTY (870)679-6802) to talk to a trained counselor call 1-800-273-TALK (toll free, 24 hour hotline) call the [facility]: 567-611-2374 call 911 if you are experiencing a Mental Health or [place] or need someone to talk to.  Patient verbalizes understanding of instructions and care plan provided today and agrees to view in [place]. Active MyChart status and patient understanding of how to access instructions and care plan via MyChart confirmed with patient.     The patient has been provided with contact information for the care management team and has been advised to call with any health related questions or concerns.   [name], RN, MSN, [place] Care Management Care Management Coordinator [phone]

## 2021-12-30 DIAGNOSIS — M5137 Other intervertebral disc degeneration, lumbosacral region: Secondary | ICD-10-CM | POA: Diagnosis not present

## 2021-12-30 DIAGNOSIS — M9904 Segmental and somatic dysfunction of sacral region: Secondary | ICD-10-CM | POA: Diagnosis not present

## 2021-12-30 DIAGNOSIS — M9902 Segmental and somatic dysfunction of thoracic region: Secondary | ICD-10-CM | POA: Diagnosis not present

## 2021-12-30 DIAGNOSIS — M9903 Segmental and somatic dysfunction of lumbar region: Secondary | ICD-10-CM | POA: Diagnosis not present

## 2022-01-22 ENCOUNTER — Encounter: Payer: Self-pay | Admitting: *Deleted

## 2022-01-22 ENCOUNTER — Ambulatory Visit: Payer: Self-pay | Admitting: *Deleted

## 2022-01-22 NOTE — Patient Outreach (Signed)
  Care Coordination   01/22/2022 Name: Dominique Bauer MRN: [id number] DOB: 02-27-1946   Care Coordination Outreach Attempts:  An unsuccessful telephone outreach was attempted today to offer the patient information about available care coordination services as a benefit of their health plan.   Follow Up Plan:  Additional outreach attempts will be made to offer the patient care coordination information and services.   Encounter Outcome:  No Answer  Care Coordination Interventions Activated:  No   Care Coordination Interventions:  No, not indicated    Chong Sicilian, BSN, RN-BC RN Care Coordinator [place]: [phone] Main #: [phone]

## 2022-02-01 DIAGNOSIS — M9904 Segmental and somatic dysfunction of sacral region: Secondary | ICD-10-CM | POA: Diagnosis not present

## 2022-02-01 DIAGNOSIS — M9902 Segmental and somatic dysfunction of thoracic region: Secondary | ICD-10-CM | POA: Diagnosis not present

## 2022-02-01 DIAGNOSIS — M5137 Other intervertebral disc degeneration, lumbosacral region: Secondary | ICD-10-CM | POA: Diagnosis not present

## 2022-02-01 DIAGNOSIS — M9903 Segmental and somatic dysfunction of lumbar region: Secondary | ICD-10-CM | POA: Diagnosis not present

## 2022-02-22 DIAGNOSIS — G4733 Obstructive sleep apnea (adult) (pediatric): Secondary | ICD-10-CM | POA: Diagnosis not present

## 2022-02-24 ENCOUNTER — Encounter: Payer: Self-pay | Admitting: *Deleted

## 2022-02-24 ENCOUNTER — Ambulatory Visit: Payer: Self-pay | Admitting: *Deleted

## 2022-02-25 NOTE — Patient Outreach (Signed)
  Care Coordination   Follow Up Visit Note   02/25/2022 Name: Dominique Bauer MRN: [id number] DOB: 1946/02/18  Dominique Bauer is a 76 y.o. year old female who sees Hasanaj, Samul Dada, MD for primary care. I spoke with  Galvin Proffer by phone today.  What matters to the patients health and wellness today?  Managing blood sugar    Goals Addressed               This Visit's Progress     Patient Stated     Diabetes Management (pt-stated)        Care Coordination Interventions: Provided education to patient about basic DM disease process Reviewed medications with patient and discussed importance of medication adherence Counseled on importance of regular laboratory monitoring as prescribed Discussed plans with patient for ongoing care management follow up and provided patient with direct contact information for care management team Advised patient, providing education and rationale, to check cbg daily and PRN and record, calling PCP for findings outside established parameters Review of patient status, including review of consultants reports, relevant laboratory and other test results, and medications completed Assessed social determinant of health barriers Discussed improvement in daily blood sugar checks since starting ozempic. Averaging <200 each morning. Recognizes there is room for improvement. Provided verbal education on proper storage of ozempic Discussed side effects of ozempic and management of nausea associated with use. Recommended to avoid fried/greasy foods, eat smaller more frequent meals, limit simple carbs/sugars. This should help to decrease nausea. Denies vomiting.  Advised to talk with PCP if she develops any new or worsening side effects Recommended dietician/nutritionist to help with meal planning. Patient will consider Provided written education material on ADA carb modified diet and blood sugar log Provided with [facility] telephone number and encouraged  to reach out as needed         SDOH assessments and interventions completed:  Yes  SDOH Interventions Today    Flowsheet Row Most Recent Value  SDOH Interventions   Transportation Interventions Intervention Not Indicated  Financial Strain Interventions Intervention Not Indicated        Care Coordination Interventions:  Yes, provided   Follow up plan: Follow up call scheduled for 04/01/2022    Encounter Outcome:  Pt. Visit Completed   Chong Sicilian, BSN, RN-BC RN Care Coordinator [place]: [phone] Main #: [phone]

## 2022-03-01 DIAGNOSIS — M9903 Segmental and somatic dysfunction of lumbar region: Secondary | ICD-10-CM | POA: Diagnosis not present

## 2022-03-01 DIAGNOSIS — M5137 Other intervertebral disc degeneration, lumbosacral region: Secondary | ICD-10-CM | POA: Diagnosis not present

## 2022-03-01 DIAGNOSIS — M9902 Segmental and somatic dysfunction of thoracic region: Secondary | ICD-10-CM | POA: Diagnosis not present

## 2022-03-01 DIAGNOSIS — M9904 Segmental and somatic dysfunction of sacral region: Secondary | ICD-10-CM | POA: Diagnosis not present

## 2022-03-09 ENCOUNTER — Ambulatory Visit: Payer: Medicare Other | Attending: Internal Medicine | Admitting: Internal Medicine

## 2022-03-09 VITALS — BP 138/74 | HR 69 | Ht 67.0 in | Wt 258.8 lb

## 2022-03-09 DIAGNOSIS — I5030 Unspecified diastolic (congestive) heart failure: Secondary | ICD-10-CM

## 2022-03-09 DIAGNOSIS — R001 Bradycardia, unspecified: Secondary | ICD-10-CM | POA: Diagnosis not present

## 2022-03-09 DIAGNOSIS — Z79899 Other long term (current) drug therapy: Secondary | ICD-10-CM | POA: Diagnosis not present

## 2022-03-09 DIAGNOSIS — I4819 Other persistent atrial fibrillation: Secondary | ICD-10-CM

## 2022-03-09 MED ORDER — DILTIAZEM HCL ER COATED BEADS 120 MG PO CP24: 120.0000 mg | ORAL_CAPSULE | Freq: Every day | ORAL | 3 refills | Status: DC

## 2022-03-09 NOTE — Progress Notes (Unsigned)
Patient Care Team: [name], MD as PCP - General (Internal Medicine) [name], MD as PCP - Cardiology (Cardiology)   HPI  Dominique Bauer is a 76 y.o. female Seen in follow-up for atrial fibrillation for which she takes dofetilide since1/18. She was seen in the A. fib clinic 2/18 feeling much better in sinus rhythm.  anticoagulation with Apixoban.      The patient denies chest pain, nocturnal dyspnea, orthopnea.  There have been no  lightheadedness or syncope.  Complains of dyspnea on exertion and some peripheral edema.  Occ palpitations           DATE TEST EF   11/17 Echo   65 %   4/19 Echo   65 % LAE mild mod  5/22 Echo   60-65% LAE Mod      Date Cr K Mg Hgb  2/18  0.72 4.6 1.9   6/18 0.74 4.1 1.6   11/18 0.8 4.6 2.2 13.2  12/19 0.73 4.7 2.1 13.2  10/21 0.82 4.4 1.8   5/23 0.71 4.3        Past Medical History:  Diagnosis Date   Arthritis    osteo arthritis   Body mass index 40.0-44.9, adult ([place])    Diabetes ([place])    Dysrhythmia    H/O measles    H/O mumps    H/O: whooping cough    History of chicken pox    HLD (hyperlipidemia)    Hypertension    Persistent atrial fibrillation ([place])    Sciatica    Sleep apnea    not using aything at night right now/LH    Past Surgical History:  Procedure Laterality Date   ABDOMINAL HYSTERECTOMY     BREAST BIOPSY Right 02/01/2012   BREAST EXCISIONAL BIOPSY Bilateral    benign   CARDIOVERSION N/A 11/17/2015   Procedure: CARDIOVERSION;  Surgeon: [name], MD;  [facility] ENDOSCOPY;  Service: Cardiovascular;  Laterality: N/A;   CARDIOVERSION N/A 01/07/2016   Procedure: CARDIOVERSION;  Surgeon: [name], MD;  Location: [place];  Service: Cardiovascular;  Laterality: N/A;   CARDIOVERSION N/A 04/29/2016   Procedure: CARDIOVERSION;  Surgeon: Sanda [employer]aye [employer], MD;  Location: [place] ENDOSCOPY;  Service: Cardiovascular;  Laterality: N/A;   CATARACT EXTRACTION W/PHACO Left 07/20/2021    Procedure: CATARACT EXTRACTION PHACO AND INTRAOCULAR LENS PLACEMENT ([place]);  Surgeon: [name], MD;  Location: [facility] OR[facility];  Service: Ophthalmology;  Laterality: Left;  CDE: 10.07   CATARACT EXTRACTION W/PHACO Right 08/03/2021   Procedure: CATARACT EXTRACTION PHACO AND INTRAOCULAR LENS PLACEMENT (I[facility]);  Surgeon: [name], MD;  Location: [facility] ORS;  Service: Ophthalmology;  Laterality: Right;  CDE 7.93   DG  BONE DENSITY ([facility] HX)     INNER EAR SURGERY Left    ear drum replacement X2   NASAL SEPTUM SURGERY      Current Outpatient Medications  Medication Sig Dispense Refill   acetaminophen (TYLENOL) 500 MG tablet Take 500-1,000 mg by mouth every 8 (eight) hours as needed for moderate pain.     apixaban (ELIQUIS) 5 MG TABS tablet Take 5 mg by mouth 2 (two) times daily.     Ascorbic Acid (VITAMIN C) 1000 MG tablet Take 1,000 mg by mouth daily.     Calcium Carbonate-Vit D-Min (CALTRATE PLUS PO) Take 1 capsule by mouth in the morning, at noon, and at bedtime.     cetirizine (ZYRTEC) 10 MG tablet Take 10 mg by mouth daily.  dofetilide (TIKOSYN) 125 MCG capsule TAKE THREE CAPSULES BY MOUTH TWICE DAILY (LEAVE in stock bottles) 540 capsule 3   Fluticasone-Salmeterol (ADVAIR) 100-50 MCG/DOSE AEPB Inhale 1 puff into the lungs daily as needed (shortness of breath).     furosemide (LASIX) 40 MG tablet Take 1 tablet (40 mg total) by mouth every other day. as needed x 2 doses for swelling. 30 tablet 8   glimepiride (AMARYL) 2 MG tablet Take 4 mg by mouth in the morning and at bedtime.     guaiFENesin (MUCINEX) 600 MG 12 hr tablet Take 600 mg by mouth daily.     HYDROcodone-acetaminophen (NORCO/VICODIN) 5-325 MG tablet Take 1 tablet by mouth every 6 (six) hours as needed for moderate pain.     L-Lysine 1000 MG TABS Take 1,000 mg by mouth in the morning, at noon, and at bedtime.     levalbuterol (XOPENEX) 0.63 MG/3ML nebulizer solution 1 Ampule(s) Via Nebulizer 4 Times Daily as needed for shortness of  breath/congestion     losartan (COZAAR) 100 MG tablet Take 100 mg by mouth daily.     magnesium oxide (MAG-OX) 400 MG tablet TAKE 1 TABLET BY MOUTH TWICE A DAY 180 tablet 3   metoprolol tartrate (LOPRESSOR) 25 MG tablet Take 12.5 mg by mouth 2 (two) times daily. 1 tablet (25 mg) in the morning and 1/2 tablet (12.5 mg) in the evening     multivitamin-lutein (OCUVITE-LUTEIN) CAPS capsule Take 1 capsule by mouth daily.     Omega-3 Fatty Acids (FISH OIL) 1000 MG CAPS Take 1,000 mg by mouth 3 (three) times daily.      OZEMPIC, 0.25 OR 0.5 MG/DOSE, 2 MG/3ML SOPN Inject 0.5 mg into the skin once a week.     triamcinolone cream (KENALOG) 0.1 % Apply 1 application topically daily as needed. For rash     zinc gluconate 50 MG tablet Take 50 mg by mouth daily.     No current facility-administered medications for this visit.    Allergies  Allergen Reactions   Penicillins Hives    severe   Other     Cigarette smoke and certain perfumes - chest tightening and runny nose   Neosporin [Neomycin-Bacitracin Zn-Polymyx] Rash      Review of Systems negative except from HPI and PMH  Physical Exam   BP 138/74   Pulse 69   Ht '5\' 7"'$  (1.702 m)   Wt 258 lb 12.8 oz (117.4 kg)   SpO2 98%   BMI 40.53 kg/m  Well developed and nourished in no acute distress HENT normal Neck supple with JVP-  6-8 Clear Regular rate and rhythm, no murmurs or gallops Abd-soft with active BS No Clubbing cyanosis edema Skin-warm and dry A & Oriented  Grossly normal sensory and motor function  ECG sinus at 69 Interval 22/10/42 Q waves lead I and L Q wave lead V2    Assessment and  Plan Afib persistent  OSA treated  Hypertension  Sinus Bradycardia  DOE  HFpEF-chronic  High Risk Medication Surveillance  dofetilide     Continue dofetilide '1 2 5 '$ mcg twice daily.  Needs surveillance laboratories.  ECG within the age  No bleeding on Eliquis.  Continues 5 mg twice daily.  Will check CBC.  With significant  fatigue we will have her discontinue her metoprolol as she felt better when she decreased her metoprolol on her own.  Will give her her prescription for diltiazem 120 daily to take as an alternative after about 2 weeks  so as to establish a new off drug baseline  Blood pressure is reasonable.  Should be even better with the diltiazem.  Volume status is good.  She will continue on her furosemide as needed

## 2022-03-09 NOTE — Patient Instructions (Signed)
Medication Instructions:  Your physician has recommended you make the following change in your medication:   ** Stop Metoprolol  ** Begin Diltiazem '120mg'$  - 1 tablet by mouth daily as discussed by Dr [name]  *If you need a refill on your cardiac medications before your next appointment, please call your pharmacy*   Lab Work:  CBC, BMET and Mg today  If you have labs (blood work) drawn today and your tests are completely normal, you will receive your results only by: [place] (if you have MyChart) OR A paper copy in the mail If you have any lab test that is abnormal or we need to change your treatment, we will call you to review the results.   Testing/Procedures: None ordered.    Follow-Up: At [facility], you and your health needs are our priority.  As part of our continuing mission to provide you with exceptional heart care, we have created designated Provider Care Teams.  These Care Teams include your primary Cardiologist (physician) and Advanced Practice Providers (APPs -  Physician Assistants and Nurse Practitioners) who all work together to provide you with the care you need, when you need it.  We recommend signing up for the patient portal called "MyChart".  Sign up information is provided on this After Visit Summary.  MyChart is used to connect with patients for Virtual Visits (Telemedicine).  Patients are able to view lab/test results, encounter notes, upcoming appointments, etc.  Non-urgent messages can be sent to your provider as well.   To learn more about what you can do with MyChart, go to [email or web address].    Your next appointment:   6 months with Dr Olin Pia PA  Important Information About Sugar

## 2022-03-10 DIAGNOSIS — E7849 Other hyperlipidemia: Secondary | ICD-10-CM | POA: Diagnosis not present

## 2022-03-10 DIAGNOSIS — J301 Allergic rhinitis due to pollen: Secondary | ICD-10-CM | POA: Diagnosis not present

## 2022-03-10 DIAGNOSIS — I10 Essential (primary) hypertension: Secondary | ICD-10-CM | POA: Diagnosis not present

## 2022-03-10 DIAGNOSIS — M5459 Other low back pain: Secondary | ICD-10-CM | POA: Diagnosis not present

## 2022-03-10 DIAGNOSIS — E1169 Type 2 diabetes mellitus with other specified complication: Secondary | ICD-10-CM | POA: Diagnosis not present

## 2022-03-10 LAB — BASIC METABOLIC PANEL
BUN/Creatinine Ratio: 18 (ref 12–28)
BUN: 13 mg/dL (ref 8–27)
CO2: 25 mmol/L (ref 20–29)
Calcium: 10.9 mg/dL — ABNORMAL HIGH (ref 8.7–10.3)
Chloride: 100 mmol/L (ref 96–106)
Creatinine, Ser: 0.73 mg/dL (ref 0.57–1.00)
Glucose: 154 mg/dL — ABNORMAL HIGH (ref 70–99)
Potassium: 5 mmol/L (ref 3.5–5.2)
Sodium: 139 mmol/L (ref 134–144)
eGFR: 85 mL/min/{1.73_m2} (ref >59)

## 2022-03-10 LAB — CBC
Hematocrit: 40 % (ref 34.0–46.6)
Hemoglobin: 13.5 g/dL (ref 11.1–15.9)
MCH: 30.1 pg (ref 26.6–33.0)
MCHC: 33.8 g/dL (ref 31.5–35.7)
MCV: 89 fL (ref 79–97)
Platelets: 285 10*3/uL (ref 150–450)
RBC: 4.48 x10E6/uL (ref 3.77–5.28)
RDW: 12.6 % (ref 11.7–15.4)
WBC: 12.5 10*3/uL — ABNORMAL HIGH (ref 3.4–10.8)

## 2022-03-10 LAB — MAGNESIUM: Magnesium: 1.9 mg/dL (ref 1.6–2.3)

## 2022-03-16 ENCOUNTER — Telehealth: Payer: Self-pay

## 2022-03-16 DIAGNOSIS — M79676 Pain in unspecified toe(s): Secondary | ICD-10-CM | POA: Diagnosis not present

## 2022-03-16 DIAGNOSIS — L84 Corns and callosities: Secondary | ICD-10-CM | POA: Diagnosis not present

## 2022-03-16 DIAGNOSIS — B351 Tinea unguium: Secondary | ICD-10-CM | POA: Diagnosis not present

## 2022-03-16 DIAGNOSIS — E1142 Type 2 diabetes mellitus with diabetic polyneuropathy: Secondary | ICD-10-CM | POA: Diagnosis not present

## 2022-03-16 NOTE — Telephone Encounter (Signed)
-----   Message from Deboraha Sprang, MD sent at 03/12/2022  4:01 PM EST ----- Please Inform Patient  Labs are normal x WBC is a little bit elevated and she should have this rechecked with her PCP with differential   Thanks

## 2022-03-16 NOTE — Telephone Encounter (Signed)
Spoke with pt and advised per Dr [name] labs are normal except elevated WBC of 12.5 and will need to follow up with PCP.  Pt verbalizes understanding and agrees with current plan.

## 2022-03-18 DIAGNOSIS — I10 Essential (primary) hypertension: Secondary | ICD-10-CM | POA: Diagnosis not present

## 2022-03-18 DIAGNOSIS — J301 Allergic rhinitis due to pollen: Secondary | ICD-10-CM | POA: Diagnosis not present

## 2022-03-18 DIAGNOSIS — M5459 Other low back pain: Secondary | ICD-10-CM | POA: Diagnosis not present

## 2022-03-18 DIAGNOSIS — E1169 Type 2 diabetes mellitus with other specified complication: Secondary | ICD-10-CM | POA: Diagnosis not present

## 2022-03-23 IMAGING — MG DIGITAL SCREENING BILAT W/ TOMO W/ CAD
8 series · 8 of 24 positions shown · non-contrast
Comparison: Previous exam(s).

CLINICAL DATA: Screening.

EXAM:
DIGITAL SCREENING BILATERAL MAMMOGRAM WITH TOMO AND CAD

[L MLO synth-2D]
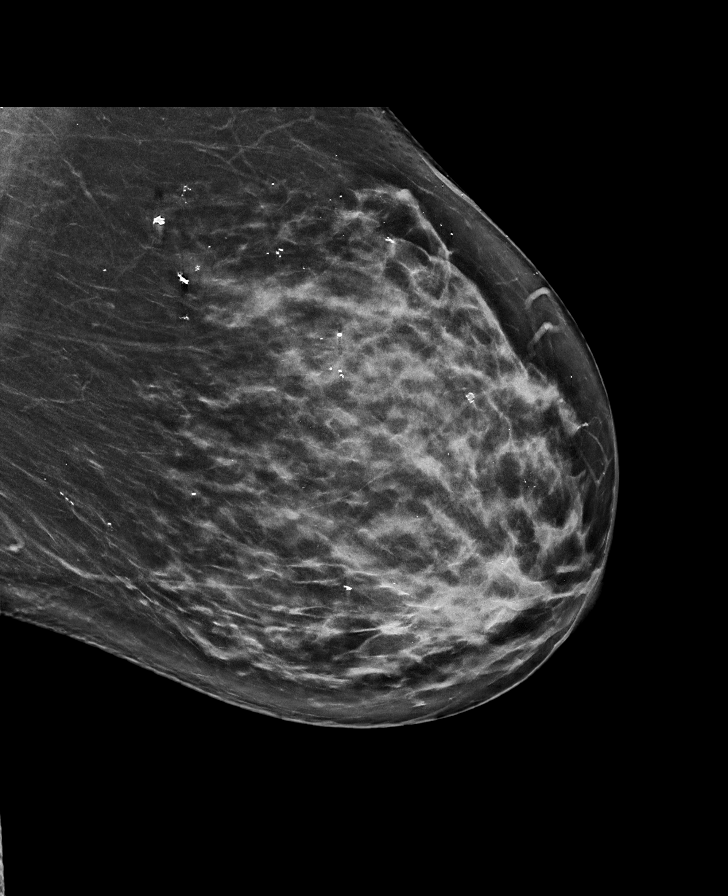

[R MLO synth-2D]
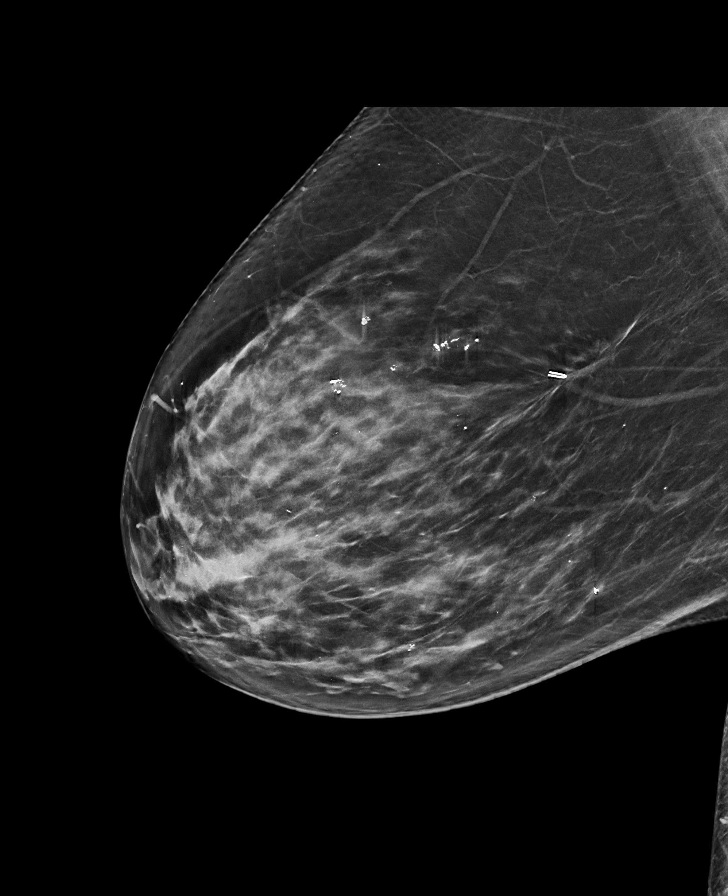

[R CC synth-2D]
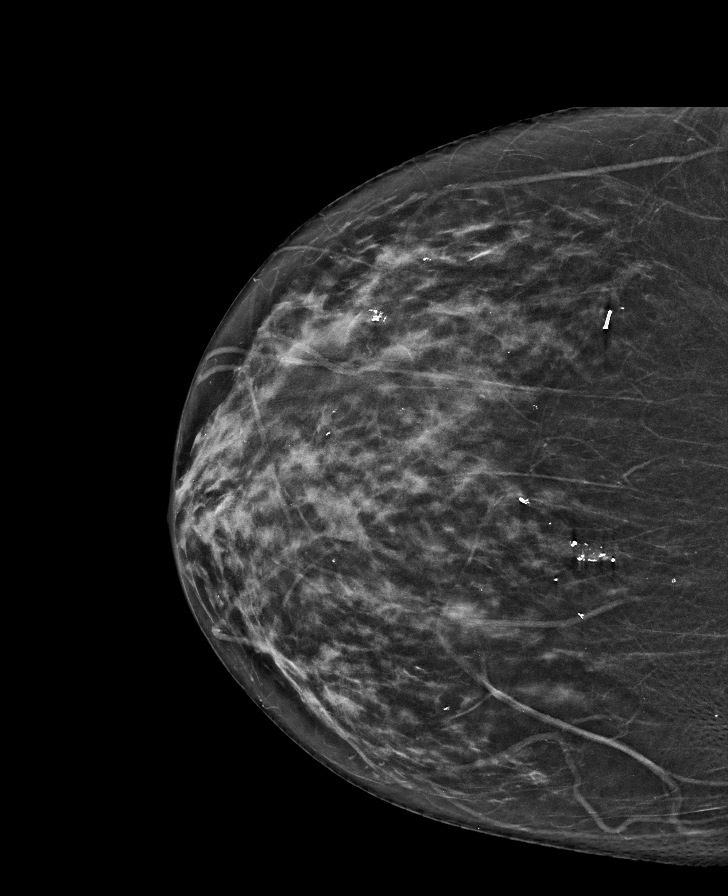

[L CC synth-2D]
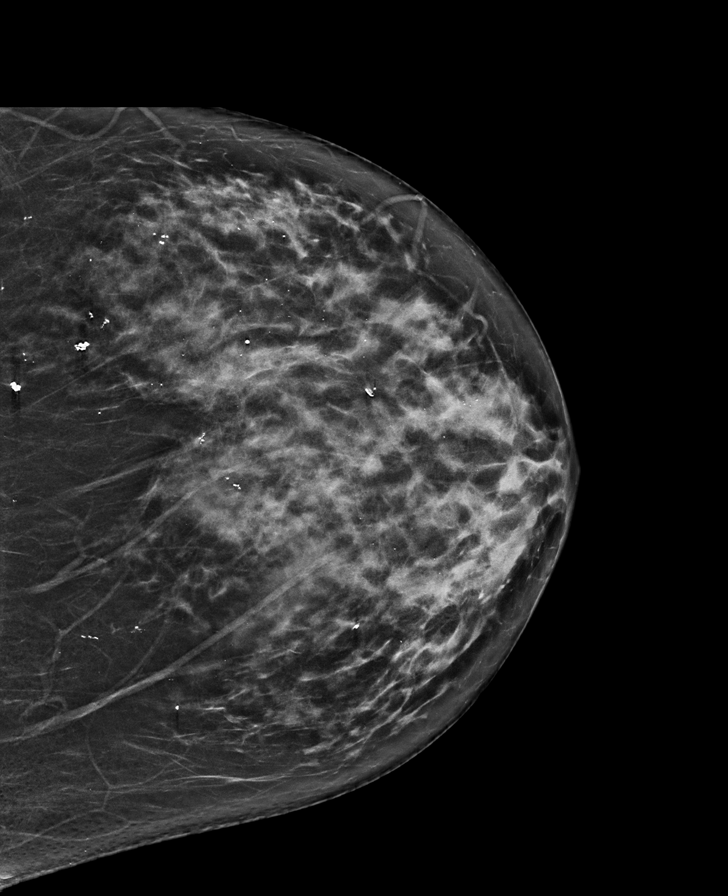

[L CC tomo · tomo slice 34/67.0]
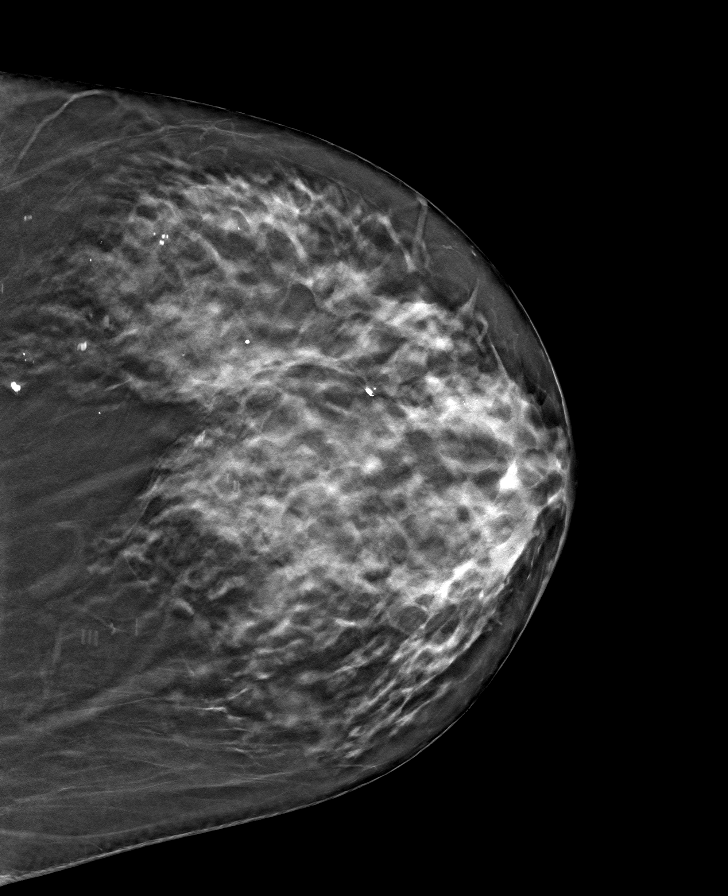

[L MLO tomo · tomo slice 41/82.0]
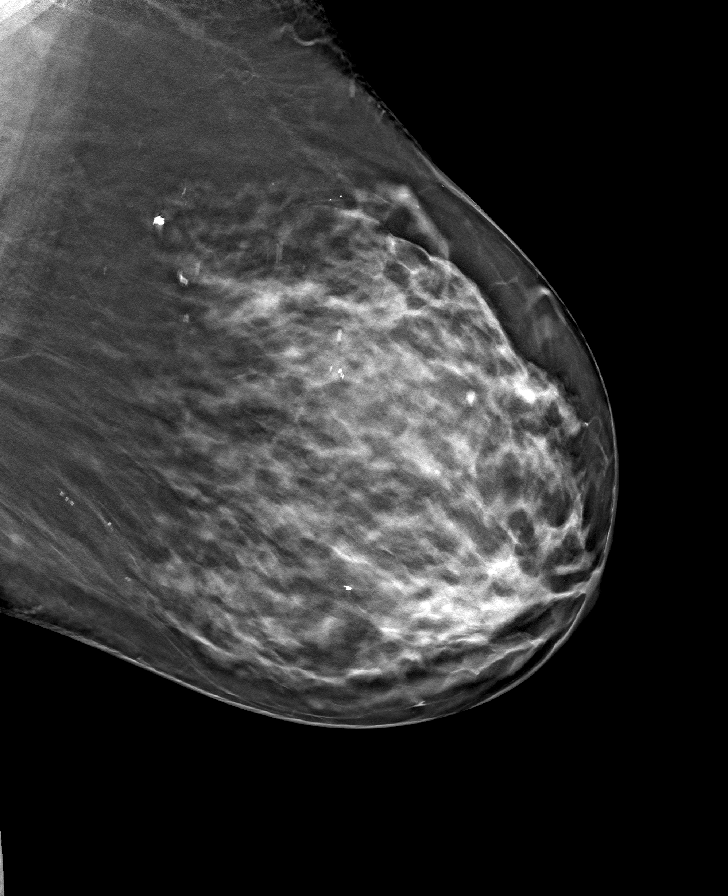

[R CC tomo · tomo slice 31/61.0]
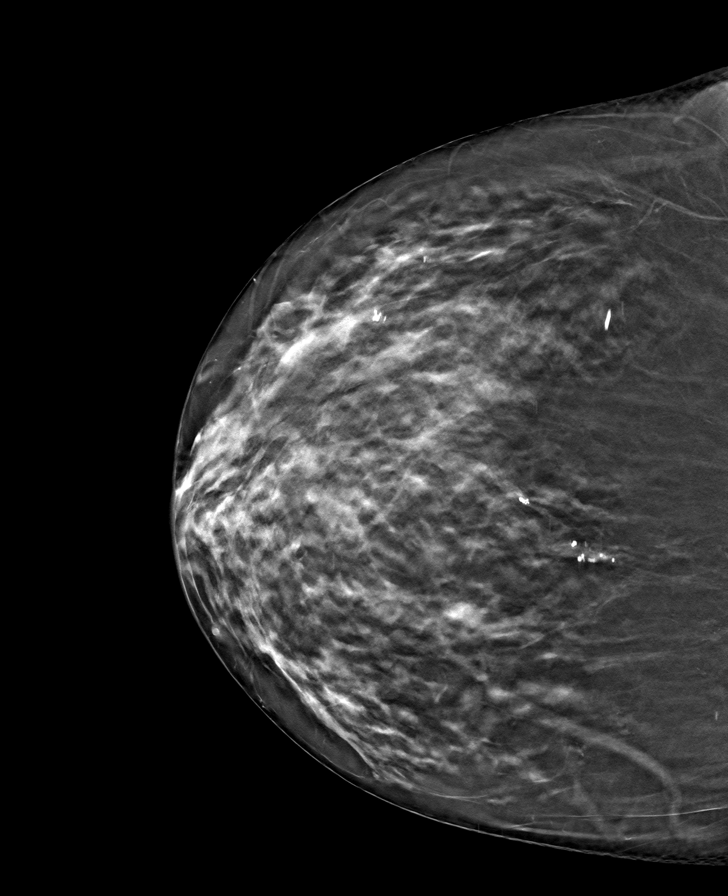

[R MLO tomo · tomo slice 41/80.0]
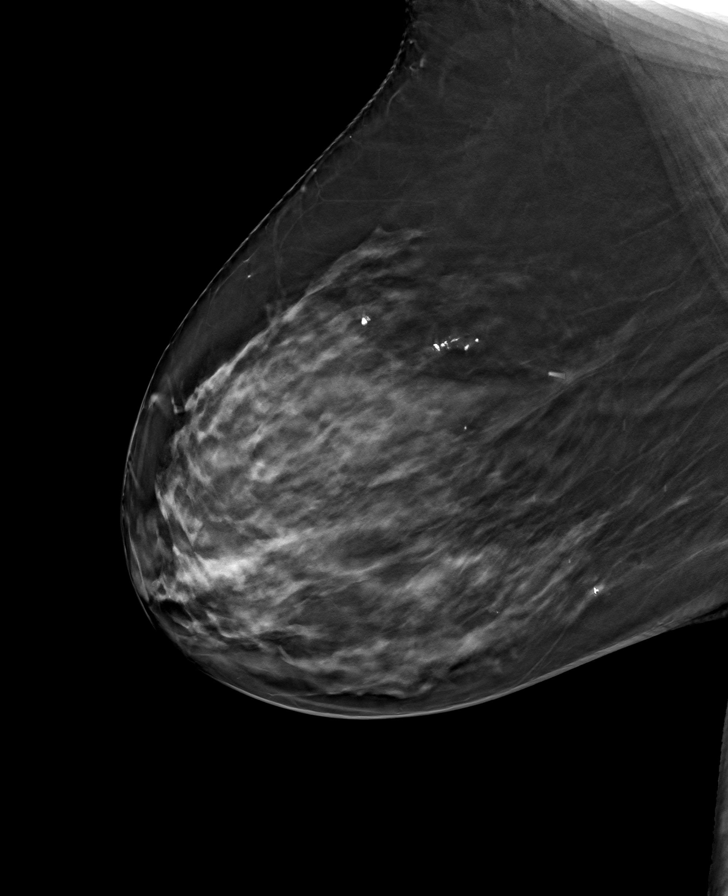

[8 of 24 positions shown; findings below may reference images not displayed]

ACR Breast Density Category c: The breast tissue is heterogeneously
dense, which may obscure small masses.
FINDINGS: There are no findings suspicious for malignancy. Images were
processed with CAD.
IMPRESSION: No mammographic evidence of malignancy. A result letter of this
screening mammogram will be mailed directly to the patient.

RECOMMENDATION:
Screening mammogram in one year. (Code:[id number]-[id number])

BI-RADS CATEGORY  1: Negative.

## 2022-04-01 ENCOUNTER — Ambulatory Visit: Payer: Self-pay | Admitting: *Deleted

## 2022-04-05 ENCOUNTER — Encounter: Payer: Self-pay | Admitting: *Deleted

## 2022-04-05 NOTE — Patient Outreach (Signed)
  Care Coordination   Follow Up Visit Note   04/01/2022 Name: Dominique Bauer MRN: [id number] DOB: 10/20/1945  Dominique Bauer is a 77 y.o. year old female who sees Hasanaj, Samul Dada, MD for primary care. I spoke with  Galvin Proffer by phone today.  What matters to the patients health and wellness today?  Managing blood sugar    Goals Addressed               This Visit's Progress     Patient Stated     Diabetes Management (pt-stated)        Care Coordination Interventions: Reviewed medications with patient and discussed importance of medication adherence Counseled on importance of regular laboratory monitoring as prescribed Discussed plans with patient for ongoing care management follow up and provided patient with direct contact information for care management team Advised patient, providing education and rationale, to check cbg daily and PRN and record, calling PCP for findings outside established parameters Review of patient status, including review of consultants reports, relevant laboratory and other test results, and medications completed Assessed social determinant of health barriers Discussed improvement in daily blood sugar checks since starting ozempic. Averaging <200 each morning. Recognizes there is room for improvement. Discussed side effect of nausea has improved Previously recommended dietician/nutritionist to help with meal planning. Met with one years ago but is not interested in meeting with one at this time Previously provided written education material on ADA carb modified diet and blood sugar log Discussed meal/snack tips and strategies to manage simple carb/non-resistant starch intake Provided with [facility] telephone number and encouraged to reach out as needed         SDOH assessments and interventions completed:  Yes  SDOH Interventions Today    Flowsheet Row Most Recent Value  SDOH Interventions   Transportation Interventions  Intervention Not Indicated  Financial Strain Interventions Intervention Not Indicated        Care Coordination Interventions:  Yes, provided   Follow up plan: Follow up call scheduled for 05/04/22    Encounter Outcome:  Pt. Visit Completed   Chong Sicilian, BSN, RN-BC RN Care Coordinator [place]: [phone] Main #: [phone]

## 2022-04-19 DIAGNOSIS — M5137 Other intervertebral disc degeneration, lumbosacral region: Secondary | ICD-10-CM | POA: Diagnosis not present

## 2022-04-19 DIAGNOSIS — M9904 Segmental and somatic dysfunction of sacral region: Secondary | ICD-10-CM | POA: Diagnosis not present

## 2022-04-19 DIAGNOSIS — M9903 Segmental and somatic dysfunction of lumbar region: Secondary | ICD-10-CM | POA: Diagnosis not present

## 2022-04-19 DIAGNOSIS — M9902 Segmental and somatic dysfunction of thoracic region: Secondary | ICD-10-CM | POA: Diagnosis not present

## 2022-04-22 DIAGNOSIS — E119 Type 2 diabetes mellitus without complications: Secondary | ICD-10-CM | POA: Diagnosis not present

## 2022-05-04 ENCOUNTER — Ambulatory Visit: Payer: Self-pay | Admitting: *Deleted

## 2022-05-04 NOTE — Patient Outreach (Signed)
  Care Coordination   05/04/2022 Name: Dominique Bauer MRN: [id number] DOB: [birth year]   Care Coordination Outreach Attempts:  An unsuccessful telephone outreach was attempted for a scheduled appointment today.  Follow Up Plan:  Additional outreach attempts will be made to offer the patient care coordination information and services. 2nd non-consecutive unsuccessful outreach.  Encounter Outcome:  No Answer   Care Coordination Interventions:  No, not indicated    Chong Sicilian, BSN, RN-BC RN Care Coordinator [place]: [phone] Main #: [phone]

## 2022-05-11 ENCOUNTER — Encounter: Payer: Self-pay | Admitting: *Deleted

## 2022-05-14 ENCOUNTER — Encounter: Payer: Self-pay | Admitting: *Deleted

## 2022-05-17 DIAGNOSIS — M5137 Other intervertebral disc degeneration, lumbosacral region: Secondary | ICD-10-CM | POA: Diagnosis not present

## 2022-05-17 DIAGNOSIS — M9904 Segmental and somatic dysfunction of sacral region: Secondary | ICD-10-CM | POA: Diagnosis not present

## 2022-05-17 DIAGNOSIS — M9902 Segmental and somatic dysfunction of thoracic region: Secondary | ICD-10-CM | POA: Diagnosis not present

## 2022-05-17 DIAGNOSIS — M9903 Segmental and somatic dysfunction of lumbar region: Secondary | ICD-10-CM | POA: Diagnosis not present

## 2022-05-21 ENCOUNTER — Ambulatory Visit: Payer: Self-pay | Admitting: *Deleted

## 2022-05-21 ENCOUNTER — Encounter: Payer: Self-pay | Admitting: *Deleted

## 2022-05-27 DIAGNOSIS — I10 Essential (primary) hypertension: Secondary | ICD-10-CM | POA: Diagnosis not present

## 2022-05-27 DIAGNOSIS — E7849 Other hyperlipidemia: Secondary | ICD-10-CM | POA: Diagnosis not present

## 2022-05-27 DIAGNOSIS — E1169 Type 2 diabetes mellitus with other specified complication: Secondary | ICD-10-CM | POA: Diagnosis not present

## 2022-05-27 DIAGNOSIS — M5459 Other low back pain: Secondary | ICD-10-CM | POA: Diagnosis not present

## 2022-06-08 DIAGNOSIS — M79676 Pain in unspecified toe(s): Secondary | ICD-10-CM | POA: Diagnosis not present

## 2022-06-08 DIAGNOSIS — L84 Corns and callosities: Secondary | ICD-10-CM | POA: Diagnosis not present

## 2022-06-08 DIAGNOSIS — B351 Tinea unguium: Secondary | ICD-10-CM | POA: Diagnosis not present

## 2022-06-09 DIAGNOSIS — E1169 Type 2 diabetes mellitus with other specified complication: Secondary | ICD-10-CM | POA: Diagnosis not present

## 2022-06-09 DIAGNOSIS — M5459 Other low back pain: Secondary | ICD-10-CM | POA: Diagnosis not present

## 2022-06-09 DIAGNOSIS — J301 Allergic rhinitis due to pollen: Secondary | ICD-10-CM | POA: Diagnosis not present

## 2022-06-09 DIAGNOSIS — Z0000 Encounter for general adult medical examination without abnormal findings: Secondary | ICD-10-CM | POA: Diagnosis not present

## 2022-06-09 DIAGNOSIS — I10 Essential (primary) hypertension: Secondary | ICD-10-CM | POA: Diagnosis not present

## 2022-06-09 DIAGNOSIS — E7849 Other hyperlipidemia: Secondary | ICD-10-CM | POA: Diagnosis not present

## 2022-06-14 ENCOUNTER — Encounter (INDEPENDENT_AMBULATORY_CARE_PROVIDER_SITE_OTHER): Payer: Self-pay | Admitting: *Deleted

## 2022-06-14 DIAGNOSIS — M9903 Segmental and somatic dysfunction of lumbar region: Secondary | ICD-10-CM | POA: Diagnosis not present

## 2022-06-14 DIAGNOSIS — M9902 Segmental and somatic dysfunction of thoracic region: Secondary | ICD-10-CM | POA: Diagnosis not present

## 2022-06-14 DIAGNOSIS — M5137 Other intervertebral disc degeneration, lumbosacral region: Secondary | ICD-10-CM | POA: Diagnosis not present

## 2022-06-14 DIAGNOSIS — M9904 Segmental and somatic dysfunction of sacral region: Secondary | ICD-10-CM | POA: Diagnosis not present

## 2022-06-18 ENCOUNTER — Ambulatory Visit: Payer: Self-pay | Admitting: *Deleted

## 2022-06-18 ENCOUNTER — Encounter: Payer: Self-pay | Admitting: *Deleted

## 2022-06-18 NOTE — Patient Outreach (Signed)
  Care Coordination   Follow Up Visit Note   06/18/2022 Name: Meiling Tooks MRN: [id number] DOB: [birth year]  Tina Herringshaw is a 77 y.o. year old female who sees Hasanaj, Samul Dada, MD for primary care. I spoke with  Galvin Proffer by phone today.  What matters to the patients health and wellness today?  Managing blood sugar    Goals Addressed               This Visit's Progress     Patient Stated     Diabetes Management (pt-stated)        Care Coordination Goals: Patient will follow-up with PCP and/or endocrinologist every 3 months or as recommended Patient will take medication as prescribed and reach out to provider with any negative side effects Patient will continue to monitor and record blood sugar 2 times per day and as needed with glucometer, and will call PCP or endocrinologist with any readings outside of recommended range Patient will take blood sugar log and meter to provider visits for review Patient will follow a modified carbohydrate diet and decrease simple carbohydrates and sugars Patient will increase activity level as tolerated with an ultimate goal of at least 150 minutes of exercise per week Patient will check feet daily for sores, wounds, calluses, etc and will notify provider of any abnormal findings Patient will have yearly eye exams to check for or monitor diabetic retinopathy Patient will reach out to [place] Coordinator [phone] with any care coordination or resource needs          SDOH assessments and interventions completed:  Yes  SDOH Interventions Today    Flowsheet Row Most Recent Value  SDOH Interventions   Food Insecurity Interventions Intervention Not Indicated  Transportation Interventions Intervention Not Indicated  Financial Strain Interventions Intervention Not Indicated        Care Coordination Interventions:  Yes, provided   Follow up plan: Follow up call scheduled for 07/19/22    Encounter Outcome:   Pt. Visit Completed   Chong Sicilian, BSN, RN-BC RN Care Coordinator [place]: [phone] Main #: [phone]

## 2022-06-29 DIAGNOSIS — G4733 Obstructive sleep apnea (adult) (pediatric): Secondary | ICD-10-CM | POA: Diagnosis not present

## 2022-07-12 DIAGNOSIS — M9904 Segmental and somatic dysfunction of sacral region: Secondary | ICD-10-CM | POA: Diagnosis not present

## 2022-07-12 DIAGNOSIS — M9902 Segmental and somatic dysfunction of thoracic region: Secondary | ICD-10-CM | POA: Diagnosis not present

## 2022-07-12 DIAGNOSIS — M9903 Segmental and somatic dysfunction of lumbar region: Secondary | ICD-10-CM | POA: Diagnosis not present

## 2022-07-12 DIAGNOSIS — M5137 Other intervertebral disc degeneration, lumbosacral region: Secondary | ICD-10-CM | POA: Diagnosis not present

## 2022-07-19 ENCOUNTER — Ambulatory Visit: Payer: Self-pay | Admitting: *Deleted

## 2022-07-19 ENCOUNTER — Encounter: Payer: Self-pay | Admitting: *Deleted

## 2022-07-19 NOTE — Patient Outreach (Signed)
Care Coordination   Follow Up Visit Note   07/19/2022 Name: Dominique Bauer MRN: [id number] DOB: 06/14/1945  Dominique Bauer is a 77 y.o. year old female who sees Hasanaj, Myra Gianotti, MD for primary care. I spoke with  [name] by phone today.  What matters to the patients health and wellness today?  Managing blood sugar and increasing physical activity level    Goals Addressed               This Visit's Progress     Patient Stated     Diabetes Management (pt-stated)   On track     Care Coordination Goals: Patient will follow-up with PCP every 3 months or as recommended Patient will take medication as prescribed and reach out to provider with any negative side effects Started on Amaryl  BID. PCP discontinued ozempic due to nausea and other side effects that were getting worse. Feels better now that she has been off of it for two weeks Patient will continue to monitor and record blood sugar 2 times per day and as needed with glucometer, and will call PCP with any readings outside of recommended range Patient will take blood sugar log and meter to provider visits for review Patient will follow a modified carbohydrate diet and decrease simple carbohydrates and sugars Patient will increase activity level as tolerated with an ultimate goal of at least 150 minutes of exercise per week Patient will check feet daily for sores, wounds, calluses, etc and will notify provider of any abnormal findings Patient will have yearly eye exams to check for or monitor diabetic retinopathy Patient will reach out to RN Care Coordinator [phone] with any care coordination or resource needs        Other     Increase Physical Activity Level        Care Coordination Goals Patient will consider using her Silver Sneakers benefit through her insurance company at the [employer] exercises since she has osteoarthritis and chronic joint pain Patient will increase  activity level slowly with an ultimate goal of at least 150 minutes of moderate activity per week Patient will reach out to RN Care Coordinator [phone] with any resource or care coordination needs        SDOH assessments and interventions completed:  Yes  SDOH Interventions Today    Flowsheet Row Most Recent Value  SDOH Interventions   Transportation Interventions Intervention Not Indicated  Physical Activity Interventions [facility] [employer]        Care Coordination Interventions:  Yes, provided  Interventions Today    Flowsheet Row Most Recent Value  Chronic Disease   Chronic disease during today's visit Diabetes  General Interventions   General Interventions Discussed/Reviewed General Interventions Discussed, General Interventions Reviewed, Labs, Doctor Visits  Labs Hgb A1c every 3 months  Doctor Visits Discussed/Reviewed Doctor Visits Discussed, Doctor Visits Reviewed, PCP  PCP/Specialist Visits Compliance with follow-up visit  Exercise Interventions   Exercise Discussed/Reviewed Physical Activity, Exercise Reviewed, Exercise Discussed  Physical Activity Discussed/Reviewed Physical Activity Discussed, Physical Activity Reviewed, Gym, Types of exercise  [recommended water aerobics at [place] [employer] Interventions   Education Provided Provided Education  Provided Verbal Education On [employer], When to see the doctor, Exercise, Blood Sugar Monitoring, Mental Health/Coping with Illness, Nutrition  [Exercise benefit through insurance company]  Nutrition Interventions   Nutrition Discussed/Reviewed Nutrition Discussed, Nutrition Reviewed, Carbohydrate meal planning, Portion sizes, Decreasing sugar intake  Pharmacy Interventions   Pharmacy Dicussed/Reviewed  Medications and their functions  Safety Interventions   Safety Discussed/Reviewed Safety Discussed       Follow up plan: Follow up call scheduled for 08/25/22    Encounter Outcome:  Pt. Visit Completed

## 2022-07-26 ENCOUNTER — Telehealth (INDEPENDENT_AMBULATORY_CARE_PROVIDER_SITE_OTHER): Payer: Self-pay | Admitting: Gastroenterology

## 2022-07-26 NOTE — Telephone Encounter (Signed)
Room 3 , Clearance to hold Eliquis, 2 day prep Thanks

## 2022-07-26 NOTE — Telephone Encounter (Signed)
Who is your primary care physician: Dr.Hasanaj  Reasons for the colonoscopy: Screening  Have you had a colonoscopy before?  yes  Do you have family history of colon cancer? Yes (aunt, grandfather)  Previous colonoscopy with polyps removed? no  Do you have a history colorectal cancer?   no  Are you diabetic? If yes, Type 1 or Type 2?    Yes type 2  Do you have a prosthetic or mechanical heart valve? no  Do you have a pacemaker/defibrillator?   no  Have you had endocarditis/atrial fibrillation? Yes (atrial fibrillation)  Have you had joint replacement within the last 12 months?  no  Do you tend to be constipated or have to use laxatives? No (rarely)  Do you have any history of drugs or alchohol?  no  Do you use supplemental oxygen?  no  Have you had a stroke or heart attack within the last 6 months? no  Do you take weight loss medication?  no  For female patients: have you had a hysterectomy?  yes                                     are you post menopausal?       yes                                            do you still have your menstrual cycle? no      Do you take any blood-thinning medications such as: (aspirin, warfarin, Plavix, Aggrenox)  yes   If yes we need the name, milligram, dosage and who is prescribing doctor eliquis 5 mg one by mouth twice daily Current Outpatient Medications on File Prior to Visit  Medication Sig Dispense Refill   acetaminophen (TYLENOL) 500 MG tablet Take 500-1,000 mg by mouth every 8 (eight) hours as needed for moderate pain.     apixaban (ELIQUIS) 5 MG TABS tablet Take 5 mg by mouth 2 (two) times daily.     Ascorbic Acid (VITAMIN C) 1000 MG tablet Take 1,000 mg by mouth daily.     Calcium Carbonate-Vit D-Min (CALTRATE PLUS PO) Take 1 capsule by mouth in the morning, at noon, and at bedtime.     cetirizine (ZYRTEC) 10 MG tablet Take 10 mg by mouth daily.     diltiazem (CARDIZEM CD) 120 MG 24 hr capsule Take 1 capsule (120 mg total) by  mouth daily. 90 capsule 3   dofetilide (TIKOSYN) 125 MCG capsule TAKE THREE CAPSULES BY MOUTH TWICE DAILY (LEAVE in stock bottles) 540 capsule 3   Fluticasone-Salmeterol (ADVAIR) 100-50 MCG/DOSE AEPB Inhale 1 puff into the lungs daily as needed (shortness of breath).     furosemide (LASIX) 40 MG tablet Take 1 tablet (40 mg total) by mouth every other day. as needed x 2 doses for swelling. 30 tablet 8   glimepiride (AMARYL) 2 MG tablet Take 4 mg by mouth in the morning and at bedtime.     guaiFENesin (MUCINEX) 600 MG 12 hr tablet Take 600 mg by mouth daily.     HYDROcodone-acetaminophen (NORCO/VICODIN) 5-325 MG tablet Take 1 tablet by mouth every 6 (six) hours as needed for moderate pain.     L-Lysine 1000 MG TABS Take 1,000 mg by mouth in the morning, at noon, and  at bedtime.     levalbuterol (XOPENEX) 0.63 MG/3ML nebulizer solution 1 Ampule(s) Via Nebulizer 4 Times Daily as needed for shortness of breath/congestion     losartan (COZAAR) 100 MG tablet Take 100 mg by mouth daily.     magnesium oxide (MAG-OX) 400 MG tablet TAKE 1 TABLET BY MOUTH TWICE A DAY 180 tablet 3   metoprolol tartrate (LOPRESSOR) 25 MG tablet Take by mouth.     multivitamin-lutein (OCUVITE-LUTEIN) CAPS capsule Take 1 capsule by mouth daily.     Omega-3 Fatty Acids (FISH OIL) 1000 MG CAPS Take 1,000 mg by mouth 3 (three) times daily.      OZEMPIC, 0.25 OR 0.5 MG/DOSE, 2 MG/3ML SOPN Inject 0.5 mg into the skin once a week.     triamcinolone cream (KENALOG) 0.1 % Apply 1 application topically daily as needed. For rash     zinc gluconate 50 MG tablet Take 50 mg by mouth daily.     No current facility-administered medications on file prior to visit.    Allergies  Allergen Reactions   Penicillins Hives    severe   Other     Cigarette smoke and certain perfumes - chest tightening and runny nose   Neosporin [Neomycin-Bacitracin Zn-Polymyx] Rash     Pharmacy: [facility]  Primary Insurance Name: [employer] number where you can be reached: [phone]

## 2022-07-28 ENCOUNTER — Telehealth (INDEPENDENT_AMBULATORY_CARE_PROVIDER_SITE_OTHER): Payer: Self-pay | Admitting: Gastroenterology

## 2022-07-28 NOTE — Telephone Encounter (Signed)
    07/28/22  Dominique Bauer Jan 08, 1946  What type of surgery is being performed? Colonoscopy  When is surgery scheduled? TBD  Clearance to hold Eliquis   Name of physician performing surgery?  Dr. Katrinka Blazing [facility] at [facility] Phone: [phone] Fax: [phone]  Anethesia type (none, local, MAC, general)? MAC

## 2022-07-28 NOTE — Telephone Encounter (Signed)
Pharmacy please advise on holding Eliquis prior to colonoscopy scheduled for TBD. Thank you.   

## 2022-07-28 NOTE — Telephone Encounter (Signed)
Patient Name: Dominique Bauer  DOB: [birth year] MRN: [id number]   Primary Cardiologist: Charlton Haws, MD   Clinical pharmacists have reviewed the patient's past medical history, labs, and current medications as part of preoperative protocol coverage. The following recommendations have been made:     Per office protocol, patient can hold Eliquis for 1-2 days prior to procedure.     I will route this recommendation to the requesting party via Epic fax function and remove from pre-op pool.   Please call with questions.   Napoleon Form, Leodis Rains, NP 07/28/2022, 1:31 PM    Pt contacted and scheduled for 09/20/22 at 9:30. Pt states she is no longer on Ozempic and her PCP has given her 2 shots to help with the 2 days she is to be off blood thinner. Pt still has prep from previously scheduled colonoscopy.  Instructions mailed to patient. Will call pt with pre op appt  Per [facility] Notification or Prior Authorization is not required for the requested services You are not required to submit a notification/prior authorization based on the information provided. If you have general questions about the prior authorization requirements, visit [email or web address] > Clinician Resources > Advance and Admission Notification Requirements. The number above acknowledges your notification. Please write this reference number down for future reference. If you would like to request an organization determination, please call us at [phone]. Decision ID #: [id number]

## 2022-07-28 NOTE — Telephone Encounter (Signed)
Patient with diagnosis of afib on Eliquis for anticoagulation.    Procedure: colonoscopy Date of procedure: TBD  CHA2DS2-VASc Score = 6  This indicates a 9.7% annual risk of stroke. The patient's score is based upon: CHF History: 1 HTN History: 1 Diabetes History: 1 Stroke History: 0 Vascular Disease History: 0 Age Score: 2 Gender Score: 1  CrCl 70mL/min using adj body weight Platelet count 285K  Per office protocol, patient can hold Eliquis for 1-2 days prior to procedure.    **This guidance is not considered finalized until pre-operative APP has relayed final recommendations.**

## 2022-07-28 NOTE — Telephone Encounter (Signed)
Medication clearance sent  

## 2022-07-28 NOTE — Telephone Encounter (Signed)
   Patient Name: Dominique Bauer  DOB: 1945-06-22 MRN: [id number]  Primary Cardiologist: Charlton Haws, MD  Clinical pharmacists have reviewed the patient's past medical history, labs, and current medications as part of preoperative protocol coverage. The following recommendations have been made:   Per office protocol, patient can hold Eliquis for 1-2 days prior to procedure.    I will route this recommendation to the requesting party via Epic fax function and remove from pre-op pool.  Please call with questions.  Napoleon Form, Leodis Rains, NP 07/28/2022, 1:31 PM

## 2022-08-02 ENCOUNTER — Encounter (INDEPENDENT_AMBULATORY_CARE_PROVIDER_SITE_OTHER): Payer: Self-pay | Admitting: *Deleted

## 2022-08-02 NOTE — Telephone Encounter (Signed)
Referral completed

## 2022-08-11 ENCOUNTER — Encounter (INDEPENDENT_AMBULATORY_CARE_PROVIDER_SITE_OTHER): Payer: Self-pay

## 2022-08-16 DIAGNOSIS — M9902 Segmental and somatic dysfunction of thoracic region: Secondary | ICD-10-CM | POA: Diagnosis not present

## 2022-08-16 DIAGNOSIS — M5137 Other intervertebral disc degeneration, lumbosacral region: Secondary | ICD-10-CM | POA: Diagnosis not present

## 2022-08-16 DIAGNOSIS — M9903 Segmental and somatic dysfunction of lumbar region: Secondary | ICD-10-CM | POA: Diagnosis not present

## 2022-08-16 DIAGNOSIS — M9904 Segmental and somatic dysfunction of sacral region: Secondary | ICD-10-CM | POA: Diagnosis not present

## 2022-08-17 DIAGNOSIS — B351 Tinea unguium: Secondary | ICD-10-CM | POA: Diagnosis not present

## 2022-08-17 DIAGNOSIS — M79676 Pain in unspecified toe(s): Secondary | ICD-10-CM | POA: Diagnosis not present

## 2022-08-25 ENCOUNTER — Ambulatory Visit: Payer: Self-pay | Admitting: *Deleted

## 2022-08-26 ENCOUNTER — Encounter: Payer: Self-pay | Admitting: *Deleted

## 2022-08-26 NOTE — Patient Outreach (Signed)
Care Coordination   Follow Up Visit Note   08/25/2022 Name: Dominique Bauer MRN: [id number] DOB: [birth year]  Dominique Bauer is a 77 y.o. year old female who sees Hasanaj, Myra Gianotti, MD for primary care. I spoke with  [name] by phone today.  What matters to the patients health and wellness today?  Managing blood sugar    Goals Addressed               This Visit's Progress     Patient Stated     Diabetes Management (pt-stated)   On track     Care Coordination Goals: Patient will follow-up with PCP every 3 months or as recommended Patient will take medication as prescribed and reach out to provider with any negative side effects Started on Amaryl 4mg  BID. PCP discontinued ozempic due to nausea and other side effects that were getting worse. Feels better and no side effects from Amaryl.  Patient will continue to monitor and record blood sugar 2 times per day and as needed with glucometer, and will call PCP with any readings outside of recommended range Patient will take blood sugar log and meter to provider visits for review Patient will follow a modified carbohydrate diet and decrease simple carbohydrates and sugars Patient will increase activity level as tolerated with an ultimate goal of at least 150 minutes of exercise per week Patient will check feet daily for sores, wounds, calluses, etc and will notify provider of any abnormal findings Patient will have yearly eye exams to check for or monitor diabetic retinopathy Patient will reach out to RN Care Coordinator [phone] with any care coordination or resource needs        Other     Increase Physical Activity Level   Not on track     Care Coordination Goals Patient will consider using her Silver Sneakers benefit through her insurance company at the [employer] exercises since she has osteoarthritis and chronic joint pain Patient has not looked into this but is considering Patient  will increase activity level slowly with an ultimate goal of at least 150 minutes of moderate activity per week Patient will reach out to RN Care Coordinator [phone] with any resource or care coordination needs        SDOH assessments and interventions completed:  Yes  SDOH Interventions Today    Flowsheet Row Most Recent Value  SDOH Interventions   Transportation Interventions Intervention Not Indicated  Financial Strain Interventions Intervention Not Indicated        Care Coordination Interventions:  Yes, provided  Interventions Today    Flowsheet Row Most Recent Value  Chronic Disease   Chronic disease during today's visit Diabetes  General Interventions   General Interventions Discussed/Reviewed General Interventions Discussed, General Interventions Reviewed, Labs, Annual Foot Exam, Durable Medical Equipment (DME), Annual Eye Exam, Doctor Visits  Labs Hgb A1c every 3 months, Kidney Function  Doctor Visits Discussed/Reviewed Doctor Visits Discussed, Doctor Visits Reviewed, Annual Wellness Visits, PCP  Durable Medical Equipment (DME) Glucomoter  PCP/Specialist Visits Compliance with follow-up visit  Exercise Interventions   Exercise Discussed/Reviewed Exercise Discussed, Exercise Reviewed, Physical Activity  Physical Activity Discussed/Reviewed Physical Activity Discussed, Physical Activity Reviewed, Gym  [recomended local [employer]. Water aerobics would be helpful to decrease pressure on joints]  Education Interventions   Education Provided Provided Education  Provided Verbal Education On Nutrition, When to see the doctor, Exercise, Blood Sugar Monitoring, Eye Care, Medication  [Follow-up with PCP re: continued elevated  blood sugar. Again discussed diet. Patient aware and acknowledges what she needs to change. Declined printed materials.]  Nutrition Interventions   Nutrition Discussed/Reviewed Nutrition Discussed, Nutrition Reviewed, Carbohydrate meal planning, Portion sizes,  Decreasing sugar intake  [patient is aware of diet recommendations but has difficulty with following guidelines. declined additional resources]  Pharmacy Interventions   Pharmacy Dicussed/Reviewed Pharmacy Topics Discussed, Pharmacy Topics Reviewed, Medications and their functions  Safety Interventions   Safety Discussed/Reviewed Safety Discussed, Safety Reviewed       Follow up plan: Follow up call scheduled for 10/06/22    Encounter Outcome:  Pt. Visit Completed   Demetrios Loll, BSN, RN-BC RN Care Coordinator [facility]  [facility] Direct Dial: [phone] Main #: [phone]

## 2022-09-08 DIAGNOSIS — E1169 Type 2 diabetes mellitus with other specified complication: Secondary | ICD-10-CM | POA: Diagnosis not present

## 2022-09-08 DIAGNOSIS — M5459 Other low back pain: Secondary | ICD-10-CM | POA: Diagnosis not present

## 2022-09-08 DIAGNOSIS — E7849 Other hyperlipidemia: Secondary | ICD-10-CM | POA: Diagnosis not present

## 2022-09-08 DIAGNOSIS — J301 Allergic rhinitis due to pollen: Secondary | ICD-10-CM | POA: Diagnosis not present

## 2022-09-08 DIAGNOSIS — I10 Essential (primary) hypertension: Secondary | ICD-10-CM | POA: Diagnosis not present

## 2022-09-11 NOTE — Progress Notes (Unsigned)
Cardiology Office Note Date:  09/11/2022  Patient ID:  Dominique, Bauer 1945-04-01, MRN [id number] PCP:  Toma Deiters, MD  Cardiologist:  Dr. Eden Emms Electrophysiologist: Dr. Graciela Husbands     Chief Complaint: 69mo  History of Present Illness: Dominique Bauer is a 77 y.o. female with history of AFib, HTN, sleep apnea w/CPAP, HFpEF, chronic CHF, very remote DVT (post calcaneal fracture/casting)  I saw her Feb 2023 She tells me she does not feel great, but not new, and feels is 2/2 her weight and DM. She denies any CP, palpitations or cardiac awareness She does not think she has had Afib since on tikosyn. She has DOE with heavier activities, increased pace, stairs, this is her baseline for years, unchanged Uses her spironolactone PRN, infrequently, lasix also PRN, uses now and then No near syncope or syncope No bleeding No changes were made  She saw Dr. Graciela Husbands 03/09/22, c/o fatigue and BB was stopped started on diltiazem  TODAY She is doing well Continues to be very happy with her Afib management, doesn't think she has had any in years. No CP, palpitations or cardiac awareness She has some degree of baseline DOE that is unchanged for years. No dizzy spells, near syncope or syncope. No bleeding or signs of bleeding She does not exercise, though no difficulties with her ADLs, groceries, laundry, house chores  She is pending an colonoscopy, her PMD has recommended and prescribed an injection for the 2 days off her Eliquis, as far as she knows she will not miss both Tikosyn doses day of her colonoscopy  AAD Hx Diagnosed 2017 Flecainide failed to maintain SR, 2017 Tikosyn (started 2018)  Past Medical History:  Diagnosis Date   Arthritis    osteo arthritis   Body mass index 40.0-44.9, adult (HCC)    Diabetes (HCC)    Dysrhythmia    H/O measles    H/O mumps    H/O: whooping cough    History of chicken pox    HLD (hyperlipidemia)    Hypertension    Persistent  atrial fibrillation (HCC)    Sciatica    Sleep apnea    not using aything at night right now/LH    Past Surgical History:  Procedure Laterality Date   ABDOMINAL HYSTERECTOMY     BREAST BIOPSY Right 02/01/2012   BREAST EXCISIONAL BIOPSY Bilateral    benign   CARDIOVERSION N/A 11/17/2015   Procedure: CARDIOVERSION;  Surgeon: Wendall Stade, MD;  Location: [facility] ENDOSCOPY;  Service: Cardiovascular;  Laterality: N/A;   CARDIOVERSION N/A 01/07/2016   Procedure: CARDIOVERSION;  Surgeon: Wendall Stade, MD;  Location: [facility] ENDOSCOPY;  Service: Cardiovascular;  Laterality: N/A;   CARDIOVERSION N/A 04/29/2016   Procedure: CARDIOVERSION;  Surgeon: Thurmon Fair, MD;  Location: [facility] ENDOSCOPY;  Service: Cardiovascular;  Laterality: N/A;   CATARACT EXTRACTION W/PHACO Left 07/20/2021   Procedure: CATARACT EXTRACTION PHACO AND INTRAOCULAR LENS PLACEMENT (IOC);  Surgeon: Fabio Pierce, MD;  Location: [facility] ORS;  Service: Ophthalmology;  Laterality: Left;  CDE: 10.07   CATARACT EXTRACTION W/PHACO Right 08/03/2021   Procedure: CATARACT EXTRACTION PHACO AND INTRAOCULAR LENS PLACEMENT (IOC);  Surgeon: Fabio Pierce, MD;  Location: [facility] ORS;  Service: Ophthalmology;  Laterality: Right;  CDE 7.93   DG  BONE DENSITY ([facility] HX)     INNER EAR SURGERY Left    ear drum replacement X2   NASAL SEPTUM SURGERY      Current Outpatient Medications  Medication Sig Dispense Refill   acetaminophen (TYLENOL)  500 MG tablet Take 500-1,000 mg by mouth every 8 (eight) hours as needed for moderate pain.     apixaban (ELIQUIS) 5 MG TABS tablet Take 5 mg by mouth 2 (two) times daily.     Ascorbic Acid (VITAMIN C) 1000 MG tablet Take 1,000 mg by mouth daily.     Calcium Carbonate-Vit D-Min (CALTRATE PLUS PO) Take 1 capsule by mouth in the morning, at noon, and at bedtime.     cetirizine (ZYRTEC) 10 MG tablet Take 10 mg by mouth daily.     diltiazem (CARDIZEM CD) 120 MG 24 hr capsule Take 1 capsule (120 mg total) by mouth daily. 90  capsule 3   dofetilide (TIKOSYN) 125 MCG capsule TAKE THREE CAPSULES BY MOUTH TWICE DAILY (LEAVE in stock bottles) 540 capsule 3   Fluticasone-Salmeterol (ADVAIR) 100-50 MCG/DOSE AEPB Inhale 1 puff into the lungs daily as needed (shortness of breath).     furosemide (LASIX) 40 MG tablet Take 1 tablet (40 mg total) by mouth every other day. as needed x 2 doses for swelling. 30 tablet 8   glimepiride (AMARYL) 2 MG tablet Take 4 mg by mouth in the morning and at bedtime.     guaiFENesin (MUCINEX) 600 MG 12 hr tablet Take 600 mg by mouth daily.     HYDROcodone-acetaminophen (NORCO/VICODIN) 5-325 MG tablet Take 1 tablet by mouth every 6 (six) hours as needed for moderate pain.     L-Lysine 1000 MG TABS Take 1,000 mg by mouth in the morning, at noon, and at bedtime.     levalbuterol (XOPENEX) 0.63 MG/3ML nebulizer solution 1 Ampule(s) Via Nebulizer 4 Times Daily as needed for shortness of breath/congestion     losartan (COZAAR) 100 MG tablet Take 100 mg by mouth daily.     magnesium oxide (MAG-OX) 400 MG tablet TAKE 1 TABLET BY MOUTH TWICE A DAY 180 tablet 3   metoprolol tartrate (LOPRESSOR) 25 MG tablet Take by mouth.     multivitamin-lutein (OCUVITE-LUTEIN) CAPS capsule Take 1 capsule by mouth daily.     Omega-3 Fatty Acids (FISH OIL) 1000 MG CAPS Take 1,000 mg by mouth 3 (three) times daily.      OZEMPIC, 0.25 OR 0.5 MG/DOSE, 2 MG/3ML SOPN Inject 0.5 mg into the skin once a week.     triamcinolone cream (KENALOG) 0.1 % Apply 1 application topically daily as needed. For rash     zinc gluconate 50 MG tablet Take 50 mg by mouth daily.     No current facility-administered medications for this visit.    Allergies:   Penicillins, Other, and Neosporin [neomycin-bacitracin zn-polymyx]   Social History:  The patient  reports that she has never smoked. She has been exposed to tobacco smoke. She has never used smokeless tobacco. She reports that she does not drink alcohol and does not use drugs.   Family  History:  The patient's family history includes Breast cancer in her maternal aunt and sister; Colon cancer in her maternal grandmother, paternal aunt, and paternal aunt; Hypertension in her mother.  ROS:  Please see the history of present illness.    All other systems are reviewed and otherwise negative.   PHYSICAL EXAM:  VS:  There were no vitals taken for this visit. BMI: There is no height or weight on file to calculate BMI. Well nourished, well developed, in no acute distress HEENT: normocephalic, atraumatic Neck: no JVD, carotid bruits or masses Cardiac:  RRR; no significant murmurs, no rubs, or gallops Lungs: CTA  b/l, no wheezing, rhonchi or rales Abd: soft, nontender MS: no deformity or atrophy Ext: she has compression stocking on her LLE (chronically post DVT years ago), no edema appreciated RLE (reports some chronic swelling LLE since remote DVT) Skin: warm and dry, no rash Neuro:  No gross deficits appreciated Psych: euthymic mood, full affect   EKG:  Done today and reviewed by myself shows  SR 60bpm, QTc , LAD, no significant changes   08/14/2020: TTE IMPRESSIONS   1. Left ventricular ejection fraction, by estimation, is 60 to 65%. The  left ventricle has normal function. The left ventricle has no regional  wall motion abnormalities. The left ventricular internal cavity size was  mildly dilated. Left ventricular  diastolic parameters are consistent with Grade I diastolic dysfunction  (impaired relaxation).   2. Right ventricular systolic function is normal. The right ventricular  size is normal. Tricuspid regurgitation signal is inadequate for assessing  PA pressure.   3. Left atrial size was moderately dilated.   4. Right atrial size was mildly dilated.   5. The mitral valve is normal in structure. No evidence of mitral valve  regurgitation. No evidence of mitral stenosis.   6. The aortic valve is tricuspid. Aortic valve regurgitation is not  visualized. No  aortic stenosis is present.   Comparison(s): No significant change from prior study.    12/04/2015: EST Blood pressure demonstrated a normal response to exercise. There was no ST segment deviation noted during stress. Negative, adequate stress test   Recent Labs: 03/09/2022: BUN 13; Creatinine, Ser 0.73; Hemoglobin 13.5; Magnesium 1.9; Platelets 285; Potassium 5.0; Sodium 139  No results found for requested labs within last 365 days.   CrCl cannot be calculated (Patient's most recent lab result is older than the maximum 21 days allowed.).   Wt Readings from Last 3 Encounters:  03/09/22 258 lb 12.8 oz (117.4 kg)  10/21/21 267 lb 3.2 oz (121.2 kg)  08/12/21 267 lb 9.6 oz (121.4 kg)     Other studies reviewed: Additional studies/records reviewed today include: summarized above  ASSESSMENT AND PLAN:  Paroxysmal Afib CHA2DS2Vasc is 5, on Eliquis, appropriately dosed Tikosyn stable QTc No/low burden by symptoms  Labs today  Her PMD is managing her OAC per-colonoscopy, ?lovenox planned, she is uncertain, is taking a shot in her belly for the 2 days she is off Eliquis.  She sees GI Friday, I have asked that she ensure that she will be able take her Tikosyn day of procedure, one missed dose is acceptable, but if she misses 24 hours (both doses that day), she will need to let us know, understanding we will need to admit to restarted   Chronic CHF HFpEF No exam findings or symptoms of volume OL  HTN Recheck ins 136/70 I have asked her to monitor her BP a few times a week at home and let us/her PMD know if regularly elevated  5. Secondary hypercoagulable state     Disposition: back in 6months otherwise, sooner if needed  Current medicines are reviewed at length with the patient today.  The patient did not have any concerns regarding medicines.  Norma Fredrickson, PA-C 09/11/2022 2:07 PM     [facility] [address] [place] [address] [phone] (office)  [phone] (fax)

## 2022-09-13 ENCOUNTER — Encounter: Payer: Self-pay | Admitting: Physician Assistant

## 2022-09-13 ENCOUNTER — Ambulatory Visit: Payer: Medicare Other | Attending: Physician Assistant | Admitting: Physician Assistant

## 2022-09-13 VITALS — BP 154/84 | HR 60 | Ht 67.0 in | Wt 258.0 lb

## 2022-09-13 DIAGNOSIS — I10 Essential (primary) hypertension: Secondary | ICD-10-CM | POA: Diagnosis not present

## 2022-09-13 DIAGNOSIS — Z79899 Other long term (current) drug therapy: Secondary | ICD-10-CM

## 2022-09-13 DIAGNOSIS — I480 Paroxysmal atrial fibrillation: Secondary | ICD-10-CM | POA: Diagnosis not present

## 2022-09-13 DIAGNOSIS — Z5181 Encounter for therapeutic drug level monitoring: Secondary | ICD-10-CM

## 2022-09-13 DIAGNOSIS — I5032 Chronic diastolic (congestive) heart failure: Secondary | ICD-10-CM | POA: Diagnosis not present

## 2022-09-13 DIAGNOSIS — D6869 Other thrombophilia: Secondary | ICD-10-CM | POA: Diagnosis not present

## 2022-09-13 NOTE — Patient Instructions (Addendum)
Medication Instructions:   Your physician recommends that you continue on your current medications as directed. Please refer to the Current Medication list given to you today.  *If you need a refill on your cardiac medications before your next appointment, please call your pharmacy*   Lab Work:  BMET MAG  AND  CBC TODAY   If you have labs (blood work) drawn today and your tests are completely normal, you will receive your results only by: MyChart Message (if you have MyChart) OR A paper copy in the mail If you have any lab test that is abnormal or we need to change your treatment, we will call you to review the results.   Testing/Procedures: NONE ORDERED  TODAY     Follow-Up: At [facility], you and your health needs are our priority.  As part of our continuing mission to provide you with exceptional heart care, we have created designated Provider Care Teams.  These Care Teams include your primary Cardiologist (physician) and Advanced Practice Providers (APPs -  Physician Assistants and Nurse Practitioners) who all work together to provide you with the care you need, when you need it.  We recommend signing up for the patient portal called "MyChart".  Sign up information is provided on this After Visit Summary.  MyChart is used to connect with patients for Virtual Visits (Telemedicine).  Patients are able to view lab/test results, encounter notes, upcoming appointments, etc.  Non-urgent messages can be sent to your provider as well.   To learn more about what you can do with MyChart, go to [email or web address].    Your next appointment:   6 month(s)  Provider:   Francis Dowse, PA-C    Other Instructions

## 2022-09-14 ENCOUNTER — Telehealth (INDEPENDENT_AMBULATORY_CARE_PROVIDER_SITE_OTHER): Payer: Self-pay | Admitting: Gastroenterology

## 2022-09-14 ENCOUNTER — Telehealth: Payer: Self-pay | Admitting: Physician Assistant

## 2022-09-14 LAB — CBC
Hematocrit: 38.9 % (ref 34.0–46.6)
Hemoglobin: 13.4 g/dL (ref 11.1–15.9)
MCH: 30.4 pg (ref 26.6–33.0)
MCHC: 34.4 g/dL (ref 31.5–35.7)
MCV: 88 fL (ref 79–97)
Platelets: 255 10*3/uL (ref 150–450)
RBC: 4.41 x10E6/uL (ref 3.77–5.28)
RDW: 12.2 % (ref 11.7–15.4)
WBC: 10.7 10*3/uL (ref 3.4–10.8)

## 2022-09-14 LAB — BASIC METABOLIC PANEL
BUN/Creatinine Ratio: 17 (ref 12–28)
BUN: 13 mg/dL (ref 8–27)
CO2: 22 mmol/L (ref 20–29)
Calcium: 10.8 mg/dL — ABNORMAL HIGH (ref 8.7–10.3)
Chloride: 99 mmol/L (ref 96–106)
Creatinine, Ser: 0.75 mg/dL (ref 0.57–1.00)
Glucose: 230 mg/dL — ABNORMAL HIGH (ref 70–99)
Potassium: 4.8 mmol/L (ref 3.5–5.2)
Sodium: 134 mmol/L (ref 134–144)
eGFR: 82 mL/min/{1.73_m2} (ref >59)

## 2022-09-14 LAB — MAGNESIUM: Magnesium: 1.8 mg/dL (ref 1.6–2.3)

## 2022-09-14 NOTE — Telephone Encounter (Signed)
She does not need to stop her Tykosyn.  She was told by her PCP to get injections while off Eliquis. Please remind her to NOT take any shots on the morning of her procedure. Thanks

## 2022-09-14 NOTE — Telephone Encounter (Signed)
Per Cardio-She sees GI Friday, I have asked that she ensure that she will be able take her Tikosyn day of procedure, one missed dose is acceptable, but if she misses 24 hours (both doses that day), she will need to let us know, understanding we will need to admit to restarted   Pt states that cardiology does not want her missing dose.  Is it OK for pt to continue Tikosyn. (Pt has pre op on Friday)  Please advise. Thank you.

## 2022-09-14 NOTE — Telephone Encounter (Signed)
Pt contacted and verbalized understanding.  

## 2022-09-14 NOTE — Telephone Encounter (Signed)
Pt c/o medication issue:  1. Name of Medication: dofetilide (TIKOSYN) 125 MCG capsule   2. How are you currently taking this medication (dosage and times per day)? 3 capsules twice a day  3. Are you having a reaction (difficulty breathing--STAT)? no  4. What is your medication issue? Patient states she was told to call back and speak with Luster Landsberg to discuss her tikosyn. She says she does nt have to stop it for her colonoscopy.

## 2022-09-15 DIAGNOSIS — M9902 Segmental and somatic dysfunction of thoracic region: Secondary | ICD-10-CM | POA: Diagnosis not present

## 2022-09-15 DIAGNOSIS — M5137 Other intervertebral disc degeneration, lumbosacral region: Secondary | ICD-10-CM | POA: Diagnosis not present

## 2022-09-15 DIAGNOSIS — M9903 Segmental and somatic dysfunction of lumbar region: Secondary | ICD-10-CM | POA: Diagnosis not present

## 2022-09-15 DIAGNOSIS — M9904 Segmental and somatic dysfunction of sacral region: Secondary | ICD-10-CM | POA: Diagnosis not present

## 2022-09-15 NOTE — Telephone Encounter (Signed)
Spoke with patient and shared Francis Dowse, PA-C's response regarding her not having to stop Tikosyn for colonoscopy. Per Renee: Excellent, thank you.  Inquired if patient had any other questions or needs to discuss with Renee, patient denies any at this time and expressed appreciation for follow-up.

## 2022-09-17 ENCOUNTER — Encounter (HOSPITAL_COMMUNITY)
Admission: RE | Admit: 2022-09-17 | Discharge: 2022-09-17 | Disposition: A | Discharge status: Home / Self Care | Payer: Medicare Other | Source: Ambulatory Visit | Attending: Gastroenterology | Admitting: Gastroenterology

## 2022-09-17 ENCOUNTER — Encounter (HOSPITAL_COMMUNITY): Payer: Self-pay

## 2022-09-17 HISTORY — DX: Nausea with vomiting, unspecified: R11.2

## 2022-09-17 HISTORY — DX: Other specified postprocedural states: Z98.890

## 2022-09-17 NOTE — Patient Instructions (Addendum)
Your procedure is scheduled on: 09/20/2022  Report to [facility] Main Entrance at  7:30   AM.  Call this number if you have problems the morning of surgery: [phone]   Remember:              Follow Directions on the letter you received from Your Physician's office regarding the Bowel Prep              No Smoking the day of Procedure :   Take these medicines the morning of surgery with A SIP OF WATER: Metoprolol (Lopressor),and  Dofetilide (Tikosyn)  Hold Eliquis 2 days prior, Lovenox bridge as directed   Take only 1/2 dose of lantus night before procedure  No diabetic medication am of procedure   Do not wear jewelry, make-up or nail polish.    Do not bring valuables to the hospital.  Contacts, dentures or bridgework may not be worn into surgery.  .   Patients discharged the day of surgery will not be allowed to drive home.     Colonoscopy, Adult, Care After This sheet gives you information about how to care for yourself after your procedure. Your health care provider may also give you more specific instructions. If you have problems or questions, contact your health care provider. What can I expect after the procedure? After the procedure, it is common to have: A small amount of blood in your stool for 24 hours after the procedure. Some gas. Mild abdominal cramping or bloating.  Follow these instructions at home: General instructions  For the first 24 hours after the procedure: Do not drive or use machinery. Do not sign important documents. Do not drink alcohol. Do your regular daily activities at a slower pace than normal. Eat soft, easy-to-digest foods. Rest often. Take over-the-counter or prescription medicines only as told by your health care provider. It is up to you to get the results of your procedure. Ask your health care provider, or the department performing the procedure, when your results will be ready. Relieving cramping and bloating Try walking  around when you have cramps or feel bloated. Apply heat to your abdomen as told by your health care provider. Use a heat source that your health care provider recommends, such as a moist heat pack or a heating pad. Place a towel between your skin and the heat source. Leave the heat on for 20-30 minutes. Remove the heat if your skin turns bright red. This is especially important if you are unable to feel pain, heat, or cold. You may have a greater risk of getting burned. Eating and drinking Drink enough fluid to keep your urine clear or pale yellow. Resume your normal diet as instructed by your health care provider. Avoid heavy or fried foods that are hard to digest. Avoid drinking alcohol for as long as instructed by your health care provider. Contact a health care provider if: You have blood in your stool 2-3 days after the procedure. Get help right away if: You have more than a small spotting of blood in your stool. You pass large blood clots in your stool. Your abdomen is swollen. You have nausea or vomiting. You have a fever. You have increasing abdominal pain that is not relieved with medicine. This information is not intended to replace advice given to you by your health care provider. Make sure you discuss any questions you have with your health care provider. Document Released: 10/28/2003 Document Revised: 12/08/2015 Document Reviewed: 05/27/2015 Elsevier Interactive  Patient Education  [employer].

## 2022-09-20 ENCOUNTER — Encounter (HOSPITAL_COMMUNITY): Payer: Self-pay | Admitting: Gastroenterology

## 2022-09-20 ENCOUNTER — Encounter (HOSPITAL_COMMUNITY)
Admission: RE | Disposition: A | Discharge status: Home / Self Care | Payer: Self-pay | Source: Home / Self Care | Attending: Gastroenterology

## 2022-09-20 ENCOUNTER — Ambulatory Visit (HOSPITAL_BASED_OUTPATIENT_CLINIC_OR_DEPARTMENT_OTHER): Payer: Medicare Other | Admitting: Anesthesiology

## 2022-09-20 ENCOUNTER — Ambulatory Visit (HOSPITAL_COMMUNITY)
Admission: RE | Admit: 2022-09-20 | Discharge: 2022-09-20 | Disposition: A | Discharge status: Home / Self Care | Payer: Medicare Other | Attending: Gastroenterology | Admitting: Gastroenterology

## 2022-09-20 ENCOUNTER — Ambulatory Visit (HOSPITAL_COMMUNITY): Payer: Medicare Other | Admitting: Anesthesiology

## 2022-09-20 DIAGNOSIS — E785 Hyperlipidemia, unspecified: Secondary | ICD-10-CM | POA: Diagnosis not present

## 2022-09-20 DIAGNOSIS — D126 Benign neoplasm of colon, unspecified: Secondary | ICD-10-CM | POA: Diagnosis not present

## 2022-09-20 DIAGNOSIS — I4891 Unspecified atrial fibrillation: Secondary | ICD-10-CM | POA: Diagnosis not present

## 2022-09-20 DIAGNOSIS — G4733 Obstructive sleep apnea (adult) (pediatric): Secondary | ICD-10-CM | POA: Diagnosis not present

## 2022-09-20 DIAGNOSIS — Z794 Long term (current) use of insulin: Secondary | ICD-10-CM | POA: Diagnosis not present

## 2022-09-20 DIAGNOSIS — K635 Polyp of colon: Secondary | ICD-10-CM | POA: Diagnosis not present

## 2022-09-20 DIAGNOSIS — Z7984 Long term (current) use of oral hypoglycemic drugs: Secondary | ICD-10-CM | POA: Diagnosis not present

## 2022-09-20 DIAGNOSIS — Z1211 Encounter for screening for malignant neoplasm of colon: Secondary | ICD-10-CM | POA: Insufficient documentation

## 2022-09-20 DIAGNOSIS — D123 Benign neoplasm of transverse colon: Secondary | ICD-10-CM | POA: Diagnosis not present

## 2022-09-20 DIAGNOSIS — Z800 Family history of malignant neoplasm of digestive organs: Secondary | ICD-10-CM | POA: Diagnosis not present

## 2022-09-20 DIAGNOSIS — I10 Essential (primary) hypertension: Secondary | ICD-10-CM

## 2022-09-20 DIAGNOSIS — I4819 Other persistent atrial fibrillation: Secondary | ICD-10-CM | POA: Diagnosis not present

## 2022-09-20 DIAGNOSIS — E119 Type 2 diabetes mellitus without complications: Secondary | ICD-10-CM | POA: Diagnosis not present

## 2022-09-20 DIAGNOSIS — K648 Other hemorrhoids: Secondary | ICD-10-CM | POA: Insufficient documentation

## 2022-09-20 DIAGNOSIS — D120 Benign neoplasm of cecum: Secondary | ICD-10-CM | POA: Diagnosis not present

## 2022-09-20 DIAGNOSIS — D122 Benign neoplasm of ascending colon: Secondary | ICD-10-CM | POA: Insufficient documentation

## 2022-09-20 DIAGNOSIS — E1149 Type 2 diabetes mellitus with other diabetic neurological complication: Secondary | ICD-10-CM

## 2022-09-20 DIAGNOSIS — Z139 Encounter for screening, unspecified: Secondary | ICD-10-CM | POA: Diagnosis not present

## 2022-09-20 HISTORY — PX: POLYPECTOMY: SHX5525

## 2022-09-20 HISTORY — PX: COLONOSCOPY WITH PROPOFOL: SHX5780

## 2022-09-20 LAB — HM COLONOSCOPY

## 2022-09-20 LAB — GLUCOSE, CAPILLARY: Glucose-Capillary: 191 mg/dL — ABNORMAL HIGH (ref 70–99)

## 2022-09-20 SURGERY — COLONOSCOPY WITH PROPOFOL
Anesthesia: General

## 2022-09-20 MED ORDER — LIDOCAINE HCL (CARDIAC) PF 100 MG/5ML IV SOSY
PREFILLED_SYRINGE | INTRAVENOUS | Status: DC | PRN
  Administered 2022-09-20: 60 mg via INTRAVENOUS

## 2022-09-20 MED ORDER — LACTATED RINGERS IV SOLN: INTRAVENOUS | Status: DC

## 2022-09-20 MED ORDER — LACTATED RINGERS IV SOLN: INTRAVENOUS | Status: DC | PRN

## 2022-09-20 MED ORDER — PROPOFOL 10 MG/ML IV BOLUS
INTRAVENOUS | Status: DC | PRN
  Administered 2022-09-20: 50 mg via INTRAVENOUS
  Administered 2022-09-20: 40 mg via INTRAVENOUS
  Administered 2022-09-20: 20 mg via INTRAVENOUS
  Administered 2022-09-20: 50 mg via INTRAVENOUS
  Administered 2022-09-20 (×2): 20 mg via INTRAVENOUS
  Administered 2022-09-20: 50 mg via INTRAVENOUS
  Administered 2022-09-20: 100 mg via INTRAVENOUS

## 2022-09-20 NOTE — Anesthesia Preprocedure Evaluation (Signed)
Anesthesia Evaluation  Patient identified by MRN, date of birth, ID band Patient awake    Reviewed: Allergy & Precautions, H&P , NPO status , Patient's Chart, lab work & pertinent test results, reviewed documented beta blocker date and time   History of Anesthesia Complications (+) PONV and history of anesthetic complications  Airway Mallampati: II  TM Distance: >3 FB Neck ROM: full    Dental no notable dental hx.    Pulmonary neg pulmonary ROS, sleep apnea    Pulmonary exam normal breath sounds clear to auscultation       Cardiovascular Exercise Tolerance: Good hypertension, negative cardio ROS + dysrhythmias Atrial Fibrillation  Rhythm:irregular Rate:Normal     Neuro/Psych  Neuromuscular disease negative neurological ROS  negative psych ROS   GI/Hepatic negative GI ROS, Neg liver ROS,,,  Endo/Other  negative endocrine ROSdiabetes    Renal/GU negative Renal ROS  negative genitourinary   Musculoskeletal   Abdominal   Peds  Hematology negative hematology ROS (+)   Anesthesia Other Findings   Reproductive/Obstetrics negative OB ROS                             Anesthesia Physical Anesthesia Plan  ASA: 3  Anesthesia Plan: General   Post-op Pain Management:    Induction:   PONV Risk Score and Plan: Propofol infusion  Airway Management Planned:   Additional Equipment:   Intra-op Plan:   Post-operative Plan:   Informed Consent: I have reviewed the patients History and Physical, chart, labs and discussed the procedure including the risks, benefits and alternatives for the proposed anesthesia with the patient or authorized representative who has indicated his/her understanding and acceptance.     Dental Advisory Given  Plan Discussed with: CRNA  Anesthesia Plan Comments:        Anesthesia Quick Evaluation

## 2022-09-20 NOTE — Transfer of Care (Signed)
Immediate Anesthesia Transfer of Care Note  Patient: Dominique Bauer  Procedure(s) Performed: COLONOSCOPY WITH PROPOFOL POLYPECTOMY  Patient Location: PACU  Anesthesia Type:General  Level of Consciousness: awake, alert , oriented, and patient cooperative  Airway & Oxygen Therapy: Patient Spontanous Breathing and Patient connected to nasal cannula oxygen  Post-op Assessment: Report given to RN, Post -op Vital signs reviewed and stable, and Patient moving all extremities X 4  Post vital signs: Reviewed and stable - VSS, RN to enter data.  Last Vitals:  Vitals Value Taken Time  BP    Temp    Pulse    Resp    SpO2      Last Pain:  Vitals:   09/20/22 0938  TempSrc:   PainSc: 0-No pain         Complications: No notable events documented.

## 2022-09-20 NOTE — Discharge Instructions (Signed)
You are being discharged to home.  Resume your previous diet.  We are waiting for your pathology results.  Your physician has indicated that a repeat colonoscopy is not recommended due to your current age (29 years or older) for screening purposes.  Restart Eliquis tonight, do not use Lovenox.

## 2022-09-20 NOTE — Op Note (Signed)
[facility] Patient Name: Dominique Bauer Procedure Date: 09/20/2022 9:27 AM MRN: [id number] Date of Birth: [birth year] Attending MD: Katrinka Blazing , , [id number] CSN: [id number] Age: 77 Admit Type: Outpatient Procedure:                Colonoscopy Indications:              Screening for colorectal malignant neoplasm Providers:                Katrinka Blazing, Jannett Celestine, RN, Crystal Page,                            Edrick Kins, RN, Dyann Ruddle Referring MD:              Medicines:                Monitored Anesthesia Care Complications:            No immediate complications. Estimated Blood Loss:     Estimated blood loss: none. Procedure:                Pre-Anesthesia Assessment:                           - Prior to the procedure, a History and Physical                            was performed, and patient medications, allergies                            and sensitivities were reviewed. The patient's                            tolerance of previous anesthesia was reviewed.                           - The risks and benefits of the procedure and the                            sedation options and risks were discussed with the                            patient. All questions were answered and informed                            consent was obtained.                           - ASA Grade Assessment: III - A patient with severe                            systemic disease.                           After obtaining informed consent, the colonoscope                            was passed under direct vision. Throughout  the                            procedure, the patient's blood pressure, pulse, and                            oxygen saturations were monitored continuously. The                            [id number]-HQ190L ([id number]) scope was introduced through                            the anus and advanced to the the cecum, identified                            by appendiceal orifice and  ileocecal valve. The                            patient tolerated the procedure well. The                            colonoscopy was performed with difficulty due to                            significant looping. Successful completion of the                            procedure was aided by applying abdominal pressure.                            The quality of the bowel preparation was good. Scope In: 9:43:42 AM Scope Out: 10:19:31 AM Scope Withdrawal Time: 0 hours 21 minutes 11 seconds  Total Procedure Duration: 0 hours 35 minutes 49 seconds  Findings:      The perianal and digital rectal examinations were normal.      Three sessile polyps were found in the transverse colon, ascending colon       and cecum. The polyps were 3 to 5 mm in size. These polyps were removed       with a cold snare. Resection and retrieval were complete.      A 1 mm polyp was found in the ascending colon. The polyp was sessile.       The polyp was removed with a cold biopsy forceps. Resection and       retrieval were complete.      The ascending colon revealed significantly excessive looping. Abdominal       pressure was applied in the R flank and RLQ area to effectively advance       the scope.      Non-bleeding internal hemorrhoids were found during retroflexion. The       hemorrhoids were small. Impression:               - Three 3 to 5 mm polyps in the transverse colon,                            in the ascending colon and  in the cecum, removed                            with a cold snare. Resected and retrieved.                           - One 1 mm polyp in the ascending colon, removed                            with a cold biopsy forceps. Resected and retrieved.                           - There was significant looping of the colon.                           - Non-bleeding internal hemorrhoids. Moderate Sedation:      Per Anesthesia Care Recommendation:           - Discharge patient to home  (ambulatory).                           - Resume previous diet.                           - Await pathology results.                           - Repeat colonoscopy is not recommended due to                            current age (15 years or older) for screening                            purposes.                           - Restart Eliquis tonight, do not use Lovenox. Procedure Code(s):        --- Professional ---                           [id number], Colonoscopy, flexible; with removal of                            tumor(s), polyp(s), or other lesion(s) by snare                            technique                           45380, 59, Colonoscopy, flexible; with biopsy,                            single or multiple Diagnosis Code(s):        --- Professional ---                           Z12.11, Encounter  for screening for malignant                            neoplasm of colon                           D12.3, Benign neoplasm of transverse colon (hepatic                            flexure or splenic flexure)                           D12.2, Benign neoplasm of ascending colon                           D12.0, Benign neoplasm of cecum                           K64.8, Other hemorrhoids CPT copyright 2022 American Medical Association. All rights reserved. The codes documented in this report are preliminary and upon coder review may  be revised to meet current compliance requirements. Katrinka Blazing, MD Katrinka Blazing,  09/20/2022 10:26:46 AM This report has been signed electronically. Number of Addenda: 0

## 2022-09-20 NOTE — Anesthesia Postprocedure Evaluation (Signed)
Anesthesia Post Note  Patient: Dominique Bauer  Procedure(s) Performed: COLONOSCOPY WITH PROPOFOL POLYPECTOMY  Patient location during evaluation: Phase II Anesthesia Type: General Level of consciousness: awake Pain management: pain level controlled Vital Signs Assessment: post-procedure vital signs reviewed and stable Respiratory status: spontaneous breathing and respiratory function stable Cardiovascular status: blood pressure returned to baseline and stable Postop Assessment: no headache and no apparent nausea or vomiting Anesthetic complications: no Comments: Late entry   No notable events documented.   Last Vitals:  Vitals:   09/20/22 0928 09/20/22 1025  BP: (!) 170/65 138/63  Pulse:  71  Resp:  19  Temp:  36.7 C  SpO2:  95%    Last Pain:  Vitals:   09/20/22 1025  TempSrc: Oral  PainSc: 0-No pain                 Windell Norfolk

## 2022-09-20 NOTE — H&P (Signed)
Dominique Bauer is an 77 y.o. female.   Chief Complaint: screening colonoscopy HPI: 77 y/o F with PMH DM, HLD, HTN, OSA, coming for screening colonoscopy.  last colonoscopy was in May 2013 with Dr. Justice Britain. Her colonoscopy was normal at that time The patient denies having any complaints such as melena, hematochezia, abdominal pain or distention, change in her bowel movement consistency or frequency, no changes in weight recently.    She does have family history of colon cancer in her maternal grandfather in his 25s or 31s and 2 paternal aunts in their 84s or 55s.   Past Medical History:  Diagnosis Date   Arthritis    osteo arthritis   Body mass index 40.0-44.9, adult (HCC)    Diabetes (HCC)    Dysrhythmia    H/O measles    H/O mumps    H/O: whooping cough    History of chicken pox    HLD (hyperlipidemia)    Hypertension    Persistent atrial fibrillation (HCC)    PONV (postoperative nausea and vomiting)    Sciatica    Sleep apnea    not using aything at night right now/LH    Past Surgical History:  Procedure Laterality Date   ABDOMINAL HYSTERECTOMY     BREAST BIOPSY Right 02/01/2012   BREAST EXCISIONAL BIOPSY Bilateral    benign   CARDIOVERSION N/A 11/17/2015   Procedure: CARDIOVERSION;  Surgeon: Wendall Stade, MD;  Location: [facility] ENDOSCOPY;  Service: Cardiovascular;  Laterality: N/A;   CARDIOVERSION N/A 01/07/2016   Procedure: CARDIOVERSION;  Surgeon: Wendall Stade, MD;  Location: [facility] ENDOSCOPY;  Service: Cardiovascular;  Laterality: N/A;   CARDIOVERSION N/A 04/29/2016   Procedure: CARDIOVERSION;  Surgeon: Thurmon Fair, MD;  Location: [facility] ENDOSCOPY;  Service: Cardiovascular;  Laterality: N/A;   CATARACT EXTRACTION W/PHACO Left 07/20/2021   Procedure: CATARACT EXTRACTION PHACO AND INTRAOCULAR LENS PLACEMENT (IOC);  Surgeon: Fabio Pierce, MD;  Location: [facility] ORS;  Service: Ophthalmology;  Laterality: Left;  CDE: 10.07   CATARACT EXTRACTION W/PHACO Right 08/03/2021    Procedure: CATARACT EXTRACTION PHACO AND INTRAOCULAR LENS PLACEMENT (IOC);  Surgeon: Fabio Pierce, MD;  Location: [facility] ORS;  Service: Ophthalmology;  Laterality: Right;  CDE 7.93   DG  BONE DENSITY ([facility] HX)     INNER EAR SURGERY Left    ear drum replacement X2   NASAL SEPTUM SURGERY      Family History  Problem Relation Age of Onset   Hypertension Mother    Breast cancer Sister    Colon cancer Maternal Grandmother        45s-80s   Breast cancer Maternal Aunt    Colon cancer Paternal Aunt        65s-80s   Colon cancer Paternal Aunt        16s-80s   Social History:  reports that she has never smoked. She has been exposed to tobacco smoke. She has never used smokeless tobacco. She reports that she does not drink alcohol and does not use drugs.  Allergies:  Allergies  Allergen Reactions   Penicillins Hives    severe   Other     Cigarette smoke and certain perfumes - chest tightening and runny nose   Neosporin [Neomycin-Bacitracin Zn-Polymyx] Rash    Medications Prior to Admission  Medication Sig Dispense Refill   acetaminophen (TYLENOL) 500 MG tablet Take 500-1,000 mg by mouth every 8 (eight) hours as needed for moderate pain.     apixaban (ELIQUIS) 5 MG TABS tablet  Take 5 mg by mouth 2 (two) times daily.     Calcium Carbonate-Vit D-Min (CALTRATE PLUS PO) Take 1 capsule by mouth in the morning, at noon, and at bedtime.     cetirizine (ZYRTEC) 10 MG tablet Take 10 mg by mouth daily.     cholecalciferol (VITAMIN D3) 25 MCG (1000 UNIT) tablet Take 1,000 Units by mouth daily.     dofetilide (TIKOSYN) 125 MCG capsule TAKE THREE CAPSULES BY MOUTH TWICE DAILY (LEAVE in stock bottles) 540 capsule 3   Fluticasone-Salmeterol (ADVAIR) 100-50 MCG/DOSE AEPB Inhale 1 puff into the lungs daily as needed (shortness of breath).     furosemide (LASIX) 40 MG tablet Take 1 tablet (40 mg total) by mouth every other day. as needed x 2 doses for swelling. (Patient taking differently: Take 40 mg by  mouth daily as needed for fluid or edema.) 30 tablet 8   glimepiride (AMARYL) 2 MG tablet Take 4 mg by mouth in the morning and at bedtime.     guaiFENesin (MUCINEX) 600 MG 12 hr tablet Take 600 mg by mouth daily.     HYDROcodone-acetaminophen (NORCO/VICODIN) 5-325 MG tablet Take 1 tablet by mouth every 6 (six) hours as needed for moderate pain.     insulin glargine (LANTUS) 100 UNIT/ML injection Inject 20 Units into the skin daily.     L-Lysine 500 MG TABS Take 500 mg by mouth in the morning, at noon, and at bedtime.     losartan (COZAAR) 100 MG tablet Take 100 mg by mouth daily.     magnesium oxide (MAG-OX) 400 MG tablet TAKE 1 TABLET BY MOUTH TWICE A DAY 180 tablet 3   metoprolol tartrate (LOPRESSOR) 25 MG tablet Take 12.5-25 mg by mouth See admin instructions. Take 25 mg by mouth in the morning and 12.5 mg in the evening     multivitamin-lutein (OCUVITE-LUTEIN) CAPS capsule Take 1 capsule by mouth daily.     Omega-3 Fatty Acids (FISH OIL) 1000 MG CAPS Take 1,000 mg by mouth 3 (three) times daily.      triamcinolone cream (KENALOG) 0.1 % Apply 1 application  topically daily as needed (rash).     diltiazem (CARDIZEM CD) 120 MG 24 hr capsule Take 1 capsule (120 mg total) by mouth daily. (Patient not taking: Reported on 09/14/2022) 90 capsule 3   levalbuterol (XOPENEX) 0.63 MG/3ML nebulizer solution Take 0.63 mg by nebulization every 6 (six) hours as needed for wheezing or shortness of breath.      Results for orders placed or performed during the hospital encounter of 09/20/22 (from the past 48 hour(s))  Glucose, capillary     Status: Abnormal   Collection Time: 09/20/22  8:44 AM  Result Value Ref Range   Glucose-Capillary 191 (H) 70 - 99 mg/dL    Comment: Glucose reference range applies only to samples taken after fasting for at least 8 hours.   No results found.  Review of Systems  All other systems reviewed and are negative.   Blood pressure (!) 208/90, pulse 82, temperature 98.1 F  (36.7 C), temperature source Oral, resp. rate 14, height 5\' 7"  (1.702 m), weight 117 kg, SpO2 (!) 21 %. Physical Exam  GENERAL: The patient is AO x3, in no acute distress. HEENT: Head is normocephalic and atraumatic. EOMI are intact. Mouth is well hydrated and without lesions. NECK: Supple. No masses LUNGS: Clear to auscultation. No presence of rhonchi/wheezing/rales. Adequate chest expansion HEART: RRR, normal s1 and s2. ABDOMEN: Soft, nontender, no guarding,  no peritoneal signs, and nondistended. BS +. No masses. EXTREMITIES: Without any cyanosis, clubbing, rash, lesions or edema. NEUROLOGIC: AOx3, no focal motor deficit. SKIN: no jaundice, no rashes  Assessment/Plan 77 y/o F with PMH DM, HLD, HTN, OSA, coming for screening colonoscopy. The patient is at average risk for colorectal cancer.  We will proceed with colonoscopy today.   Dolores Frame, MD 09/20/2022, 9:15 AM

## 2022-09-21 LAB — SURGICAL PATHOLOGY

## 2022-09-21 NOTE — Progress Notes (Signed)
Cardiology Office Note:    Date:  09/28/2022   ID:  Dominique Bauer, DOB 1945/06/04, MRN [id number]  PCP:  Toma Deiters, MD  Cardiologist:  Dr. Charlton Haws   Electrophysiologist:  n/a  Referring MD: Toma Deiters, MD   No chief complaint on file.   History of Present Illness:    77 y.o. with PAF. History of HTN and OSA. Failed DCC alone and on flecainide 01/07/16. Subsequently begun on Tikosyn in hospital with successful [facility] 04/29/16.  Seen by Dr Graciela Husbands 02/22/17 and beta blocker stopped due to relative bradycardia Last TTE done 08/14/20  No valve disease normal EF LA diameter 5.0 cm    CHADS2-VASc=5       Uses CPAP for OSA.  Sees [facility] d/c 2019  due to frequent infections on it  She has some LE edema.Takes her Aldactone and lasix sporadically the latter less to avoid hypokalemia on Tikosyn   Primary put her on Nexlitol in July 2021 Myalgias with statins LDL 73 A1c 8 Cr .9 K 4.5   No cardiac complaints Discussed needing better control of her BS Had cataract removed and lens implant right eye 08/03/21  Colonoscopy done 09/20/22 with multiple polyps removed Dr Levon Hedger  Not needing to take lasix much for LE edema Tends to be worse in LLE with prior DVT in that leg Did not tolerate Ozempic Made BS higher and gave her abdominal pain No on lantus insulin    Prior CV studies that were reviewed today include:    ETT 12/04/15 Blood pressure demonstrated a normal response to exercise. There was no ST segment deviation noted during stress. Negative, adequate stress test.  TTE 08/14/20 IMPRESSIONS     1. Left ventricular ejection fraction, by estimation, is 60 to 65%. The  left ventricle has normal function. The left ventricle has no regional  wall motion abnormalities. The left ventricular internal cavity size was  mildly dilated. Left ventricular  diastolic parameters are consistent with Grade I diastolic dysfunction  (impaired relaxation).   2. Right ventricular  systolic function is normal. The right ventricular  size is normal. Tricuspid regurgitation signal is inadequate for assessing  PA pressure.   3. Left atrial size was moderately dilated.   4. Right atrial size was mildly dilated.   5. The mitral valve is normal in structure. No evidence of mitral valve  regurgitation. No evidence of mitral stenosis.   6. The aortic valve is tricuspid. Aortic valve regurgitation is not  visualized. No aortic stenosis is present.   Past Medical History:  Diagnosis Date   Arthritis    osteo arthritis   Body mass index 40.0-44.9, adult (HCC)    Diabetes (HCC)    Dysrhythmia    H/O measles    H/O mumps    H/O: whooping cough    History of chicken pox    HLD (hyperlipidemia)    Hypertension    Persistent atrial fibrillation (HCC)    PONV (postoperative nausea and vomiting)    Sciatica    Sleep apnea    not using aything at night right now/LH    Past Surgical History:  Procedure Laterality Date   ABDOMINAL HYSTERECTOMY     BREAST BIOPSY Right 02/01/2012   BREAST EXCISIONAL BIOPSY Bilateral    benign   CARDIOVERSION N/A 11/17/2015   Procedure: CARDIOVERSION;  Surgeon: Wendall Stade, MD;  Location: [facility] ENDOSCOPY;  Service: Cardiovascular;  Laterality: N/A;   CARDIOVERSION N/A 01/07/2016  Procedure: CARDIOVERSION;  Surgeon: Wendall Stade, MD;  Location: [facility] ENDOSCOPY;  Service: Cardiovascular;  Laterality: N/A;   CARDIOVERSION N/A 04/29/2016   Procedure: CARDIOVERSION;  Surgeon: Thurmon Fair, MD;  Location: [facility] ENDOSCOPY;  Service: Cardiovascular;  Laterality: N/A;   CATARACT EXTRACTION W/PHACO Left 07/20/2021   Procedure: CATARACT EXTRACTION PHACO AND INTRAOCULAR LENS PLACEMENT (IOC);  Surgeon: Fabio Pierce, MD;  Location: [facility] ORS;  Service: Ophthalmology;  Laterality: Left;  CDE: 10.07   CATARACT EXTRACTION W/PHACO Right 08/03/2021   Procedure: CATARACT EXTRACTION PHACO AND INTRAOCULAR LENS PLACEMENT (IOC);  Surgeon: Fabio Pierce, MD;   Location: [facility] ORS;  Service: Ophthalmology;  Laterality: Right;  CDE 7.93   COLONOSCOPY WITH PROPOFOL N/A 09/20/2022   Procedure: COLONOSCOPY WITH PROPOFOL;  Surgeon: Dolores Frame, MD;  Location: [facility] ENDO SUITE;  Service: Gastroenterology;  Laterality: N/A;  9:30am;asa 3   DG  BONE DENSITY ([facility] HX)     INNER EAR SURGERY Left    ear drum replacement X2   NASAL SEPTUM SURGERY     POLYPECTOMY  09/20/2022   Procedure: POLYPECTOMY;  Surgeon: Marguerita Merles, Reuel Boom, MD;  Location: [facility] ENDO SUITE;  Service: Gastroenterology;;  cold snare, cold biopsy    Current Medications: Current Meds  Medication Sig   acetaminophen (TYLENOL) 500 MG tablet Take 500-1,000 mg by mouth every 8 (eight) hours as needed for moderate pain.   apixaban (ELIQUIS) 5 MG TABS tablet Take 5 mg by mouth 2 (two) times daily.   Calcium Carbonate-Vit D-Min (CALTRATE PLUS PO) Take 1 capsule by mouth in the morning, at noon, and at bedtime.   cetirizine (ZYRTEC) 10 MG tablet Take 10 mg by mouth daily.   cholecalciferol (VITAMIN D3) 25 MCG (1000 UNIT) tablet Take 1,000 Units by mouth daily.   dofetilide (TIKOSYN) 125 MCG capsule TAKE THREE CAPSULES BY MOUTH TWICE DAILY (LEAVE in stock bottles)   Fluticasone-Salmeterol (ADVAIR) 100-50 MCG/DOSE AEPB Inhale 1 puff into the lungs daily as needed (shortness of breath).   furosemide (LASIX) 40 MG tablet Take 1 tablet (40 mg total) by mouth every other day. as needed x 2 doses for swelling. (Patient taking differently: Take 40 mg by mouth daily as needed for fluid or edema.)   glimepiride (AMARYL) 2 MG tablet Take 4 mg by mouth in the morning and at bedtime.   guaiFENesin (MUCINEX) 600 MG 12 hr tablet Take 600 mg by mouth daily.   HYDROcodone-acetaminophen (NORCO/VICODIN) 5-325 MG tablet Take 1 tablet by mouth every 6 (six) hours as needed for moderate pain.   L-Lysine 500 MG TABS Take 500 mg by mouth in the morning, at noon, and at bedtime.   levalbuterol (XOPENEX) 0.63 MG/3ML  nebulizer solution Take 0.63 mg by nebulization every 6 (six) hours as needed for wheezing or shortness of breath.   losartan (COZAAR) 100 MG tablet Take 100 mg by mouth daily.   magnesium oxide (MAG-OX) 400 MG tablet TAKE 1 TABLET BY MOUTH TWICE A DAY   metoprolol tartrate (LOPRESSOR) 25 MG tablet Take 12.5-25 mg by mouth See admin instructions. Take 25 mg by mouth in the morning and 12.5 mg in the evening   multivitamin-lutein (OCUVITE-LUTEIN) CAPS capsule Take 1 capsule by mouth daily.   Omega-3 Fatty Acids (FISH OIL) 1000 MG CAPS Take 1,000 mg by mouth 3 (three) times daily.    triamcinolone cream (KENALOG) 0.1 % Apply 1 application  topically daily as needed (rash).     Allergies:   Penicillins, Other, and Neosporin [neomycin-bacitracin zn-polymyx]  Social History   Socioeconomic History   Marital status: Married    Spouse name: Not on file   Number of children: Not on file   Years of education: Not on file   Highest education level: Not on file  Occupational History   Not on file  Tobacco Use   Smoking status: Never    Passive exposure: Past   Smokeless tobacco: Never  Vaping Use   Vaping Use: Never used  Substance and Sexual Activity   Alcohol use: No   Drug use: No   Sexual activity: Yes  Other Topics Concern   Not on file  Social History Narrative   Retired Engineer, civil (consulting).  Lives in [place].   Social Determinants of Health   Financial Resource Strain: Low Risk  (08/25/2022)   Overall Financial Resource Strain (CARDIA)    Difficulty of Paying Living Expenses: Not very hard  Food Insecurity: No Food Insecurity (06/18/2022)   Hunger Vital Sign    Worried About Running Out of Food in the Last Year: Never true    Ran Out of Food in the Last Year: Never true  Transportation Needs: No Transportation Needs (08/25/2022)   PRAPARE - Administrator, Civil Service (Medical): No    Lack of Transportation (Non-Medical): No  Physical Activity: Inactive (07/19/2022)   Exercise  Vital Sign    Days of Exercise per Week: 0 days    Minutes of Exercise per Session: 0 min  Stress: Not on file  Social Connections: Not on file     Family History:  The patient's family history includes Breast cancer in her maternal aunt and sister; Colon cancer in her maternal grandmother, paternal aunt, and paternal aunt; Hypertension in her mother.   ROS:   Please see the history of present illness.    Review of Systems  Constitutional: Positive for diaphoresis and malaise/fatigue.  Cardiovascular:  Positive for dyspnea on exertion and irregular heartbeat.  Respiratory:  Positive for snoring.   Skin:  Positive for rash.  Musculoskeletal:  Positive for back pain, joint swelling and myalgias.  Neurological:  Positive for dizziness and headaches.   All other systems reviewed and are negative.   EKGs/Labs/Other Test Reviewed:    EKG:   05/15/20 SB rate 59 first degree QT 440  09/28/2022 SR rate 69 PR 228 msec otherwise normal   Recent Labs: 09/13/2022: BUN 13; Creatinine, Ser 0.75; Hemoglobin 13.4; Magnesium 1.8; Platelets 255; Potassium 4.8; Sodium 134   Recent Lipid Panel No results found for: "CHOL", "TRIG", "HDL", "CHOLHDL", "VLDL", "LDLCALC", "LDLDIRECT"   Physical Exam:    VS:  BP 132/70 (BP Location: Left Arm, Patient Position: Sitting, Cuff Size: Large)   Pulse 95   Ht 5\' 7"  (1.702 m)   Wt 255 lb 9.6 oz (115.9 kg)   SpO2 96%   BMI 40.03 kg/m     Wt Readings from Last 3 Encounters:  09/28/22 255 lb 9.6 oz (115.9 kg)  09/20/22 257 lb 15 oz (117 kg)  09/13/22 258 lb (117 kg)    Affect appropriate Overweight white female  HEENT: normal Neck supple with no adenopathy JVP normal no bruits no thyromegaly Lungs clear with no wheezing and good diaphragmatic motion Heart:  S1/S2 no murmur, no rub, gallop or click PMI normal Abdomen: benighn, BS positve, no tenderness, no AAA no bruit.  No HSM or HJR Distal pulses intact with no bruits Plus one bilateral edema Neuro  non-focal Skin warm and dry No muscular weakness  PLAN:    In order of problems listed above:  1. PAF:  Maintaining NSR on Tikosyn labs and QT ok f/u Dr Graciela Husbands   2. HTN - Well controlled.  Continue current medications and low sodium Dash type diet.     3. OSA - F/U with Dr Tresa Endo regarding issues with CPAP  4. DM:  Ozempic d/c on lantus f/u primary   5. Dyspnea:  Likely from obesity and chronic bronchitis TTE done 08/14/20 EF 60-65% no valve disease Takes aldacone daily and PRN lasix per Dr Graciela Husbands    6. HLD:  On Nexlitol labs with primary   F/U in a year   Charlton Haws

## 2022-09-23 ENCOUNTER — Encounter (HOSPITAL_COMMUNITY): Payer: Self-pay | Admitting: Gastroenterology

## 2022-09-24 ENCOUNTER — Encounter (INDEPENDENT_AMBULATORY_CARE_PROVIDER_SITE_OTHER): Payer: Self-pay | Admitting: *Deleted

## 2022-09-28 ENCOUNTER — Ambulatory Visit: Payer: Medicare Other | Attending: Cardiovascular Disease | Admitting: Cardiovascular Disease

## 2022-09-28 VITALS — BP 132/70 | HR 95 | Ht 67.0 in | Wt 255.6 lb

## 2022-09-28 DIAGNOSIS — I480 Paroxysmal atrial fibrillation: Secondary | ICD-10-CM | POA: Diagnosis not present

## 2022-09-28 DIAGNOSIS — I10 Essential (primary) hypertension: Secondary | ICD-10-CM

## 2022-09-28 NOTE — Patient Instructions (Signed)
Medication Instructions:  Your physician recommends that you continue on your current medications as directed. Please refer to the Current Medication list given to you today.  *If you need a refill on your cardiac medications before your next appointment, please call your pharmacy*  Lab Work: If you have labs (blood work) drawn today and your tests are completely normal, you will receive your results only by: MyChart Message (if you have MyChart) OR A paper copy in the mail If you have any lab test that is abnormal or we need to change your treatment, we will call you to review the results.  Testing/Procedures: None ordered today.  Follow-Up: At [facility], you and your health needs are our priority.  As part of our continuing mission to provide you with exceptional heart care, we have created designated Provider Care Teams.  These Care Teams include your primary Cardiologist (physician) and Advanced Practice Providers (APPs -  Physician Assistants and Nurse Practitioners) who all work together to provide you with the care you need, when you need it.  We recommend signing up for the patient portal called "MyChart".  Sign up information is provided on this After Visit Summary.  MyChart is used to connect with patients for Virtual Visits (Telemedicine).  Patients are able to view lab/test results, encounter notes, upcoming appointments, etc.  Non-urgent messages can be sent to your provider as well.   To learn more about what you can do with MyChart, go to [email or web address].    Your next appointment:   1 year(s)  Provider:   Peter Nishan, MD      

## 2022-10-06 ENCOUNTER — Ambulatory Visit: Payer: Self-pay | Admitting: *Deleted

## 2022-10-06 ENCOUNTER — Encounter: Payer: Self-pay | Admitting: *Deleted

## 2022-10-06 NOTE — Patient Outreach (Signed)
Care Coordination   Follow Up Visit Note   10/06/2022 Name: Dominique Bauer MRN: [id number] DOB: [birth year]  [name] is a 77 y.o. year old female who sees Hasanaj, Myra Gianotti, MD for primary care. I spoke with  [name] by phone today.  What matters to the patients health and wellness today?  Managing blood sugar    Goals Addressed               This Visit's Progress     Patient Stated     Diabetes Management (pt-stated)   On track     Care Coordination Goals: Patient will follow-up with PCP every 3 months or as recommended Patient will take medication as prescribed and reach out to provider with any negative side effects Started on Lantus 20u in the evening along with Amaryl 4mg  Patient will continue to monitor and record blood sugar 2 times per day and as needed with glucometer, and will call PCP with any readings outside of recommended range Patient will take blood sugar log and meter to provider visits for review Patient will follow a modified carbohydrate diet and decrease simple carbohydrates and sugars Patient will increase activity level as tolerated with an ultimate goal of at least 150 minutes of exercise per week Patient will reach out to RN Care Coordinator [phone] with any care coordination or resource needs        Other     Increase Physical Activity Level   Not on track     Care Coordination Goals Patient will consider using her Silver Sneakers benefit through her insurance company at the [employer] exercises since she has osteoarthritis and chronic joint pain Patient has not looked into this but is considering Patient will increase activity level slowly with an ultimate goal of at least 150 minutes of moderate activity per week Patient will reach out to RN Care Coordinator [phone] with any resource or care coordination needs        SDOH assessments and interventions completed:  Yes  SDOH  Interventions Today    Flowsheet Row Most Recent Value  SDOH Interventions   Transportation Interventions Intervention Not Indicated  Financial Strain Interventions Intervention Not Indicated        Care Coordination Interventions:  Yes, provided  Interventions Today    Flowsheet Row Most Recent Value  Chronic Disease   Chronic disease during today's visit Diabetes  General Interventions   General Interventions Discussed/Reviewed General Interventions Discussed, General Interventions Reviewed, Labs, Doctor Visits, Durable Medical Equipment (DME)  [Blood sugar has been below 200 since stating Lantus. No readings < 70.]  Labs Hgb A1c every 3 months  Doctor Visits Discussed/Reviewed Doctor Visits Discussed, Doctor Visits Reviewed, PCP, Specialist  [Dr Tresa Endo (cardio) scheduled for 8/19. Scheduled with PCP in Aug as well.]  Durable Medical Equipment (DME) Glucomoter  PCP/Specialist Visits Compliance with follow-up visit  Exercise Interventions   Exercise Discussed/Reviewed Exercise Discussed, Exercise Reviewed, Physical Activity  Physical Activity Discussed/Reviewed Physical Activity Discussed, Physical Activity Reviewed  Education Interventions   Education Provided Provided Education  Provided Verbal Education On Nutrition, Mental Health/Coping with Illness, When to see the doctor, Blood Sugar Monitoring, Labs  Labs Reviewed Hgb A1c  [09/08/22 A1C 9.0]  Nutrition Interventions   Nutrition Discussed/Reviewed Nutrition Discussed, Nutrition Reviewed, Carbohydrate meal planning, Portion sizes, Decreasing sugar intake, Fluid intake  [Eat meals at regular intervals to avoid drop in blood sugar due to long acting insulin]  Pharmacy Interventions  Pharmacy Dicussed/Reviewed Pharmacy Topics Discussed, Pharmacy Topics Reviewed, Medications and their functions  Safety Interventions   Safety Discussed/Reviewed Safety Discussed, Safety Reviewed, Fall Risk, Home Safety       Follow up plan:  Follow up call scheduled for 11/18/22    Encounter Outcome:  Pt. Visit Completed   Demetrios Loll, BSN, RN-BC RN Care Coordinator [facility]  [facility] Direct Dial: [phone] Main #: [phone]

## 2022-10-12 DIAGNOSIS — G4733 Obstructive sleep apnea (adult) (pediatric): Secondary | ICD-10-CM | POA: Diagnosis not present

## 2022-10-13 DIAGNOSIS — M5137 Other intervertebral disc degeneration, lumbosacral region: Secondary | ICD-10-CM | POA: Diagnosis not present

## 2022-10-13 DIAGNOSIS — M9903 Segmental and somatic dysfunction of lumbar region: Secondary | ICD-10-CM | POA: Diagnosis not present

## 2022-10-13 DIAGNOSIS — M9902 Segmental and somatic dysfunction of thoracic region: Secondary | ICD-10-CM | POA: Diagnosis not present

## 2022-10-13 DIAGNOSIS — M9904 Segmental and somatic dysfunction of sacral region: Secondary | ICD-10-CM | POA: Diagnosis not present

## 2022-10-26 DIAGNOSIS — L84 Corns and callosities: Secondary | ICD-10-CM | POA: Diagnosis not present

## 2022-10-26 DIAGNOSIS — E1142 Type 2 diabetes mellitus with diabetic polyneuropathy: Secondary | ICD-10-CM | POA: Diagnosis not present

## 2022-10-26 DIAGNOSIS — B351 Tinea unguium: Secondary | ICD-10-CM | POA: Diagnosis not present

## 2022-10-26 DIAGNOSIS — M79676 Pain in unspecified toe(s): Secondary | ICD-10-CM | POA: Diagnosis not present

## 2022-11-10 DIAGNOSIS — M5137 Other intervertebral disc degeneration, lumbosacral region: Secondary | ICD-10-CM | POA: Diagnosis not present

## 2022-11-10 DIAGNOSIS — M9903 Segmental and somatic dysfunction of lumbar region: Secondary | ICD-10-CM | POA: Diagnosis not present

## 2022-11-10 DIAGNOSIS — M9902 Segmental and somatic dysfunction of thoracic region: Secondary | ICD-10-CM | POA: Diagnosis not present

## 2022-11-10 DIAGNOSIS — M9904 Segmental and somatic dysfunction of sacral region: Secondary | ICD-10-CM | POA: Diagnosis not present

## 2022-11-11 ENCOUNTER — Other Ambulatory Visit: Payer: Self-pay | Admitting: Internal Medicine

## 2022-11-11 DIAGNOSIS — Z0000 Encounter for general adult medical examination without abnormal findings: Secondary | ICD-10-CM

## 2022-11-15 ENCOUNTER — Ambulatory Visit: Payer: Medicare Other | Attending: Cardiovascular Disease | Admitting: Cardiovascular Disease

## 2022-11-15 DIAGNOSIS — I10 Essential (primary) hypertension: Secondary | ICD-10-CM

## 2022-11-15 DIAGNOSIS — I444 Left anterior fascicular block: Secondary | ICD-10-CM

## 2022-11-15 DIAGNOSIS — G4733 Obstructive sleep apnea (adult) (pediatric): Secondary | ICD-10-CM

## 2022-11-15 DIAGNOSIS — D6869 Other thrombophilia: Secondary | ICD-10-CM | POA: Diagnosis not present

## 2022-11-15 DIAGNOSIS — I480 Paroxysmal atrial fibrillation: Secondary | ICD-10-CM

## 2022-11-15 DIAGNOSIS — R600 Localized edema: Secondary | ICD-10-CM

## 2022-11-15 NOTE — Progress Notes (Unsigned)
Cardiology Office Note    Date:  11/16/2022   ID:  Dominique Bauer, Dominique Bauer February 02, 1946, MRN [id number]  PCP:  Toma Deiters, MD  Cardiologist:  Nicki Guadalajara, MD (sleep), Dr. Eden Emms, Dr.. Klein (EP)  17 month F/U sleep evaluation  History of Present Illness:  Dominique Bauer is a 77 y.o. female who presents for a 17 month follow-up sleep evaluation.  Dominique Bauer is a patient of Dr. Eden Emms and and Dr. Graciela Husbands who has a history of hypertension, and atrial fibrillation.  She had a long history of persistent atrial fibrillation and had failed previous antiarrhythmic therapy and cardioversions, but recently underwent successful cardioversion with dofetilide.  Her left atrial size is mildly enlarged at 43 mm.  Due to concerns for sleep apnea she was referred for diagnostic sleep study which was done on 02/15/2016.  This revealed severe obstructive sleep apnea with an AHI of 31.2 per hour.  Sleep apnea was more severe during REM sleep with an AHI of 58.3 per hour and with supine position at 46.1 per hour.  She had significant oxygen desaturation to a nadir of 69% on her diagnostic study.  She socially underwent a CPAP titration trial on 05/05/2016.  It was initially recommended that she use CPAP auto with a minimum pressure of 10 and maximum pressure of 18 cm water pressure with heated humidification.  She was unable to tolerate the high pressures and ultimately this was reduced to a maximum pressure of 16.  She has been using a fullface mask.  A download was obtained from 06/21/2016 through 07/20/2016.  This demonstrated excellent compliance with 100% usage stays at 97% of days with usage greater than 4 hours.  However, she was only sleeping 4 hours and 30 minutes.  There was no significant leak.  Her 95th percentile pressure was 13.4 with a maximum average pressure of 14.2. Upon further questioning, she states that she typically goes to bed between 11 PM and midnight and often is in bed until 10 AM  in the morning.  When I saw her, she was not using CPAP through the duration of her time in bed.  She felt improved since initiating CPAP.   I saw her in July 2018 and prior to that visit she was having difficulty with her mask with tenderness on the bridge of her nose.  At that time, I also had significant discussion with her and recommended the new Respironics dream were full face mask.  Apparently, she took a [facility] to [facility].  At the time they did not have this new mask which had just become available.  Over the past several months, she has significantly increased her sleep duration with CPAP use.  I obtained a new download in the office  from July 1-30, 2018 which demonstrated 100% compliancec with usage stays and uses greater than 4 hours and averaging 6 hours and 57 minutes of CPAP use per night.  She has ResMed AirSense 10 Auto system and has a minimum pressure setting at 10 and maximum of 14.  Her 95th percent.  Average pressure was 13.2.  At times she still having increased leak around her nasal bridge.  Her AHI was 1.1.  She was been unaware of any recurrent atrial fibrillation.    I saw her in October 2019 at which time continued to use CPAP with 100% compliance.  I obtained a new download from September 22 through January 16, 2018 which confirmed 100% compliance.  Her  ResMed air sense auto set is set at a minimum pressure of 10 with maximum of 14.  95th percentile pressure is 11.6 with a maximum average pressure of 12.1.  AHI remains excellent at 0.5.  She feels well.  She denies any residual daytime sleepiness.  A new Epworth scale was calculated in the office  and this endorsed at 7.  She was unaware of breakthrough snoring.  She denied any  bruxism, restless legs, hypnagogic hallucinations or cataplexy.    When I saw her in November 2020 she continued to do well. She last saw Dr. Cyndie Chime in September 2020 and was maintaining sinus rhythm on Tikosyn and was without QTC  prolongation.  Her hypertension was controlled and she was on a low-sodium Dash type diet.  Presently, she continues to use CPAP.  A new download was obtained from October 13 through February 07, 2019 which confirms excellent compliance.  However she does have a mask leak.  She has been using a full facemask.  Her CPAP has been set at a minimum pressure of 10 with a maximum pressure of 14.  95 percentile pressure is 11.0 with a maximum average pressure of 11.8.  AHI is excellent at 0.4.  [employer] is her DME company who brought out advance home [facility].  Of note, for for family members including a daughter, son-in-law, and 2 grandchildren have come down with COVID-19 infection.  She has not had any recent exposure to them.  During her evaluation, due to her significant mask leak I recommended changing her fullface mask to a ResMed air fit F 30i mask.  I saw her on May 15, 2020.  She felt well and she admits to excellent compliance with her CPAP therapy and has not had any recurrent episodes of arrhythmia.  I obtained a download from April 14, 2020 through through May 13, 2020.  Compliance is 100% with average use at 8 hours and 23 minutes.  Her CPAP is set at a range of 10 to 14 cm of water and AHI is excellent at 0.8.  95th percentile pressure is 12.0 with maximum average pressure 12.8 cm of water.  Presently she goes to bed around 1 AM and wakes up around 11 AM.  She is sleeping well.  She denies breakthrough snoring.  A new Epworth Sleepiness Scale score was calculated in the office today and this endorsed at 5 arguing against residual daytime sleepiness.  States her blood pressure has been stable and she continues to tolerate Tikosyn without breakthrough atrial arrhythmia.  I last saw her on June 04, 2021.  Since my last evaluation, she has been seen by Dr. Graciela Husbands as well as Dr. Eden Emms and last saw Francis Dowse, PA-C in February 2023.  She is unaware of any recurrent atrial fibrillation.  She admits to  bilateral lower extremity edema.  She is no longer taking spironolactone and takes furosemide 40 mg 1 time per week.  She continues to be on losartan 50 mg, metoprolol tartrate 25 mg in the morning and 12.5 mg in the evening Tikosyn and takes three 125 mg capsules twice a day.  She is anticoagulated on Eliquis.  She is no longer taking Nexletol.  She is diabetic.  She continues to use CPAP with 100% compliance.  A new download was obtained in the office today which showed 10 percent compliance with average use at 8 hours and 20 minutes.  Typically she goes to bed at 1 AM and wakes up around 10.  Her pressure is set at a range of 10 to 14 cm of water and her 95th percentile pressure is 11.1 with maximum average pressure 11.8.  AHI is excellent at 0.6.  She does have mild mask leak.  She now has her old fullface mask and did not seem to like the F30i mask any better.    Since I last saw her, she has continued to be compliant with CPAP therapy.  [employer] is her DME company.  Her set up date was May 20, 2016.  She has been contacted by [employer] about getting a new CPAP device.  I obtained a download from July 21 through November 15, 2022.  Compliance is excellent at 100% with average use of 7 hours and 43 minutes.  CPAP is set at a pressure range of 10 to 14 cm of water.  AHI is excellent at 0.6.  95th percentile pressure is 11.8 with maximum average pressure 12.3.  She is using a fullface mask.  She continues to be followed by Dr. Winn Jock and last saw him on September 28, 2022 and was stable.  She presents for follow-up evaluation.   Past Medical History:  Diagnosis Date   Arthritis    osteo arthritis   Body mass index 40.0-44.9, adult (HCC)    Diabetes (HCC)    Dysrhythmia    H/O measles    H/O mumps    H/O: whooping cough    History of chicken pox    HLD (hyperlipidemia)    Hypertension    Persistent atrial fibrillation (HCC)    PONV (postoperative nausea and vomiting)    Sciatica    Sleep apnea    not  using aything at night right now/LH    Past Surgical History:  Procedure Laterality Date   ABDOMINAL HYSTERECTOMY     BREAST BIOPSY Right 02/01/2012   BREAST EXCISIONAL BIOPSY Bilateral    benign   CARDIOVERSION N/A 11/17/2015   Procedure: CARDIOVERSION;  Surgeon: Wendall Stade, MD;  Location[facility] ENDOSCOPY;  Service: Cardiovascular;  Laterality: N/A;   CARDIOVERSION N/A 01/07/2016   Procedure: CARDIOVERSION;  Surgeon: Wendall Stade, MD;  Location: [facility] ENDOSCOPY;  Service: Cardiovascular;  Laterality: N/A;   CARDIOVERSION N/A 04/29/2016   Procedure: CARDIOVERSION;  Surgeon: Thurmon Fair, MD;  Location: [facility] ENDOSCOPY;  Service: Cardiovascular;  Laterality: N/A;   CATARACT EXTRACTION W/PHACO Left 07/20/2021   Procedure: CATARACT EXTRACTION PHACO AND INTRAOCULAR LENS PLACEMENT (IOC);  Surgeon: Fabio Pierce, MD;  Location: [facility] ORS;  Service: Ophthalmology;  Laterality: Left;  CDE: 10.07   CATARACT EXTRACTION W/PHACO Right 08/03/2021   Procedure: CATARACT EXTRACTION PHACO AND INTRAOCULAR LENS PLACEMENT (IOC);  Surgeon: Fabio Pierce, MD;  Location: [facility] ORS;  Service: Ophthalmology;  Laterality: Right;  CDE 7.93   COLONOSCOPY WITH PROPOFOL N/A 09/20/2022   Procedure: COLONOSCOPY WITH PROPOFOL;  Surgeon: Dolores Frame, MD;  Location: [facility] ENDO SUITE;  Service: Gastroenterology;  Laterality: N/A;  9:30am;asa 3   DG  BONE DENSITY ([facility] HX)     INNER EAR SURGERY Left    ear drum replacement X2   NASAL SEPTUM SURGERY     POLYPECTOMY  09/20/2022   Procedure: POLYPECTOMY;  Surgeon: Marguerita Merles, Reuel Boom, MD;  Location: [facility] ENDO SUITE;  Service: Gastroenterology;;  cold snare, cold biopsy    Current Medications: Outpatient Medications Prior to Visit  Medication Sig Dispense Refill   acetaminophen (TYLENOL) 500 MG tablet Take 500-1,000 mg by mouth every 8 (eight) hours as needed for moderate pain.  apixaban (ELIQUIS) 5 MG TABS tablet Take 5 mg by mouth 2 (two) times daily.     Calcium  Carbonate-Vit D-Min (CALTRATE PLUS PO) Take 1 capsule by mouth in the morning, at noon, and at bedtime.     cetirizine (ZYRTEC) 10 MG tablet Take 10 mg by mouth daily.     cholecalciferol (VITAMIN D3) 25 MCG (1000 UNIT) tablet Take 1,000 Units by mouth daily.     dofetilide (TIKOSYN) 125 MCG capsule TAKE THREE CAPSULES BY MOUTH TWICE DAILY (LEAVE in stock bottles) 540 capsule 3   Fluticasone-Salmeterol (ADVAIR) 100-50 MCG/DOSE AEPB Inhale 1 puff into the lungs daily as needed (shortness of breath).     furosemide (LASIX) 40 MG tablet Take 1 tablet (40 mg total) by mouth every other day. as needed x 2 doses for swelling. (Patient taking differently: Take 40 mg by mouth daily as needed for fluid or edema.) 30 tablet 8   glimepiride (AMARYL) 2 MG tablet Take 4 mg by mouth in the morning and at bedtime.     guaiFENesin (MUCINEX) 600 MG 12 hr tablet Take 600 mg by mouth daily.     HYDROcodone-acetaminophen (NORCO/VICODIN) 5-325 MG tablet Take 1 tablet by mouth every 6 (six) hours as needed for moderate pain.     insulin glargine (LANTUS) 100 UNIT/ML injection Inject 20 Units into the skin daily.     L-Lysine 500 MG TABS Take 500 mg by mouth in the morning, at noon, and at bedtime.     levalbuterol (XOPENEX) 0.63 MG/3ML nebulizer solution Take 0.63 mg by nebulization every 6 (six) hours as needed for wheezing or shortness of breath.     losartan (COZAAR) 100 MG tablet Take 100 mg by mouth daily.     magnesium oxide (MAG-OX) 400 MG tablet TAKE 1 TABLET BY MOUTH TWICE A DAY 180 tablet 3   metoprolol tartrate (LOPRESSOR) 25 MG tablet Take 12.5-25 mg by mouth See admin instructions. Take 25 mg by mouth in the morning and 12.5 mg in the evening     multivitamin-lutein (OCUVITE-LUTEIN) CAPS capsule Take 1 capsule by mouth daily.     Omega-3 Fatty Acids (FISH OIL) 1000 MG CAPS Take 1,000 mg by mouth 3 (three) times daily.      triamcinolone cream (KENALOG) 0.1 % Apply 1 application  topically daily as needed  (rash).     No facility-administered medications prior to visit.     Allergies:   Penicillins, Other, and Neosporin [neomycin-bacitracin zn-polymyx]   Social History   Socioeconomic History   Marital status: Married    Spouse name: Not on file   Number of children: Not on file   Years of education: Not on file   Highest education level: Not on file  Occupational History   Not on file  Tobacco Use   Smoking status: Never    Passive exposure: Past   Smokeless tobacco: Never  Vaping Use   Vaping status: Never Used  Substance and Sexual Activity   Alcohol use: No   Drug use: No   Sexual activity: Yes  Other Topics Concern   Not on file  Social History Narrative   Retired Engineer, civil (consulting).  Lives in [place].   Social Determinants of Health   Financial Resource Strain: Low Risk  (10/06/2022)   Overall Financial Resource Strain (CARDIA)    Difficulty of Paying Living Expenses: Not very hard  Food Insecurity: No Food Insecurity (06/18/2022)   Hunger Vital Sign    Worried About Running Out  of Food in the Last Year: Never true    Ran Out of Food in the Last Year: Never true  Transportation Needs: No Transportation Needs (10/06/2022)   PRAPARE - Administrator, Civil Service (Medical): No    Lack of Transportation (Non-Medical): No  Physical Activity: Inactive (07/19/2022)   Exercise Vital Sign    Days of Exercise per Week: 0 days    Minutes of Exercise per Session: 0 min  Stress: Not on file  Social Connections: Not on file     Family History:  The patient's family history includes Breast cancer in her maternal aunt and sister; Colon cancer in her maternal grandmother, paternal aunt, and paternal aunt; Hypertension in her mother.   ROS General: Negative; No fevers, chills, or night sweats; morbid obesity HEENT: Negative; No changes in vision or hearing, sinus congestion, difficulty swallowing Pulmonary: Negative; No cough, wheezing, shortness of breath,  hemoptysis Cardiovascular: No recurrent AF; positive for bilateral lower extremity edema GI: Negative; No nausea, vomiting, diarrhea, or abdominal pain GU: Negative; No dysuria, hematuria, or difficulty voiding Musculoskeletal: Negative; no myalgias, joint pain, or weakness Hematologic/Oncology: Negative; no easy bruising, bleeding Endocrine: Positive for diabetes Neuro: Negative; no changes in balance, headaches Skin: Negative; No rashes or skin lesions Psychiatric: Negative; No behavioral problems, depression Sleep:See HPI Other comprehensive 14 point system review is negative.   PHYSICAL EXAM:   VS:  BP 138/64 (BP Location: Left Arm, Patient Position: Sitting, Cuff Size: Normal)   Pulse (!) 57   Ht 5\' 7"  (1.702 m)   Wt 261 lb (118.4 kg)   SpO2 95%   BMI 40.88 kg/m   Repeat blood pressure by me was 132/64  Wt Readings from Last 3 Encounters:  11/15/22 261 lb (118.4 kg)  09/28/22 255 lb 9.6 oz (115.9 kg)  09/20/22 257 lb 15 oz (117 kg)   General: Alert, oriented, no distress.  Morbid obesity Skin: normal turgor, no rashes, warm and dry HEENT: Normocephalic, atraumatic. Pupils equal round and reactive to light; sclera anicteric; extraocular muscles intact; Nose without nasal septal hypertrophy Mouth/Parynx benign; Mallinpatti scale 4 Neck: No JVD, no carotid bruits; normal carotid upstroke Lungs: clear to ausculatation and percussion; no wheezing or rales Chest wall: without tenderness to palpitation Heart: PMI not displaced, RRR, s1 s2 normal, 1/6 systolic murmur, no diastolic murmur, no rubs, gallops, thrills, or heaves Abdomen: soft, nontender; no hepatosplenomehaly, BS+; abdominal aorta nontender and not dilated by palpation. Back: no CVA tenderness Pulses 2+ Musculoskeletal: full range of motion, normal strength, no joint deformities Extremities: Trace lower extremity edema; no clubbing cyanosis, Homan's sign negative  Neurologic: grossly nonfocal; Cranial nerves  grossly wnl Psychologic: Normal mood and affect    Studies/Labs Reviewed:   EKG Interpretation Date/Time:  Monday November 15 2022 10:44:52 EDT Ventricular Rate:  56 PR Interval:  230 QRS Duration:  90 QT Interval:  426 QTC Calculation: 411 R Axis:   -50  Text Interpretation: Sinus bradycardia with 1st degree A-V block Left anterior fascicular block Septal infarct , age undetermined Possible Lateral infarct , age undetermined When compared with ECG of 24-Sep-2016 11:15, PR interval has increased Septal infarct is now Present Borderline criteria for Lateral infarct are now Present Nonspecific T wave abnormality now evident in Lateral leads Confirmed by Nicki Guadalajara ([id number]) on 11/15/2022 11:10:43 AM     June 04, 2021 ECG (independently read by me): SInus rhythm at 66; 1st degree AV block, PR 214 msec; QTc 467 msec.  May 15, 2020 ECG (independently read by me): Sinus bradycardia at 59; 1st degree AV block, PR interval 210 ms; QTC interval 435 ms.  November 2020 ECG (independently read by me): NSR at 75; First degree AV block PR 216 msec; QS V1-2.      October 2019 ECG (independently read by me): Sinus bradycardia at 56 bpm.  First-degree AV block with a PR interval of 210 ms.  Q waves inferiorly with preserved R waves  July 2018 ECG (independently read by me): Sinus bradycardia 53 bpm.  PR interval 192 ms.  QTc interval 457 ms.  Nonspecific T changes in lead 3.  April 2018 ECG (independently read by me): Sinus bradycardia 56 bpm.  Left axis deviation.  T-wave inversion in lead 3.  QS in V2.  QTc interval 420 ms.  PR interval 196 ms.  Recent Labs:    Latest Ref Rng & Units 09/13/2022    4:33 PM 03/09/2022    4:43 PM 08/12/2021    2:23 PM  BMP  Glucose 70 - 99 mg/dL 161  096  045   BUN 8 - 27 mg/dL 13  13  16    Creatinine 0.57 - 1.00 mg/dL 4.09  8.11  9.14   BUN/Creat Ratio 12 - 28 17  18  23    Sodium 134 - 144 mmol/L 134  139  136   Potassium 3.5 - 5.2 mmol/L 4.8  5.0  4.3    Chloride 96 - 106 mmol/L 99  100  97   CO2 20 - 29 mmol/L 22  25  20    Calcium 8.7 - 10.3 mg/dL 78.2  95.6  21.3          No data to display             Latest Ref Rng & Units 09/13/2022    4:33 PM 03/09/2022    4:43 PM 05/06/2021    3:23 PM  CBC  WBC 3.4 - 10.8 x10E3/uL 10.7  12.5  11.2   Hemoglobin 11.1 - 15.9 g/dL 08.6  57.8  46.9   Hematocrit 34.0 - 46.6 % 38.9  40.0  38.5   Platelets 150 - 450 x10E3/uL 255  285  291    Lab Results  Component Value Date   MCV 88 09/13/2022   MCV 89 03/09/2022   MCV 88 05/06/2021   No results found for: "TSH" No results found for: "HGBA1C"   BNP No results found for: "BNP"  ProBNP    Component Value Date/Time   PROBNP 270 08/12/2021 1423     Lipid Panel  No results found for: "CHOL", "TRIG", "HDL", "CHOLHDL", "VLDL", "LDLCALC", "LDLDIRECT"   RADIOLOGY: No results found.   Additional studies/ records that were reviewed today include:  I had previously  read the office notes of Drs. Graciela Husbands admission, and reviewed her diagnostic polysomnogram as well as CPAP titration trial.  I obtained a download of her CPAP unit in the office today for compliance and efficacy assessment.  In the office today I obtained a new download from July 1 through 10/25/2016.   ASSESSMENT:    1. OSA on CPAP   2. Paroxysmal atrial fibrillation (HCC)   3. Primary hypertension   4. Bilateral lower extremity edema   5. Secondary hypercoagulable state (HCC)   6. Morbid obesity (HCC)   7. LAFB (left anterior fascicular block)     PLAN:  Dominique Bauer is a 77 year-old female who has a history of hypertension, and  previous persistent atrial fibrillation which proved refractory to additional antiarrhythmics and prior cardioversions, but ultimately she was successfully cardioverted following a tikosyn load.  She was  found to have severe obstructive sleep apnea with an overall AHI of 31.2 per hour and very severe sleep apnea during REM sleep with  an AHI of 58.3 per hour.  She had marked oxygen desaturation to a nadir of 69% on her diagnostic study.  When I saw her for her initial sleep evaluation she was meeting compliance but was sleeping an inadequate duration at only 4 hours and 30 minutes.  In prior evaluations I have had a lengthy discussion with her regarding the effects of obstructive sleep apnea on cardiovascular health and particularly with reference to its risk for atrial fibrillation and increased incidence of recurrence of atrial fibrillation if untreated.  Since I last saw her, she has been stable from a CPAP perspective.  Her most recent download from July 21 through November 15, 2022 shows 100% compliance with average use of 7 hours and 43 minutes.  AHI is 0.6 and her CPAP pressure range is set at 10 to 14 cm with her 95th percentile pressure 11.8 and maximum average pressure 12.3 cm of water.  She was contacted by her DME company, [employer] about getting a new CPAP device.  Her CPAP set up date was May 20, 2017.  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  She qualifies for new machine since her machine is over 27 years old.  However since she is doing well with current therapy I have recommended she can continue with current treatment.  However next  next February will be 7 years and it may be worthwhile to consider getting a new ResMed AirSense 11 AutoSet unit at that time prior to any potential future issues with her machine.  She continues to use a fullface mask.  I will need to see her within 90 days of getting a new set CPAP unit.  Her blood pressure today is stable at 132/64 on losartan 100 mg daily.  She takes furosemide on an as-needed basis and does have trace edema.  She also is on metoprolol tartrate 12.5 mg daily in addition to dofetilide 3 capsules of 125 mcg twice a day and has been without recurrent atrial fibrillation.  She is anticoagulated on Eliquis.  She is diabetic on glimepiride in  addition to insulin.  I will see her in 2025 within 90 days of her getting a new CPAP device or sooner as needed.   Medication Adjustments/Labs and Tests Ordered: Current medicines are reviewed at length with the patient today.  Concerns regarding medicines are outlined above.  Medication changes, Labs and Tests ordered today are listed in the Patient Instructions below.  Patient Instructions  Medication Instructions:  *If you need a refill on your cardiac medications before your next appointment, please call your pharmacy*   Lab Work: If you have labs (blood work) drawn today and your tests are completely normal, you will receive your results only by: MyChart Message (if you have MyChart) OR A paper copy in the mail If you have any lab test that is abnormal or we need to change your treatment, we will call you to review the results.   Follow-Up: At [facility], you and your health needs are our priority.  As part of our continuing mission to provide you with exceptional heart care, we have created designated Provider Care Teams.  These Care Teams include your primary Cardiologist (physician) and Advanced Practice Providers (APPs -  Physician Assistants and Nurse Practitioners) who all work together to provide you with the care you need, when you need it.  We recommend signing up for the patient portal called "MyChart".  Sign up information is provided on this After Visit Summary.  MyChart is used to connect with patients for Virtual Visits (Telemedicine).  Patients are able to view lab/test results, encounter notes, upcoming appointments, etc.  Non-urgent messages can be sent to your provider as well.   To learn more about what you can do with MyChart, go to [email or web address].    Your next appointment:   90 days after receiving new cpap machine  Provider:   Nicki Guadalajara, MD    You are eligible for new cpap machine in January 2025. Once received, you will need to do a 90  day sleep compliance with your provider. Call the office for your follow up sleep compliance appointment.   Signed, Nicki Guadalajara, MD, [facility] ABSM Diplomate, American Board of Sleep Medicine  11/16/2022 11:22 AM    [facility] [address], [place], [place]  [address] Phone: [phone]

## 2022-11-15 NOTE — Patient Instructions (Addendum)
Medication Instructions:  *If you need a refill on your cardiac medications before your next appointment, please call your pharmacy*   Lab Work: If you have labs (blood work) drawn today and your tests are completely normal, you will receive your results only by: MyChart Message (if you have MyChart) OR A paper copy in the mail If you have any lab test that is abnormal or we need to change your treatment, we will call you to review the results.   Follow-Up: At [facility], you and your health needs are our priority.  As part of our continuing mission to provide you with exceptional heart care, we have created designated Provider Care Teams.  These Care Teams include your primary Cardiologist (physician) and Advanced Practice Providers (APPs -  Physician Assistants and Nurse Practitioners) who all work together to provide you with the care you need, when you need it.  We recommend signing up for the patient portal called "MyChart".  Sign up information is provided on this After Visit Summary.  MyChart is used to connect with patients for Virtual Visits (Telemedicine).  Patients are able to view lab/test results, encounter notes, upcoming appointments, etc.  Non-urgent messages can be sent to your provider as well.   To learn more about what you can do with MyChart, go to [email or web address].    Your next appointment:   90 days after receiving new cpap machine  Provider:   Nicki Guadalajara, MD    You are eligible for new cpap machine in January 2025. Once received, you will need to do a 90 day sleep compliance with your provider. Call the office for your follow up sleep compliance appointment.

## 2022-11-16 ENCOUNTER — Encounter: Payer: Self-pay | Admitting: Cardiovascular Disease

## 2022-11-18 ENCOUNTER — Encounter: Payer: Self-pay | Admitting: *Deleted

## 2022-11-18 ENCOUNTER — Ambulatory Visit: Payer: Self-pay | Admitting: *Deleted

## 2022-11-18 NOTE — Patient Outreach (Signed)
Care Coordination   Follow Up Visit Note   11/18/2022 Name: Dominique Bauer MRN: [id number] DOB: Apr 22, 1945  Dominique Bauer is a 77 y.o. year old female who sees Hasanaj, Myra Gianotti, MD for primary care. I spoke with  [name] by phone today.  What matters to the patients health and wellness today?  Continuing to improve blood sugar management    Goals Addressed               This Visit's Progress     Patient Stated     Diabetes Management (pt-stated)   On track     Care Coordination Goals: Patient will keep appointment with Dr Olena Leatherwood in September Patient will eat 3 meals per day with 30 GM of CHO and up to 2 snacks per day with less than 15 GM of CHO. Admits there is room for improvement Patient will increase activity level as tolerated with an ultimate goal of at least 150 minutes of exercise per week Patient will reach out to RN Care Coordinator [phone] with any care coordination or resource needs        Other     Increase Physical Activity Level   Not on track     Care Coordination Goals Patient will consider using her Silver Sneakers benefit through her insurance company at the [employer] exercises since she has osteoarthritis and chronic joint pain Patient has not looked into this but is considering Patient will increase activity level slowly with an ultimate goal of at least 150 minutes of moderate activity per week Patient will reach out to RN Care Coordinator [phone] with any resource or care coordination needs        SDOH assessments and interventions completed:  Yes  SDOH Interventions Today    Flowsheet Row Most Recent Value  SDOH Interventions   Transportation Interventions Intervention Not Indicated  Health Literacy Interventions Intervention Not Indicated        Care Coordination Interventions:  Yes, provided  Interventions Today    Flowsheet Row Most Recent Value  Chronic Disease   Chronic  disease during today's visit Diabetes  General Interventions   General Interventions Discussed/Reviewed General Interventions Discussed, General Interventions Reviewed, Labs, Doctor Visits, Durable Medical Equipment (DME)  Labs Hgb A1c every 3 months, Kidney Function  Doctor Visits Discussed/Reviewed Doctor Visits Discussed, Doctor Visits Reviewed, PCP, Annual Wellness Visits  [reviewed and discussed recent cardiology visit regarding sleep apnea managment. [employer] provides supplies. Compliant with CPAP usage.]  Durable Medical Equipment (DME) Glucomoter, Other  [Monitoring blood sugar at least twice per day. Readings are improving since medications were changed. No readings less than 70 or greater than 200. Fasting blood sugar was 152. Typically averages in 130s fasting. Recent lowest was 105. Wears CPAP nightly]  PCP/Specialist Visits Compliance with follow-up visit  [3 month F/U scheduled with Dr Olena Leatherwood for the beginning of September. Take blood sugar log for review.]  Exercise Interventions   Exercise Discussed/Reviewed Exercise Discussed, Exercise Reviewed, Physical Activity  [Performs ADLs but does not exercise. Encouraged to increase with an ultimate goal of 150 minutes per week.]  Physical Activity Discussed/Reviewed Physical Activity Discussed, Physical Activity Reviewed, Types of exercise  [again encouraged to increase activity as tolerated. Limited due to joint pain. Encouraged water aerobics at [employer].]  Education Interventions   Education Provided Provided Education  Provided Verbal Education On Nutrition, Blood Sugar Monitoring, When to see the doctor, Exercise, Medication, [employer], [employer] Reviewed  Hgb A1c  [09/08/22 A1C 9.0%, 09/13/22 Creatinine 0.75, eGFR 82]  Nutrition Interventions   Nutrition Discussed/Reviewed Nutrition Discussed, Nutrition Reviewed, Carbohydrate meal planning, Portion sizes, Fluid intake, Decreasing sugar intake  [Plan 3 meals per day with 30 GM of  CHO and up to 2 snacks per day with less than 15 GM of CHO. Patient admits room for improvement.]  Pharmacy Interventions   Pharmacy Dicussed/Reviewed Pharmacy Topics Discussed, Pharmacy Topics Reviewed, Medications and their functions  [Patient is tolerating Lantus and Amaryl well. Blood sugars are much better in her opinion than when she was on Ozempic and Metformin. She is pleased with the progress.]       Follow up plan: Follow up call scheduled for 12/21/22    Encounter Outcome:  Pt. Visit Completed   Demetrios Loll, BSN, RN-BC RN Care Coordinator [facility]  [facility] Direct Dial: [phone] Main #: [phone]

## 2022-11-22 DIAGNOSIS — I10 Essential (primary) hypertension: Secondary | ICD-10-CM | POA: Diagnosis not present

## 2022-11-22 DIAGNOSIS — L03032 Cellulitis of left toe: Secondary | ICD-10-CM | POA: Diagnosis not present

## 2022-12-08 DIAGNOSIS — E1169 Type 2 diabetes mellitus with other specified complication: Secondary | ICD-10-CM | POA: Diagnosis not present

## 2022-12-08 DIAGNOSIS — M5459 Other low back pain: Secondary | ICD-10-CM | POA: Diagnosis not present

## 2022-12-08 DIAGNOSIS — E7849 Other hyperlipidemia: Secondary | ICD-10-CM | POA: Diagnosis not present

## 2022-12-08 DIAGNOSIS — N182 Chronic kidney disease, stage 2 (mild): Secondary | ICD-10-CM | POA: Diagnosis not present

## 2022-12-08 DIAGNOSIS — J301 Allergic rhinitis due to pollen: Secondary | ICD-10-CM | POA: Diagnosis not present

## 2022-12-08 DIAGNOSIS — Z0000 Encounter for general adult medical examination without abnormal findings: Secondary | ICD-10-CM | POA: Diagnosis not present

## 2022-12-08 DIAGNOSIS — I10 Essential (primary) hypertension: Secondary | ICD-10-CM | POA: Diagnosis not present

## 2022-12-09 DIAGNOSIS — M9903 Segmental and somatic dysfunction of lumbar region: Secondary | ICD-10-CM | POA: Diagnosis not present

## 2022-12-09 DIAGNOSIS — M9902 Segmental and somatic dysfunction of thoracic region: Secondary | ICD-10-CM | POA: Diagnosis not present

## 2022-12-09 DIAGNOSIS — M5137 Other intervertebral disc degeneration, lumbosacral region: Secondary | ICD-10-CM | POA: Diagnosis not present

## 2022-12-09 DIAGNOSIS — M9904 Segmental and somatic dysfunction of sacral region: Secondary | ICD-10-CM | POA: Diagnosis not present

## 2022-12-13 ENCOUNTER — Ambulatory Visit
Admission: RE | Admit: 2022-12-13 | Discharge: 2022-12-13 | Disposition: A | Discharge status: Home / Self Care | Payer: Medicare Other | Source: Ambulatory Visit | Attending: Internal Medicine | Admitting: Internal Medicine

## 2022-12-13 DIAGNOSIS — Z1231 Encounter for screening mammogram for malignant neoplasm of breast: Secondary | ICD-10-CM | POA: Diagnosis not present

## 2022-12-13 DIAGNOSIS — Z0000 Encounter for general adult medical examination without abnormal findings: Secondary | ICD-10-CM

## 2022-12-21 ENCOUNTER — Encounter: Payer: Self-pay | Admitting: *Deleted

## 2022-12-21 ENCOUNTER — Ambulatory Visit: Payer: Self-pay | Admitting: *Deleted

## 2022-12-21 NOTE — Patient Outreach (Signed)
Care Coordination   Follow Up Visit Note   12/21/2022 Name: Prerana Jodoin MRN: [id number] DOB: [birth year]  Kilia Figaro is a 77 y.o. year old female who sees Hasanaj, Myra Gianotti, MD for primary care. I spoke with  [name] by phone today.  What matters to the patients health and wellness today?  Improving chronic pain in neck and head and managing blood sugar.     Goals Addressed               This Visit's Progress     Patient Stated     Diabetes Management (pt-stated)        Care Coordination Goals: Patient will keep appointment with Dr Olena Leatherwood in December Patient will eat 3 meals per day with 30 GM of CHO and up to 2 snacks per day with less than 15 GM of CHO. Patient will increase activity level as tolerated with an ultimate goal of at least 150 minutes of exercise per week Currently limited by pain. Working with PCP to manage it.  Patient will reach out to RN Care Coordinator [phone] with any care coordination or resource needs          SDOH assessments and interventions completed:  Yes  SDOH Interventions Today    Flowsheet Row Most Recent Value  SDOH Interventions   Transportation Interventions Intervention Not Indicated  Financial Strain Interventions Intervention Not Indicated        Care Coordination Interventions:  Yes, provided   Follow up plan: Follow up call scheduled for 01/20/23    Demetrios Loll, RN, BSN Care Management Coordinator [facility]  [facility] Direct Dial: [phone] Main #: [phone]   Encounter Outcome:  Patient Visit Completed

## 2022-12-26 ENCOUNTER — Other Ambulatory Visit: Payer: Self-pay | Admitting: Internal Medicine

## 2023-01-20 ENCOUNTER — Ambulatory Visit: Payer: Self-pay | Admitting: *Deleted

## 2023-01-20 ENCOUNTER — Encounter: Payer: Self-pay | Admitting: *Deleted

## 2023-02-16 DIAGNOSIS — Z780 Asymptomatic menopausal state: Secondary | ICD-10-CM | POA: Diagnosis not present

## 2023-02-16 DIAGNOSIS — M810 Age-related osteoporosis without current pathological fracture: Secondary | ICD-10-CM | POA: Diagnosis not present

## 2023-02-16 DIAGNOSIS — M85852 Other specified disorders of bone density and structure, left thigh: Secondary | ICD-10-CM | POA: Diagnosis not present

## 2023-02-16 LAB — HM DEXA SCAN

## 2023-02-21 ENCOUNTER — Telehealth: Payer: Self-pay | Admitting: *Deleted

## 2023-02-21 ENCOUNTER — Encounter: Payer: Self-pay | Admitting: *Deleted

## 2023-02-21 NOTE — Progress Notes (Signed)
  Care Coordination Note  02/21/2023 Name: Thekla Durman MRN: [id number] DOB: 1945-07-20  Bhavna Lomax is a 77 y.o. year old female who is a primary care patient of Hasanaj, Myra Gianotti, MD and is actively engaged with the care management team. I reached out to [name] by phone today to assist with re-scheduling a follow up visit with the RN Case Manager  Follow up plan: Unsuccessful telephone outreach attempt made.   [facility]  Care Coordination Care Guide  Direct Dial: [phone]

## 2023-03-03 DIAGNOSIS — E1142 Type 2 diabetes mellitus with diabetic polyneuropathy: Secondary | ICD-10-CM | POA: Diagnosis not present

## 2023-03-03 DIAGNOSIS — M79676 Pain in unspecified toe(s): Secondary | ICD-10-CM | POA: Diagnosis not present

## 2023-03-03 DIAGNOSIS — B351 Tinea unguium: Secondary | ICD-10-CM | POA: Diagnosis not present

## 2023-03-03 DIAGNOSIS — L84 Corns and callosities: Secondary | ICD-10-CM | POA: Diagnosis not present

## 2023-03-07 NOTE — Progress Notes (Signed)
  Care Coordination Note  03/07/2023 Name: Arieyana Bredehoeft MRN: [id number] DOB: [birth year]  [name] is a 77 y.o. year old female who is a primary care patient of Hasanaj, Myra Gianotti, MD and is actively engaged with the care management team. I reached out to [name] by phone today to assist with re-scheduling a follow up visit with the RN Case Manager  Follow up plan: Telephone appointment with care management team member scheduled for:03/16/23  [facility] Coordination Care Guide  Direct Dial: [phone]

## 2023-03-09 DIAGNOSIS — E1122 Type 2 diabetes mellitus with diabetic chronic kidney disease: Secondary | ICD-10-CM | POA: Diagnosis not present

## 2023-03-09 DIAGNOSIS — J301 Allergic rhinitis due to pollen: Secondary | ICD-10-CM | POA: Diagnosis not present

## 2023-03-09 DIAGNOSIS — I10 Essential (primary) hypertension: Secondary | ICD-10-CM | POA: Diagnosis not present

## 2023-03-09 DIAGNOSIS — N182 Chronic kidney disease, stage 2 (mild): Secondary | ICD-10-CM | POA: Diagnosis not present

## 2023-03-09 DIAGNOSIS — M5459 Other low back pain: Secondary | ICD-10-CM | POA: Diagnosis not present

## 2023-03-09 DIAGNOSIS — E7849 Other hyperlipidemia: Secondary | ICD-10-CM | POA: Diagnosis not present

## 2023-03-11 DIAGNOSIS — R808 Other proteinuria: Secondary | ICD-10-CM | POA: Diagnosis not present

## 2023-03-11 NOTE — Progress Notes (Unsigned)
Cardiology Office Note Date:  03/11/2023  Patient ID:  Dominique Bauer, Dominique Bauer [birth year], MRN [id number] PCP:  Toma Deiters, MD  Cardiologist:  Dr. Eden Emms Electrophysiologist: Dr. Graciela Husbands     Chief Complaint: *** 80mo  History of Present Illness: Dominique Bauer is a 77 y.o. female with history of AFib, HTN, sleep apnea w/CPAP, HFpEF, chronic CHF, very remote DVT (post calcaneal fracture/casting)  I saw her Feb 2023 She tells me she does not feel great, but not new, and feels is 2/2 her weight and DM. She denies any CP, palpitations or cardiac awareness She does not think she has had Afib since on tikosyn. She has DOE with heavier activities, increased pace, stairs, this is her baseline for years, unchanged Uses her spironolactone PRN, infrequently, lasix also PRN, uses now and then No near syncope or syncope No bleeding No changes were made  She saw Dr. Graciela Husbands 03/09/22, c/o fatigue and BB was stopped started on diltiazem  I saw her 09/13/22 She is doing well Continues to be very happy with her Afib management, doesn't think she has had any in years. No CP, palpitations or cardiac awareness She has some degree of baseline DOE that is unchanged for years. No dizzy spells, near syncope or syncope. No bleeding or signs of bleeding She does not exercise, though no difficulties with her ADLs, groceries, laundry, house chores She is pending an colonoscopy, her PMD has recommended and prescribed an injection for the 2 days off her Eliquis, as far as she knows she will not miss both Tikosyn doses day of her colonoscopy Stable QT, no changes were made  Saw Dr. Eden Emms 7/2/224, edema controlled with infrequent use of PRN lasix, intol of ozempic with BS issues  Follows with Dr. Allyson Sabal for OSA  *** tiksoyn, EKG, meds, teaching, labs *** eliquis, bleeding, dose, labs *** volume  AAD Hx Diagnosed 2017 Flecainide failed to maintain SR, 2017 Tikosyn (started 2018)  Past  Medical History:  Diagnosis Date   Arthritis    osteo arthritis   Body mass index 40.0-44.9, adult (HCC)    Diabetes (HCC)    Dysrhythmia    H/O measles    H/O mumps    H/O: whooping cough    History of chicken pox    HLD (hyperlipidemia)    Hypertension    Persistent atrial fibrillation (HCC)    PONV (postoperative nausea and vomiting)    Sciatica    Sleep apnea    not using aything at night right now/LH    Past Surgical History:  Procedure Laterality Date   ABDOMINAL HYSTERECTOMY     BREAST BIOPSY Right 02/01/2012   BREAST EXCISIONAL BIOPSY Bilateral    benign   CARDIOVERSION N/A 11/17/2015   Procedure: CARDIOVERSION;  Surgeon: Wendall Stade, MD;  Location: [facility] ENDOSCOPY;  Service: Cardiovascular;  Laterality: N/A;   CARDIOVERSION N/A 01/07/2016   Procedure: CARDIOVERSION;  Surgeon: Wendall Stade, MD;  Location: [facility] ENDOSCOPY;  Service: Cardiovascular;  Laterality: N/A;   CARDIOVERSION N/A 04/29/2016   Procedure: CARDIOVERSION;  Surgeon: Thurmon Fair, MD;  Location: [facility] ENDOSCOPY;  Service: Cardiovascular;  Laterality: N/A;   CATARACT EXTRACTION W/PHACO Left 07/20/2021   Procedure: CATARACT EXTRACTION PHACO AND INTRAOCULAR LENS PLACEMENT (IOC);  Surgeon: Fabio Pierce, MD;  Location: [facility] ORS;  Service: Ophthalmology;  Laterality: Left;  CDE: 10.07   CATARACT EXTRACTION W/PHACO Right 08/03/2021   Procedure: CATARACT EXTRACTION PHACO AND INTRAOCULAR LENS PLACEMENT (IOC);  Surgeon: Fabio Pierce, MD;  Location: [facility] ORS;  Service: Ophthalmology;  Laterality: Right;  CDE 7.93   COLONOSCOPY WITH PROPOFOL N/A 09/20/2022   Procedure: COLONOSCOPY WITH PROPOFOL;  Surgeon: Dolores Frame, MD;  Location: [facility] ENDO SUITE;  Service: Gastroenterology;  Laterality: N/A;  9:30am;asa 3   DG  BONE DENSITY ([facility] HX)     INNER EAR SURGERY Left    ear drum replacement X2   NASAL SEPTUM SURGERY     POLYPECTOMY  09/20/2022   Procedure: POLYPECTOMY;  Surgeon: Marguerita Merles, Reuel Boom, MD;   Location: [facility] ENDO SUITE;  Service: Gastroenterology;;  cold snare, cold biopsy    Current Outpatient Medications  Medication Sig Dispense Refill   acetaminophen (TYLENOL) 500 MG tablet Take 500-1,000 mg by mouth every 8 (eight) hours as needed for moderate pain.     apixaban (ELIQUIS) 5 MG TABS tablet Take 5 mg by mouth 2 (two) times daily.     Calcium Carbonate-Vit D-Min (CALTRATE PLUS PO) Take 1 capsule by mouth in the morning, at noon, and at bedtime.     cetirizine (ZYRTEC) 10 MG tablet Take 10 mg by mouth daily.     cholecalciferol (VITAMIN D3) 25 MCG (1000 UNIT) tablet Take 1,000 Units by mouth daily.     dofetilide (TIKOSYN) 125 MCG capsule TAKE THREE CAPSULES BY MOUTH TWICE DAILY (LEAVE in stock bottles) 540 capsule 3   Fluticasone-Salmeterol (ADVAIR) 100-50 MCG/DOSE AEPB Inhale 1 puff into the lungs daily as needed (shortness of breath).     furosemide (LASIX) 40 MG tablet Take 1 tablet (40 mg total) by mouth every other day. as needed x 2 doses for swelling. (Patient taking differently: Take 40 mg by mouth daily as needed for fluid or edema.) 30 tablet 8   glimepiride (AMARYL) 2 MG tablet Take 4 mg by mouth in the morning and at bedtime.     guaiFENesin (MUCINEX) 600 MG 12 hr tablet Take 600 mg by mouth daily.     HYDROcodone-acetaminophen (NORCO/VICODIN) 5-325 MG tablet Take 1 tablet by mouth every 6 (six) hours as needed for moderate pain.     insulin glargine (LANTUS) 100 UNIT/ML injection Inject 20 Units into the skin daily.     L-Lysine 500 MG TABS Take 500 mg by mouth in the morning, at noon, and at bedtime.     levalbuterol (XOPENEX) 0.63 MG/3ML nebulizer solution Take 0.63 mg by nebulization every 6 (six) hours as needed for wheezing or shortness of breath.     losartan (COZAAR) 100 MG tablet Take 100 mg by mouth daily.     magnesium oxide (MAG-OX) 400 MG tablet TAKE 1 TABLET BY MOUTH TWICE A DAY 180 tablet 3   metoprolol tartrate (LOPRESSOR) 25 MG tablet Take 12.5-25 mg by  mouth See admin instructions. Take 25 mg by mouth in the morning and 12.5 mg in the evening     multivitamin-lutein (OCUVITE-LUTEIN) CAPS capsule Take 1 capsule by mouth daily.     Omega-3 Fatty Acids (FISH OIL) 1000 MG CAPS Take 1,000 mg by mouth 3 (three) times daily.      triamcinolone cream (KENALOG) 0.1 % Apply 1 application  topically daily as needed (rash).     No current facility-administered medications for this visit.    Allergies:   Penicillins, Other, and Neosporin [neomycin-bacitracin zn-polymyx]   Social History:  The patient  reports that she has never smoked. She has been exposed to tobacco smoke. She has never used smokeless tobacco. She reports that she does not drink alcohol  and does not use drugs.   Family History:  The patient's family history includes Breast cancer in her maternal aunt and sister; Colon cancer in her maternal grandmother, paternal aunt, and paternal aunt; Hypertension in her mother.  ROS:  Please see the history of present illness.    All other systems are reviewed and otherwise negative.   PHYSICAL EXAM:  VS:  There were no vitals taken for this visit. BMI: There is no height or weight on file to calculate BMI. Well nourished, well developed, in no acute distress HEENT: normocephalic, atraumatic Neck: no JVD, carotid bruits or masses Cardiac: *** RRR; no significant murmurs, no rubs, or gallops Lungs: *** CTA b/l, no wheezing, rhonchi or rales Abd: soft, nontender MS: no deformity or atrophy Ext: *** she has compression stocking on her LLE (chronically post DVT years ago), no edema appreciated RLE (reports some chronic swelling LLE since remote DVT) Skin: warm and dry, no rash Neuro:  No gross deficits appreciated Psych: euthymic mood, full affect   EKG:  Done today and reviewed by myself shows  ***    08/14/2020: TTE IMPRESSIONS   1. Left ventricular ejection fraction, by estimation, is 60 to 65%. The  left ventricle has normal function.  The left ventricle has no regional  wall motion abnormalities. The left ventricular internal cavity size was  mildly dilated. Left ventricular  diastolic parameters are consistent with Grade I diastolic dysfunction  (impaired relaxation).   2. Right ventricular systolic function is normal. The right ventricular  size is normal. Tricuspid regurgitation signal is inadequate for assessing  PA pressure.   3. Left atrial size was moderately dilated.   4. Right atrial size was mildly dilated.   5. The mitral valve is normal in structure. No evidence of mitral valve  regurgitation. No evidence of mitral stenosis.   6. The aortic valve is tricuspid. Aortic valve regurgitation is not  visualized. No aortic stenosis is present.   Comparison(s): No significant change from prior study.    12/04/2015: EST Blood pressure demonstrated a normal response to exercise. There was no ST segment deviation noted during stress. Negative, adequate stress test   Recent Labs: 09/13/2022: BUN 13; Creatinine, Ser 0.75; Hemoglobin 13.4; Magnesium 1.8; Platelets 255; Potassium 4.8; Sodium 134  No results found for requested labs within last 365 days.   CrCl cannot be calculated (Patient's most recent lab result is older than the maximum 21 days allowed.).   Wt Readings from Last 3 Encounters:  11/15/22 261 lb (118.4 kg)  09/28/22 255 lb 9.6 oz (115.9 kg)  09/20/22 257 lb 15 oz (117 kg)     Other studies reviewed: Additional studies/records reviewed today include: summarized above  ASSESSMENT AND PLAN:  Paroxysmal Afib CHA2DS2Vasc is 5, on Eliquis, appropriately dosed Tikosyn *** stable QTc *** No/low burden by symptoms   Chronic CHF HFpEF *** No exam findings or symptoms of volume OL  HTN *** *** I have asked her to monitor her BP a few times a week at home and let us/her PMD know if regularly elevated  5. Secondary hypercoagulable state     Disposition: *** back in 6months otherwise,  sooner if needed  Current medicines are reviewed at length with the patient today.  The patient did not have any concerns regarding medicines.  Norma Fredrickson, PA-C 03/11/2023 4:11 PM     [facility] [address] [place] [address] [phone] (office)  [phone] (fax)

## 2023-03-14 ENCOUNTER — Ambulatory Visit: Payer: Medicare Other | Attending: Physician Assistant | Admitting: Physician Assistant

## 2023-03-14 VITALS — BP 130/60 | HR 56 | Ht 66.0 in | Wt 261.0 lb

## 2023-03-14 DIAGNOSIS — Z5181 Encounter for therapeutic drug level monitoring: Secondary | ICD-10-CM

## 2023-03-14 DIAGNOSIS — D6869 Other thrombophilia: Secondary | ICD-10-CM

## 2023-03-14 DIAGNOSIS — I480 Paroxysmal atrial fibrillation: Secondary | ICD-10-CM | POA: Diagnosis not present

## 2023-03-14 DIAGNOSIS — Z79899 Other long term (current) drug therapy: Secondary | ICD-10-CM | POA: Diagnosis not present

## 2023-03-14 DIAGNOSIS — I5032 Chronic diastolic (congestive) heart failure: Secondary | ICD-10-CM | POA: Diagnosis not present

## 2023-03-14 DIAGNOSIS — I10 Essential (primary) hypertension: Secondary | ICD-10-CM

## 2023-03-14 NOTE — Patient Instructions (Addendum)
Medication Instructions:   Your physician recommends that you continue on your current medications as directed. Please refer to the Current Medication list given to you today.  *If you need a refill on your cardiac medications before your next appointment, please call your pharmacy*   Lab Work:  PLEASE GO DOWN STAIRS FIRST FLOOR  [address] :  BMET  MAG  CBC TODAY    If you have labs (blood work) drawn today and your tests are completely normal, you will receive your results only by: MyChart Message (if you have MyChart) OR A paper copy in the mail If you have any lab test that is abnormal or we need to change your treatment, we will call you to review the results.   Testing/Procedures:  NONE ORDERED  TODAY     Follow-Up: At [facility], you and your health needs are our priority.  As part of our continuing mission to provide you with exceptional heart care, we have created designated Provider Care Teams.  These Care Teams include your primary Cardiologist (physician) and Advanced Practice Providers (APPs -  Physician Assistants and Nurse Practitioners) who all work together to provide you with the care you need, when you need it.  We recommend signing up for the patient portal called "MyChart".  Sign up information is provided on this After Visit Summary.  MyChart is used to connect with patients for Virtual Visits (Telemedicine).  Patients are able to view lab/test results, encounter notes, upcoming appointments, etc.  Non-urgent messages can be sent to your provider as well.   To learn more about what you can do with MyChart, go to [email or web address].    Your next appointment:    6 month(s)  Provider:  DrGraciela Husbands or one of the following Advanced Practice Providers on your designated Care Team:   Francis Dowse, PA-C Canary Brim, NP    Other Instructions

## 2023-03-15 LAB — CBC
Hematocrit: 39 % (ref 34.0–46.6)
Hemoglobin: 12.8 g/dL (ref 11.1–15.9)
MCH: 29.2 pg (ref 26.6–33.0)
MCHC: 32.8 g/dL (ref 31.5–35.7)
MCV: 89 fL (ref 79–97)
Platelets: 260 10*3/uL (ref 150–450)
RBC: 4.38 x10E6/uL (ref 3.77–5.28)
RDW: 12.4 % (ref 11.7–15.4)
WBC: 10.2 10*3/uL (ref 3.4–10.8)

## 2023-03-15 LAB — BASIC METABOLIC PANEL
BUN/Creatinine Ratio: 20 (ref 12–28)
BUN: 17 mg/dL (ref 8–27)
CO2: 24 mmol/L (ref 20–29)
Calcium: 10.2 mg/dL (ref 8.7–10.3)
Chloride: 99 mmol/L (ref 96–106)
Creatinine, Ser: 0.83 mg/dL (ref 0.57–1.00)
Glucose: 186 mg/dL — ABNORMAL HIGH (ref 70–99)
Potassium: 4.6 mmol/L (ref 3.5–5.2)
Sodium: 137 mmol/L (ref 134–144)
eGFR: 73 mL/min/{1.73_m2} (ref >59)

## 2023-03-15 LAB — MAGNESIUM: Magnesium: 2 mg/dL (ref 1.6–2.3)

## 2023-03-16 ENCOUNTER — Ambulatory Visit: Payer: Self-pay | Admitting: *Deleted

## 2023-03-16 ENCOUNTER — Encounter: Payer: Self-pay | Admitting: *Deleted

## 2023-03-16 NOTE — Patient Outreach (Signed)
Care Coordination   Follow Up Visit Note   03/16/2023 Name: Dominique Bauer MRN: [id number] DOB: Aug 04, 1945  Dominique Bauer is a 77 y.o. year old female who sees Hasanaj, Myra Gianotti, MD for primary care. I spoke with  [name] by phone today.  What matters to the patients health and wellness today?  Managing blood sugar    Goals Addressed               This Visit's Progress     Patient Stated     Diabetes Management (pt-stated)   Not on track     Care Coordination Goals: Patient folllow-up with Dr Olena Leatherwood in 3 months and sooner if necessary Patient will eat 3 meals per day with 30 GM of CHO and up to 2 snacks per day with less than 15 GM of CHO. Patient will consider talking with Norm Salt who is a Editor, commissioning Diabetes Educator Patient will avoid sugary drinks and snacks Patient will review printed and mailed diet education Patient will increase activity level as tolerated with an ultimate goal of at least 150 minutes of exercise per week Currently limited by pain. Working with PCP to manage it.  Patient will reach out to RN Care Coordinator [phone] with any care coordination or resource needs        Other     Increase Physical Activity Level   Not on track     Care Coordination Goals Patient will consider using her Silver Sneakers benefit through her insurance company at the [employer] exercises since she has osteoarthritis and chronic joint pain Patient has not looked into this but is considering Patient will increase activity level slowly with an ultimate goal of at least 150 minutes of moderate activity per week Patient will reach out to RN Care Coordinator [phone] with any resource or care coordination needs        SDOH assessments and interventions completed:  Yes  SDOH Interventions Today    Flowsheet Row Most Recent Value  SDOH Interventions   Food Insecurity Interventions  Intervention Not Indicated  Transportation Interventions Intervention Not Indicated  Financial Strain Interventions Intervention Not Indicated        Care Coordination Interventions:  Yes, provided  Interventions Today    Flowsheet Row Most Recent Value  Chronic Disease   Chronic disease during today's visit Diabetes  General Interventions   General Interventions Discussed/Reviewed General Interventions Discussed, General Interventions Reviewed, Health Screening, Labs, Doctor Visits, Durable Medical Equipment (DME), Annual Eye Exam, Annual Foot Exam  Labs Hgb A1c every 6 months, Kidney Function  [Saw Dr Olena Leatherwood in December but did not have A1C]  Doctor Visits Discussed/Reviewed Doctor Visits Reviewed, Doctor Visits Discussed, PCP, Annual Wellness Visits, Specialist  Health Screening Bone Density, Mammogram  [up to date. Abstracted date of Dexa scan into [facility]]  Durable Medical Equipment (DME) Glucomoter  [blood sugar was 202 this morning]  PCP/Specialist Visits Compliance with follow-up visit  [follow-up with Dr Camelia Eng in 3 months and sooner if necessary]  Exercise Interventions   Exercise Discussed/Reviewed Exercise Discussed, Exercise Reviewed, Physical Activity  Physical Activity Discussed/Reviewed Physical Activity Discussed, Physical Activity Reviewed  [performs ADLs independently. Exercise is limited by joint pain, particularly knees, due to arthritis]  Education Interventions   Education Provided Provided Education, Provided Printed Education  [Printed: Diabetes Mellitus and Nutrition, Adult ([facility]),  Healthy Eating and the Holidays (CDC), &  Put the Brakes on Diabetes Complications (CDC)]  Provided Verbal Education On Other, Exercise, Nutrition, Labs, Blood Sugar Monitoring, When to see the doctor, Medication  [encouraged to consider talking with Norm Salt, Registered Dietician and Certified Diabetes Educator]  Labs Reviewed Hgb A1c, Kidney Function  [did not have A1C at visit with  PCP in December. 03/14/23 CBC and magnesium were WNL. Patient reports that urine microalbumin done at [facility] was negative. No results to review in [facility] tool.]  Mental Health Interventions   Mental Health Discussed/Reviewed Mental Health Discussed, Mental Health Reviewed, Other  [discussed motivation to make changes in diet to improve blood sugar control]  Nutrition Interventions   Nutrition Discussed/Reviewed Nutrition Discussed, Nutrition Reviewed, Portion sizes, Decreasing sugar intake, Increasing proteins, Carbohydrate meal planning, Adding fruits and vegetables, Fluid intake  [3 meals per day with 30 GM of CHO and up to 2 snacks per day with less than 15 GM of CHO. Avoid sugary drinks and snacks. Recognize that it can be more difficult to manage diet during Holiday season]  Pharmacy Interventions   Pharmacy Dicussed/Reviewed Pharmacy Topics Discussed, Pharmacy Topics Reviewed, Medications and their functions  Safety Interventions   Safety Discussed/Reviewed Safety Discussed, Safety Reviewed       Follow up plan: Follow up call scheduled for 04/28/23    Encounter Outcome:  Patient Visit Completed   Demetrios Loll, RN, BSN Care Manager [place]  [facility]  Population Health  Direct Dial: [phone] Main #: [phone]

## 2023-03-16 NOTE — Patient Outreach (Signed)
Care Coordination   Follow Up Visit Note   01/20/2023 Name: Dominique Bauer MRN: [id number] DOB: 1945/07/17  Dominique Bauer is a 77 y.o. year old female who sees Hasanaj, Myra Gianotti, MD for primary care. I spoke with  [name] by phone today.  What matters to the patients health and wellness today?  Managing blood sugar    Goals Addressed               This Visit's Progress     Patient Stated     Diabetes Management (pt-stated)   Not on track     Care Coordination Goals: Patient will keep appointment with Dr Olena Leatherwood in December Patient will eat 3 meals per day with 30 GM of CHO and up to 2 snacks per day with less than 15 GM of CHO. Patient will review printed and mailed diet education Patient will increase activity level as tolerated with an ultimate goal of at least 150 minutes of exercise per week Currently limited by pain. Working with PCP to manage it.  Patient will reach out to RN Care Coordinator [phone] with any care coordination or resource needs        Other     Increase Physical Activity Level   Not on track     Care Coordination Goals Patient will consider using her Silver Sneakers benefit through her insurance company at the [employer] exercises since she has osteoarthritis and chronic joint pain Patient has not looked into this but is considering Patient will increase activity level slowly with an ultimate goal of at least 150 minutes of moderate activity per week Patient will reach out to RN Care Coordinator [phone] with any resource or care coordination needs        SDOH assessments and interventions completed:  Yes  SDOH Interventions Today    Flowsheet Row Most Recent Value  SDOH Interventions   Housing Interventions Intervention Not Indicated  Transportation Interventions Intervention Not Indicated        Care Coordination Interventions:  Yes, provided  Interventions Today    Flowsheet Row  Most Recent Value  Chronic Disease   Chronic disease during today's visit Diabetes  General Interventions   General Interventions Discussed/Reviewed General Interventions Discussed, General Interventions Reviewed, Labs, Durable Medical Equipment (DME), Doctor Visits, Health Screening  Labs Hgb A1c every 3 months  Doctor Visits Discussed/Reviewed Doctor Visits Discussed, Doctor Visits Reviewed, Annual Wellness Visits, PCP, Specialist  Health Screening Bone Density, Mammogram  Durable Medical Equipment (DME) Glucomoter  [blood sugar 185]  PCP/Specialist Visits Compliance with follow-up visit  [PCP in December]  Exercise Interventions   Exercise Discussed/Reviewed Physical Activity  Physical Activity Discussed/Reviewed Physical Activity Discussed, Physical Activity Reviewed  [encouraged to increase as tolerated with an ultimate goal of 150 minutes per week]  Education Interventions   Education Provided Provided Education  Provided Verbal Education On Nutrition, Blood Sugar Monitoring, Medication, When to see the doctor, Exercise  Nutrition Interventions   Nutrition Discussed/Reviewed Nutrition Discussed, Nutrition Reviewed, Carbohydrate meal planning, Adding fruits and vegetables, Fluid intake, Portion sizes, Decreasing sugar intake  Pharmacy Interventions   Pharmacy Dicussed/Reviewed Pharmacy Topics Discussed, Pharmacy Topics Reviewed, Medications and their functions  Safety Interventions   Safety Discussed/Reviewed Safety Discussed, Safety Reviewed, Fall Risk, Home Safety       Follow up plan: Follow up call scheduled for 03/16/23    Encounter Outcome:  Patient Visit Completed   Demetrios Loll, RN, BSN Care Manager [facility]  [facility]  Population Health  Direct Dial: [phone] Main #: [phone]

## 2023-03-17 ENCOUNTER — Other Ambulatory Visit: Payer: Self-pay

## 2023-03-17 ENCOUNTER — Telehealth: Payer: Self-pay | Admitting: Cardiovascular Disease

## 2023-03-17 DIAGNOSIS — G4733 Obstructive sleep apnea (adult) (pediatric): Secondary | ICD-10-CM

## 2023-03-17 NOTE — Telephone Encounter (Signed)
Patient is calling to follow up

## 2023-03-17 NOTE — Progress Notes (Signed)
Order for new machine and supplies sent to [employer] today.

## 2023-03-17 NOTE — Telephone Encounter (Signed)
What problem are you experiencing? Patient states that she needs new CPAP prescription  Who is your medical equipment company? [employer]  3)    If patient is calling about their sleep study results please route to [facility] Sleep Study Pool.   Please route to the sleep study coordinator.

## 2023-03-26 ENCOUNTER — Other Ambulatory Visit: Payer: Self-pay | Admitting: Physician Assistant

## 2023-04-28 ENCOUNTER — Encounter: Payer: Self-pay | Admitting: *Deleted

## 2023-04-28 DIAGNOSIS — E119 Type 2 diabetes mellitus without complications: Secondary | ICD-10-CM | POA: Diagnosis not present

## 2023-05-11 ENCOUNTER — Encounter: Payer: Self-pay | Admitting: *Deleted

## 2023-05-11 ENCOUNTER — Ambulatory Visit: Payer: Self-pay | Admitting: *Deleted

## 2023-05-11 NOTE — Patient Outreach (Signed)
Care Coordination   Follow Up Visit Note   05/11/2023 Name: Dominique Bauer MRN: [id number] DOB: 08/19/1945  Dominique Bauer is a 78 y.o. year old female who sees Hasanaj, Myra Gianotti, MD for primary care. I spoke with  [name] by phone today.  What matters to the patients health and wellness today?  Patient did not endorse any specific questions or concerns today.   Goals Addressed               This Visit's Progress     Patient Stated     Diabetes Management (pt-stated)   On track     Care Coordination Goals: Patient folllow-up with Dr Olena Leatherwood in March as scheduled Patient will eat 3 meals per day with 30 GM of CHO and up to 2 snacks per day with less than 15 GM of CHO. Patient will avoid sugary drinks and snacks Patient will reach out to provider with any readings less than 70 or for 3 readings in a row over 200 Patient will increase activity level as tolerated with an ultimate goal of at least 150 minutes of exercise per week. Currently limited by pain. Working with PCP to manage it.  Patient will reach out to RN Care Coordinator [phone] with any care coordination or resource needs        Other     Increase Physical Activity Level   Not on track     Care Coordination Goals Patient will consider using her Silver Sneakers benefit through her insurance company at the [employer] exercises since she has osteoarthritis and chronic joint pain Patient has not looked into this but is considering Patient will increase activity level slowly with an ultimate goal of at least 150 minutes of moderate activity per week Patient will reach out to RN Care Coordinator [phone] with any resource or care coordination needs        SDOH assessments and interventions completed:  Yes  SDOH Interventions Today    Flowsheet Row Most Recent Value  SDOH Interventions   Transportation Interventions Intervention Not Indicated  Physical Activity  Interventions [employer]        Care Coordination Interventions:  Yes, provided  Interventions Today    Flowsheet Row Most Recent Value  Chronic Disease   Chronic disease during today's visit Diabetes  General Interventions   General Interventions Discussed/Reviewed General Interventions Discussed, General Interventions Reviewed, Labs, Annual Eye Exam, Durable Medical Equipment (DME), Doctor Visits  Labs Hgb A1c every 6 months  Doctor Visits Discussed/Reviewed Doctor Visits Discussed, Doctor Visits Reviewed, Annual Wellness Visits, PCP  Durable Medical Equipment (DME) Glucomoter  [no readings less than 70. Ocassional readings over 200.]  PCP/Specialist Visits Compliance with follow-up visit  [PCP appointment scheduled for beginning of March]  Exercise Interventions   Exercise Discussed/Reviewed Physical Activity, Exercise Discussed, Exercise Reviewed  [does not exercise. Pain interferes. Encouraged to increase as tolerated. Silver Sneakers benefit can be used at [employer]. Water aerobics are easier on the joints.]  Physical Activity Discussed/Reviewed Physical Activity Discussed, Physical Activity Reviewed  Education Interventions   Education Provided Provided Education  Provided Verbal Education On Nutrition, Labs, Eye Care, Foot Care, Medication, Exercise, When to see the doctor, Blood Sugar Monitoring  Labs Reviewed Hgb A1c  [repeat A1C at PCP visit in March]  Nutrition Interventions   Nutrition Discussed/Reviewed Nutrition Discussed, Nutrition Reviewed, Adding fruits and vegetables, Carbohydrate meal planning, Fluid intake, Portion sizes, Decreasing sugar intake  Pharmacy Interventions  Pharmacy Dicussed/Reviewed Pharmacy Topics Discussed, Pharmacy Topics Reviewed, Medications and their functions  [taking medications as prescribed. Takes hydrocodone sparingly as needed for joint pain.]  Safety Interventions   Safety Discussed/Reviewed Safety Discussed, Safety Reviewed, Fall Risk,  Home Safety  Home Safety Assistive Devices       Follow up plan: Follow up call scheduled for 06/08/23    Encounter Outcome:  Patient Visit Completed   Demetrios Loll, RN, BSN [place]  [facility], [facility] RN Care Manager Direct Dial: [phone]

## 2023-05-17 DIAGNOSIS — E1142 Type 2 diabetes mellitus with diabetic polyneuropathy: Secondary | ICD-10-CM | POA: Diagnosis not present

## 2023-05-17 DIAGNOSIS — L84 Corns and callosities: Secondary | ICD-10-CM | POA: Diagnosis not present

## 2023-05-17 DIAGNOSIS — B351 Tinea unguium: Secondary | ICD-10-CM | POA: Diagnosis not present

## 2023-05-17 DIAGNOSIS — M79676 Pain in unspecified toe(s): Secondary | ICD-10-CM | POA: Diagnosis not present

## 2023-06-08 ENCOUNTER — Encounter: Payer: Self-pay | Admitting: *Deleted

## 2023-06-08 ENCOUNTER — Ambulatory Visit: Payer: Self-pay | Admitting: *Deleted

## 2023-06-08 ENCOUNTER — Encounter: Payer: Self-pay | Admitting: Pharmacist

## 2023-06-08 DIAGNOSIS — M5459 Other low back pain: Secondary | ICD-10-CM | POA: Diagnosis not present

## 2023-06-08 DIAGNOSIS — Z0000 Encounter for general adult medical examination without abnormal findings: Secondary | ICD-10-CM | POA: Diagnosis not present

## 2023-06-08 DIAGNOSIS — I10 Essential (primary) hypertension: Secondary | ICD-10-CM | POA: Diagnosis not present

## 2023-06-08 DIAGNOSIS — E7849 Other hyperlipidemia: Secondary | ICD-10-CM | POA: Diagnosis not present

## 2023-06-08 DIAGNOSIS — N182 Chronic kidney disease, stage 2 (mild): Secondary | ICD-10-CM | POA: Diagnosis not present

## 2023-06-08 DIAGNOSIS — J301 Allergic rhinitis due to pollen: Secondary | ICD-10-CM | POA: Diagnosis not present

## 2023-06-08 DIAGNOSIS — R001 Bradycardia, unspecified: Secondary | ICD-10-CM | POA: Diagnosis not present

## 2023-06-08 DIAGNOSIS — E1122 Type 2 diabetes mellitus with diabetic chronic kidney disease: Secondary | ICD-10-CM | POA: Diagnosis not present

## 2023-06-08 NOTE — Patient Outreach (Addendum)
 Care Coordination   Follow Up Visit Note   06/08/2023 Name: Dominique Bauer MRN: [id number] DOB: 1946/02/25  Dominique Bauer is a 78 y.o. year old female who sees Hasanaj, Myra Gianotti, MD for primary care. I spoke with  [name] by phone today.  What matters to the patients health and wellness today?  Cost of medications, is her medication causing her hair to fall out, episodes of "weak" heartbeat.     Goals Addressed               This Visit's Progress     Patient Stated     Diabetes Management (pt-stated)   On track     Care Coordination Goals: Patient folllow-up with Dr Olena Leatherwood today  Patient will have A1C checked at office visit today Patient will eat 3 meals per day with 30 GM of CHO and up to 2 snacks per day with less than 15 GM of CHO. Patient will avoid sugary drinks and snacks Patient will increase activity level as tolerated with an ultimate goal of at least 150 minutes of exercise per week. Currently limited by pain. Working with PCP to manage it.  Patient will reach out to RN Care Coordinator [phone] with any care coordination or resource needs        Other     Increase Physical Activity Level   Not on track     Care Coordination Goals Patient will consider using her Silver Sneakers benefit through her insurance company at the [employer] exercises since she has osteoarthritis and chronic joint pain Patient has not looked into this but is considering Patient will increase activity level slowly with an ultimate goal of at least 150 minutes of moderate activity per week Patient will reach out to RN Care Coordinator [phone] with any resource or care coordination needs      Manage Afib        Care Management Goals: Patient will take mediations as prescribed Patient will review handout on Medicare Prescription Payment Plan and discuss with RNCM during next telephone call Eliquis and Tikosyn combined cost close to $800  for a 3 months supply $590 deductible and $2000 out-of-pocket maximum Patient will keep all medical appointments Patient will talk with cardiologist regarding any new or worsening symptoms Reports rare episodes where heartbeat feels "weak". Associated symptom of SOB. Usually occurs during periods of stress or when she's overly tired. Resolves on it's own.  Patient will seek emergency medical attention if needed Patient will talk with cardiology clinical pharmacist about concerns that medication may be causing hair loss Concerned that it may be losartan. This isn't likely. This can be a side effect of metoprolol.  Patient will reach out to RN Care Manager at [phone] with any resource or care management needs        SDOH assessments and interventions completed:  Yes  SDOH Interventions Today    Flowsheet Row Most Recent Value  SDOH Interventions   Food Insecurity Interventions Intervention Not Indicated  Housing Interventions Intervention Not Indicated  Transportation Interventions Intervention Not Indicated  Physical Activity Interventions [employer]  [limited by chronic pain.]        Care Coordination Interventions:  Yes, provided  Interventions Today    Flowsheet Row Most Recent Value  Chronic Disease   Chronic disease during today's visit Atrial Fibrillation (AFib), Diabetes  General Interventions   General Interventions Discussed/Reviewed General Interventions Discussed, General Interventions Reviewed, Durable Medical Equipment (DME), Doctor Visits,  Labs, Communication with  [reviewed and discussed recent cardiology visit. Reviewed and disussed last TEE in 2022. Rare episodes of "weak" hearbeat associated with SOB. Occurs when she is stressed or overly tired. Denies irregular rate or rhythm. Resolves on it's own.]  Labs Hgb A1c every 3 months  Doctor Visits Discussed/Reviewed Doctor Visits Discussed, Doctor Visits Reviewed, Annual Wellness Visits, PCP, Specialist   [reviewed and discussed recent cardiology visit. Reviewed and disussed last TEE in 2022.]  Durable Medical Equipment (DME) Glucomoter  [No blood sugar readings less than 70 or above 200]  PCP/Specialist Visits Compliance with follow-up visit  [PCP appointment scheduled for today. Dr Tresa Endo ([facility]iology [facility]) on 06/21/23. Francis Dowse, PA-C (cardiology) on 09/13/23]  Communication with PCP/Specialists, Pharmacists  [Staff message to Francis Dowse, PA-C Re: feeling of "weak" hearbeat associated with SOB. Staff message to Cardiology Pharmacy Pool Re: patient's concern that hair loss is a side effect of medication]  Exercise Interventions   Exercise Discussed/Reviewed Physical Activity, Exercise Discussed, Exercise Reviewed  Physical Activity Discussed/Reviewed Physical Activity Discussed, Physical Activity Reviewed  [able to perform ADLs independently. Exercise is limited by chronic pain. Encouraged to increase as tolerated. Water aerobics are a good option.]  Education Interventions   Education Provided Provided Education, Provided Therapist, sports  [Printed information on [employer] Prescription Payment Plan]  Provided Verbal Education On Labs, Medication, When to see the doctor, Exercise, Blood Sugar Monitoring, Mental Health/Coping with Illness, Insurance Plans  Labs Reviewed Hgb A1c, Kidney Function  [08/2022 A1C 9%]  Mental Health Interventions   Mental Health Discussed/Reviewed Mental Health Reviewed, Mental Health Discussed, Other  [Has noticed increased hair loss that is bothersome. Feels that it may be a mediation side effect.]  Nutrition Interventions   Nutrition Discussed/Reviewed Nutrition Discussed, Nutrition Reviewed, Portion sizes, Adding fruits and vegetables, Carbohydrate meal planning, Fluid intake, Decreasing sugar intake  Pharmacy Interventions   Pharmacy Dicussed/Reviewed Pharmacy Topics Discussed, Pharmacy Topics Reviewed, Affording Medications  [Eliquis & Tikosyn together were  almost $800 for 3 month supply. Discussed deductible & $2000 out-of-pocket max. Estimated when she may meet that. Explained Medicare Prescription Payment Plan. Explained Eliqus Pt. assistance requirements]  Safety Interventions   Safety Discussed/Reviewed Safety Discussed, Safety Reviewed, Fall Risk, Home Safety  Home Safety Assistive Devices       Follow up plan: Follow up call scheduled for 06/29/23    Encounter Outcome:  Patient Visit Completed   Demetrios Loll, RN, BSN [place]  [facility], [facility] Health RN Care Manager Direct Dial: [phone]

## 2023-06-10 DIAGNOSIS — N182 Chronic kidney disease, stage 2 (mild): Secondary | ICD-10-CM | POA: Diagnosis not present

## 2023-06-10 DIAGNOSIS — R0602 Shortness of breath: Secondary | ICD-10-CM | POA: Diagnosis not present

## 2023-06-10 DIAGNOSIS — E1122 Type 2 diabetes mellitus with diabetic chronic kidney disease: Secondary | ICD-10-CM | POA: Diagnosis not present

## 2023-06-10 DIAGNOSIS — Z0000 Encounter for general adult medical examination without abnormal findings: Secondary | ICD-10-CM | POA: Diagnosis not present

## 2023-06-10 DIAGNOSIS — R001 Bradycardia, unspecified: Secondary | ICD-10-CM | POA: Diagnosis not present

## 2023-06-10 LAB — HEMOGLOBIN A1C: Hemoglobin A1C: 9.3

## 2023-06-21 ENCOUNTER — Ambulatory Visit: Payer: Medicare Other | Attending: Cardiovascular Disease | Admitting: Cardiovascular Disease

## 2023-06-21 ENCOUNTER — Encounter: Payer: Self-pay | Admitting: Cardiovascular Disease

## 2023-06-21 DIAGNOSIS — D6869 Other thrombophilia: Secondary | ICD-10-CM

## 2023-06-21 DIAGNOSIS — R600 Localized edema: Secondary | ICD-10-CM | POA: Diagnosis not present

## 2023-06-21 DIAGNOSIS — I10 Essential (primary) hypertension: Secondary | ICD-10-CM

## 2023-06-21 DIAGNOSIS — G4733 Obstructive sleep apnea (adult) (pediatric): Secondary | ICD-10-CM | POA: Diagnosis not present

## 2023-06-21 DIAGNOSIS — I480 Paroxysmal atrial fibrillation: Secondary | ICD-10-CM

## 2023-06-21 MED ORDER — METOPROLOL SUCCINATE ER 25 MG PO TB24: 12.5000 mg | ORAL_TABLET | Freq: Every day | ORAL | 3 refills | Status: AC

## 2023-06-21 NOTE — Progress Notes (Signed)
 Cardiology Office Note    Date:  06/21/2023   ID:  Dominique, Bauer 01-10-1946, MRN [id number]  PCP:  Toma Deiters, MD  Cardiologist:  Nicki Guadalajara, MD (sleep), Dr. Eden Emms, Dr.. Graciela Husbands (EP)  7 month F/U sleep evaluation  History of Present Illness:  Dominique Bauer is a 78 y.o. female who presents for a 7 month follow-up sleep evaluation.  Dominique Bauer is a patient of Dr. Eden Emms and and Dr. Graciela Husbands who has a history of hypertension, and atrial fibrillation.  She had a long history of persistent atrial fibrillation and had failed previous antiarrhythmic therapy and cardioversions, but recently underwent successful cardioversion with dofetilide.  Her left atrial size is mildly enlarged at 43 mm.  Due to concerns for sleep apnea she was referred for diagnostic sleep study which was done on 02/15/2016.  This revealed severe obstructive sleep apnea with an AHI of 31.2 per hour.  Sleep apnea was more severe during REM sleep with an AHI of 58.3 per hour and with supine position at 46.1 per hour.  She had significant oxygen desaturation to a nadir of 69% on her diagnostic study.  She socially underwent a CPAP titration trial on 05/05/2016.  It was initially recommended that she use CPAP auto with a minimum pressure of 10 and maximum pressure of 18 cm water pressure with heated humidification.  She was unable to tolerate the high pressures and ultimately this was reduced to a maximum pressure of 16.  She has been using a fullface mask.  A download was obtained from 06/21/2016 through 07/20/2016.  This demonstrated excellent compliance with 100% usage stays at 97% of days with usage greater than 4 hours.  However, she was only sleeping 4 hours and 30 minutes.  There was no significant leak.  Her 95th percentile pressure was 13.4 with a maximum average pressure of 14.2. Upon further questioning, she states that she typically goes to bed between 11 PM and midnight and often is in bed until 10  AM in the morning.  When I saw her, she was not using CPAP through the duration of her time in bed.  She felt improved since initiating CPAP.   I saw her in July 2018 and prior to that visit she was having difficulty with her mask with tenderness on the bridge of her nose.  At that time, I also had significant discussion with her and recommended the new Respironics dream were full face mask.  Apparently, [facility].  At the time they did not have this new mask which had just become available.  Over the past several months, she has significantly increased her sleep duration with CPAP use.  I obtained a new download in the office  from July 1-30, 2018 which demonstrated 100% compliancec with usage stays and uses greater than 4 hours and averaging 6 hours and 57 minutes of CPAP use per night.  She has ResMed AirSense 10 Auto system and has a minimum pressure setting at 10 and maximum of 14.  Her 95th percent.  Average pressure was 13.2.  At times she still having increased leak around her nasal bridge.  Her AHI was 1.1.  She was been unaware of any recurrent atrial fibrillation.    I saw her in October 2019 at which time continued to use CPAP with 100% compliance.  I obtained a new download from September 22 through January 16, 2018 which confirmed 100%  compliance.  Her ResMed air sense auto set is set at a minimum pressure of 10 with maximum of 14.  95th percentile pressure is 11.6 with a maximum average pressure of 12.1.  AHI remains excellent at 0.5.  She feels well.  She denies any residual daytime sleepiness.  A new Epworth scale was calculated in the office  and this endorsed at 7.  She was unaware of breakthrough snoring.  She denied any  bruxism, restless legs, hypnagogic hallucinations or cataplexy.    When I saw her in November 2020 she continued to do well. She last saw Dr. Cyndie Chime in September 2020 and was maintaining sinus rhythm on Tikosyn and was without QTC  prolongation.  Her hypertension was controlled and she was on a low-sodium Dash type diet.  Presently, she continues to use CPAP.  A new download was obtained from October 13 through February 07, 2019 which confirms excellent compliance.  However she does have a mask leak.  She has been using a full facemask.  Her CPAP has been set at a minimum pressure of 10 with a maximum pressure of 14.  95 percentile pressure is 11.0 with a maximum average pressure of 11.8.  AHI is excellent at 0.4.  [employer] is her DME company who brought out [facility].  Of note, for for family members including a daughter, son-in-law, and 2 grandchildren have come down with COVID-19 infection.  She has not had any recent exposure to them.  During her evaluation, due to her significant mask leak I recommended changing her fullface mask to a ResMed air fit F 30i mask.  I saw her on May 15, 2020.  She felt well and she admits to excellent compliance with her CPAP therapy and has not had any recurrent episodes of arrhythmia.  I obtained a download from April 14, 2020 through through May 13, 2020.  Compliance is 100% with average use at 8 hours and 23 minutes.  Her CPAP is set at a range of 10 to 14 cm of water and AHI is excellent at 0.8.  95th percentile pressure is 12.0 with maximum average pressure 12.8 cm of water.  Presently she goes to bed around 1 AM and wakes up around 11 AM.  She is sleeping well.  She denies breakthrough snoring.  A new Epworth Sleepiness Scale score was calculated in the office today and this endorsed at 5 arguing against residual daytime sleepiness.  States her blood pressure has been stable and she continues to tolerate Tikosyn without breakthrough atrial arrhythmia.  I saw her on June 04, 2021.  Since my last evaluation, she has been seen by Dr. Graciela Husbands as well as Dr. Eden Emms and last saw Francis Dowse, PA-C in February 2023.  She is unaware of any recurrent atrial fibrillation.  She admits to  bilateral lower extremity edema.  She is no longer taking spironolactone and takes furosemide 40 mg 1 time per week.  She continues to be on losartan 50 mg, metoprolol tartrate 25 mg in the morning and 12.5 mg in the evening Tikosyn and takes three 125 mg capsules twice a day.  She is anticoagulated on Eliquis.  She is no longer taking Nexletol.  She is diabetic.  She continues to use CPAP with 100% compliance.  A new download was obtained in the office today which showed 10 percent compliance with average use at 8 hours and 20 minutes.  Typically she goes to bed at 1 AM and wakes up around  10.  Her pressure is set at a range of 10 to 14 cm of water and her 95th percentile pressure is 11.1 with maximum average pressure 11.8.  AHI is excellent at 0.6.  She does have mild mask leak.  She now has her old fullface mask and did not seem to like the F30i mask any better.    I last saw her on November 15, 2022.  At that time she continued to be compliant with her CPAP therapy., she has continued to be compliant with CPAP therapy.  [employer] is her DME company.  Her set up date was May 20, 2016.  She has been contacted by [employer] about getting a new CPAP device.  I obtained a download from July 21 through November 15, 2022.  Compliance is excellent at 100% with average use of 7 hours and 43 minutes.  CPAP is set at a pressure range of 10 to 14 cm of water.  AHI is excellent at 0.6.  95th percentile pressure is 11.8 with maximum average pressure 12.3.  She is using a fullface mask.  She continues to be followed by Dr. Eden Emms and last saw him on September 28, 2022 and was stable.  She presents for follow-up evaluation.  Since I last saw Ms. Spinner, she received a new ResMed AirSense 11 AutoSet unit on April 27, 2023.  [employer] is her DME company who also now works with [employer].  A download was obtained from February 22 through June 19, 2023.  She has been compliant standards with 100% use.  She is averaging 7 hours and 36 minutes.   Her pressure is set at a range of 10 to 14 cm of water and AHI 0.9.  Her 95th percentile pressure is 13.1 with maximum average pressure 13.4.  She does have a circadian rhythm delay.  She typically stays up until 2 AM and may sleep until 11 AM.  An Epworth scale was calculated in the office today and this endorsed at 5.  Since I saw her, she has seen her primary physician Dr. Rosetta Posner.  She has been maintaining sinus rhythm but continues to be on Eliquis for anticoagulation.  She is on dofetilide 3 capsules by mouth twice a day.  She takes furosemide on a as needed basis for swelling.  She is on losartan 100 mg and was on metoprolol to tartrate 25 mg in the morning and 12.5 mg in the evening.  Due to pulse in the 50s her primary care doctor discontinued her evening dose of metoprolol to tartrate.  She tells me her blood pressure blood pressure had increased slightly but ultimately he ended up discontinuing the morning dose of metoprolol to tartrate as well.  She is scheduled to see Wynonia Hazard in EP clinic on April 3 for evaluation.  She states lately her blood pressure at home has been elevated.  She presents for evaluation.  Past Medical History:  Diagnosis Date   Arthritis    osteo arthritis   Body mass index 40.0-44.9, adult (HCC)    Diabetes (HCC)    Dysrhythmia    H/O measles    H/O mumps    H/O: whooping cough    History of chicken pox    HLD (hyperlipidemia)    Hypertension    Persistent atrial fibrillation (HCC)    PONV (postoperative nausea and vomiting)    Sciatica    Sleep apnea    not using aything at night right now/LH    Past Surgical  History:  Procedure Laterality Date   ABDOMINAL HYSTERECTOMY     BREAST BIOPSY Right 02/01/2012   BREAST EXCISIONAL BIOPSY Bilateral    benign   CARDIOVERSION N/A 11/17/2015   Procedure: CARDIOVERSION;  Surgeon: Wendall Stade, MD;  Location: [facility] ENDOSCOPY;  Service: Cardiovascular;  Laterality: N/A;   CARDIOVERSION N/A 01/07/2016   Procedure:  CARDIOVERSION;  Surgeon: Wendall Stade, MD;  Location: [facility] ENDOSCOPY;  Service: Cardiovascular;  Laterality: N/A;   CARDIOVERSION N/A 04/29/2016   Procedure: CARDIOVERSION;  Surgeon: Thurmon Fair, MD;  Location: [facility] ENDOSCOPY;  Service: Cardiovascular;  Laterality: N/A;   CATARACT EXTRACTION W/PHACO Left 07/20/2021   Procedure: CATARACT EXTRACTION PHACO AND INTRAOCULAR LENS PLACEMENT (IOC);  Surgeon: Fabio Pierce, MD;  Location: [facility] ORS;  Service: Ophthalmology;  Laterality: Left;  CDE: 10.07   CATARACT EXTRACTION W/PHACO Right 08/03/2021   Procedure: CATARACT EXTRACTION PHACO AND INTRAOCULAR LENS PLACEMENT (IOC);  Surgeon: Fabio Pierce, MD;  Location: [facility] ORS;  Service: Ophthalmology;  Laterality: Right;  CDE 7.93   COLONOSCOPY WITH PROPOFOL N/A 09/20/2022   Procedure: COLONOSCOPY WITH PROPOFOL;  Surgeon: Dolores Frame, MD;  Location: [facility] ENDO SUITE;  Service: Gastroenterology;  Laterality: N/A;  9:30am;asa 3   DG  BONE DENSITY ([facility] HX)     INNER EAR SURGERY Left    ear drum replacement X2   NASAL SEPTUM SURGERY     POLYPECTOMY  09/20/2022   Procedure: POLYPECTOMY;  Surgeon: Marguerita Merles, Reuel Boom, MD;  Location: [facility] ENDO SUITE;  Service: Gastroenterology;;  cold snare, cold biopsy    Current Medications: Outpatient Medications Prior to Visit  Medication Sig Dispense Refill   acetaminophen (TYLENOL) 500 MG tablet Take 500-1,000 mg by mouth every 8 (eight) hours as needed for moderate pain.     apixaban (ELIQUIS) 5 MG TABS tablet Take 5 mg by mouth 2 (two) times daily.     Calcium Carbonate-Vit D-Min (CALTRATE PLUS PO) Take 1 capsule by mouth in the morning, at noon, and at bedtime.     cetirizine (ZYRTEC) 10 MG tablet Take 10 mg by mouth daily.     cholecalciferol (VITAMIN D3) 25 MCG (1000 UNIT) tablet Take 1,000 Units by mouth daily.     dofetilide (TIKOSYN) 125 MCG capsule TAKE THREE CAPSULES BY MOUTH TWICE DAILY (LEAVE in stock bottles) 540 capsule 3   Fluticasone-Salmeterol  (ADVAIR) 100-50 MCG/DOSE AEPB Inhale 1 puff into the lungs daily as needed (shortness of breath).     furosemide (LASIX) 40 MG tablet TAKE 1 TABLET BY MOUTH EVERY OTHER DAY, AS NEEDED FOR 2  DOSES  FOR  SWELLING 90 tablet 3   glimepiride (AMARYL) 2 MG tablet Take 4 mg by mouth in the morning and at bedtime.     guaiFENesin (MUCINEX) 600 MG 12 hr tablet Take 600 mg by mouth daily.     HYDROcodone-acetaminophen (NORCO/VICODIN) 5-325 MG tablet Take 1 tablet by mouth every 6 (six) hours as needed for moderate pain.     insulin glargine (LANTUS) 100 UNIT/ML injection Inject 20 Units into the skin daily.     L-Lysine 500 MG TABS Take 500 mg by mouth in the morning, at noon, and at bedtime.     levalbuterol (XOPENEX) 0.63 MG/3ML nebulizer solution Take 0.63 mg by nebulization every 6 (six) hours as needed for wheezing or shortness of breath.     losartan (COZAAR) 100 MG tablet Take 100 mg by mouth daily.     magnesium oxide (MAG-OX) 400 MG tablet TAKE 1 TABLET  BY MOUTH TWICE A DAY 180 tablet 3   multivitamin-lutein (OCUVITE-LUTEIN) CAPS capsule Take 1 capsule by mouth daily.     Omega-3 Fatty Acids (FISH OIL) 1000 MG CAPS Take 1,000 mg by mouth 3 (three) times daily.      triamcinolone cream (KENALOG) 0.1 % Apply 1 application  topically daily as needed (rash).     metoprolol tartrate (LOPRESSOR) 25 MG tablet Take 12.5-25 mg by mouth See admin instructions. Take 25 mg by mouth in the morning and 12.5 mg in the evening     No facility-administered medications prior to visit.     Allergies:   Penicillins, Other, and Neosporin [neomycin-bacitracin zn-polymyx]   Social History   Socioeconomic History   Marital status: Married    Spouse name: Not on file   Number of children: Not on file   Years of education: Not on file   Highest education level: Not on file  Occupational History   Not on file  Tobacco Use   Smoking status: Never    Passive exposure: Past   Smokeless tobacco: Never  Vaping Use    Vaping status: Never Used  Substance and Sexual Activity   Alcohol use: No   Drug use: No   Sexual activity: Yes  Other Topics Concern   Not on file  Social History Narrative   Retired Engineer, civil (consulting).  Lives in [place].   Social Drivers of Corporate investment banker Strain: Low Risk  (03/16/2023)   Overall Financial Resource Strain (CARDIA)    Difficulty of Paying Living Expenses: Not very hard  Food Insecurity: No Food Insecurity (06/08/2023)   Hunger Vital Sign    Worried About Running Out of Food in the Last Year: Never true    Ran Out of Food in the Last Year: Never true  Transportation Needs: No Transportation Needs (06/08/2023)   PRAPARE - Administrator, Civil Service (Medical): No    Lack of Transportation (Non-Medical): No  Physical Activity: Inactive (06/08/2023)   Exercise Vital Sign    Days of Exercise per Week: 0 days    Minutes of Exercise per Session: 0 min  Stress: Not on file  Social Connections: Not on file     Family History:  The patient's family history includes Breast cancer in her maternal aunt and sister; Colon cancer in her maternal grandmother, paternal aunt, and paternal aunt; Hypertension in her mother.   ROS General: Negative; No fevers, chills, or night sweats; morbid obesity HEENT: Negative; No changes in vision or hearing, sinus congestion, difficulty swallowing Pulmonary: Negative; No cough, wheezing, shortness of breath, hemoptysis Cardiovascular: No recurrent AF; positive for bilateral lower extremity edema GI: Negative; No nausea, vomiting, diarrhea, or abdominal pain GU: Negative; No dysuria, hematuria, or difficulty voiding Musculoskeletal: Negative; no myalgias, joint pain, or weakness Hematologic/Oncology: Negative; no easy bruising, bleeding Endocrine: Positive for diabetes Neuro: Negative; no changes in balance, headaches Skin: Negative; No rashes or skin lesions Psychiatric: Negative; No behavioral problems, depression Sleep:See  HPI Other comprehensive 14 point system review is negative.   PHYSICAL EXAM:   VS:  BP 136/88   Pulse 70   Ht 5\' 7"  (1.702 m)   Wt 261 lb 12.8 oz (118.8 kg)   SpO2 97%   BMI 41.00 kg/m   Repeat blood pressure by me was 132/64  Wt Readings from Last 3 Encounters:  06/21/23 261 lb 12.8 oz (118.8 kg)  03/14/23 261 lb (118.4 kg)  11/15/22 261 lb (118.4 kg)  General: Alert, oriented, no distress.  Morbid obesity Skin: normal turgor, no rashes, warm and dry HEENT: Normocephalic, atraumatic. Pupils equal round and reactive to light; sclera anicteric; extraocular muscles intact; Nose without nasal septal hypertrophy Mouth/Parynx benign; Mallinpatti scale 4 Neck: No JVD, no carotid bruits; normal carotid upstroke Lungs: clear to ausculatation and percussion; no wheezing or rales Chest wall: without tenderness to palpitation Heart: PMI not displaced, RRR, s1 s2 normal, 1/6 systolic murmur, no diastolic murmur, no rubs, gallops, thrills, or heaves Abdomen: soft, nontender; no hepatosplenomehaly, BS+; abdominal aorta nontender and not dilated by palpation. Back: no CVA tenderness Pulses 2+ Musculoskeletal: full range of motion, normal strength, no joint deformities Extremities: Trace lower extremity edema; no clubbing cyanosis, Homan's sign negative  Neurologic: grossly nonfocal; Cranial nerves grossly wnl Psychologic: Normal mood and affect    Studies/Labs Reviewed:   EKG Interpretation Date/Time:  Tuesday June 21 2023 14:15:20 EDT Ventricular Rate:  70 PR Interval:  202 QRS Duration:  98 QT Interval:  424 QTC Calculation: 457 R Axis:   -46  Text Interpretation: Normal sinus rhythm Pulmonary disease pattern Left anterior fascicular block Minimal voltage criteria for LVH, may be normal variant ( Cornell product ) Septal infarct (cited on or before 15-Nov-2022) When compared with ECG of 14-Mar-2023 15:25, Premature atrial complexes are no longer Present Nonspecific T wave  abnormality no longer evident in Anterior leads Confirmed by Nicki Guadalajara ([id number]) on 06/21/2023 2:32:36 PM    November 15, 2022 ECG (independently read by me): Sinus bradycardia 56 bpm with first-degree AV block  June 04, 2021 ECG (independently read by me): SInus rhythm at 66; 1st degree AV block, PR 214 msec; QTc 467 msec.  May 15, 2020 ECG (independently read by me): Sinus bradycardia at 59; 1st degree AV block, PR interval 210 ms; QTC interval 435 ms.  November 2020 ECG (independently read by me): NSR at 75; First degree AV block PR 216 msec; QS V1-2.      October 2019 ECG (independently read by me): Sinus bradycardia at 56 bpm.  First-degree AV block with a PR interval of 210 ms.  Q waves inferiorly with preserved R waves  July 2018 ECG (independently read by me): Sinus bradycardia 53 bpm.  PR interval 192 ms.  QTc interval 457 ms.  Nonspecific T changes in lead 3.  April 2018 ECG (independently read by me): Sinus bradycardia 56 bpm.  Left axis deviation.  T-wave inversion in lead 3.  QS in V2.  QTc interval 420 ms.  PR interval 196 ms.  Recent Labs:    Latest Ref Rng & Units 03/14/2023    4:23 PM 09/13/2022    4:33 PM 03/09/2022    4:43 PM  BMP  Glucose 70 - 99 mg/dL 604  540  981   BUN 8 - 27 mg/dL 17  13  13    Creatinine 0.57 - 1.00 mg/dL 1.91  4.78  2.95   BUN/Creat Ratio 12 - 28 20  17  18    Sodium 134 - 144 mmol/L 137  134  139   Potassium 3.5 - 5.2 mmol/L 4.6  4.8  5.0   Chloride 96 - 106 mmol/L 99  99  100   CO2 20 - 29 mmol/L 24  22  25    Calcium 8.7 - 10.3 mg/dL 62.1  30.8  65.7          No data to display             Latest  Ref Rng & Units 03/14/2023    4:23 PM 09/13/2022    4:33 PM 03/09/2022    4:43 PM  CBC  WBC 3.4 - 10.8 x10E3/uL 10.2  10.7  12.5   Hemoglobin 11.1 - 15.9 g/dL 16.1  09.6  04.5   Hematocrit 34.0 - 46.6 % 39.0  38.9  40.0   Platelets 150 - 450 x10E3/uL 260  255  285    Lab Results  Component Value Date   MCV 89 03/14/2023    MCV 88 09/13/2022   MCV 89 03/09/2022   No results found for: "TSH" No results found for: "HGBA1C"   BNP No results found for: "BNP"  ProBNP    Component Value Date/Time   PROBNP 270 08/12/2021 1423     Lipid Panel  No results found for: "CHOL", "TRIG", "HDL", "CHOLHDL", "VLDL", "LDLCALC", "LDLDIRECT"   RADIOLOGY: No results found.   Additional studies/ records that were reviewed today include:  I had previously  read the office notes of Drs. Graciela Husbands admission, and reviewed her diagnostic polysomnogram as well as CPAP titration trial.  I obtained a download of her CPAP unit in the office today for compliance and efficacy assessment.  In the office today I obtained a new download from July 1 through 10/25/2016.   ASSESSMENT:    1. OSA on CPAP   2. Paroxysmal atrial fibrillation (HCC)   3. Primary hypertension   4. Bilateral lower extremity edema   5. Secondary hypercoagulable state (HCC)   6. Morbid obesity [facility])     PLAN:  Ms. Dhrithi Riche is a 78 year-old female who has a history of hypertension, and previous persistent atrial fibrillation which proved refractory to additional antiarrhythmics and prior cardioversions, but ultimately she was successfully cardioverted following a tikosyn load.  She was found to have severe obstructive sleep apnea with an overall AHI of 31.2 per hour and very severe sleep apnea during REM sleep with an AHI of 58.3 per hour.  She had marked oxygen desaturation to a nadir of 69% on her diagnostic study.  When I saw her for her initial sleep evaluation she was meeting compliance but was sleeping an inadequate duration at only 4 hours and 30 minutes.  In prior evaluations I have had a lengthy discussion with her regarding the effects of obstructive sleep apnea on cardiovascular health and particularly with reference to its risk for atrial fibrillation and increased incidence of recurrence of atrial fibrillation if untreated.  When I saw her in  August 2024, she was 100% compliant with CPAP use.  AHI was 0.6 and CPAP pressure was set at a range of 10 to 14 cm with 95th percentile pressure 11.8 and maximum average pressure 12.3.  She received a new ResMed AirSense 11 AutoSet unit on April 27, 2023.  Her download today confirms excellent compliance with 100% use.  AHI 0.9.  Her 95th percentile pressure is 13.1 with maximum average pressure 13.4.  I will make slight adjustment in her CPAP range and will we will change this to a range of 10 to 16 cm of water in the event she required slight higher pressure than 14.  I discussed her circadian rhythm delay and strongly recommending trying to get back on her hours at a time to a more normal sleep pattern.  She has noticed her blood pressure being more elevated since metoprolol to tartrate was discontinued by Dr. Olena Leatherwood.  She had been on metoprolol tartrate 25 mg in the morning and  12 point 5 at night, and later this has been completely discontinued.  Presently I will institute metoprolol succinate and give her 25 mg pill.  However for the next 2 weeks she will just take 12.5 mg until she sees Francis Dowse back in follow-up and depending upon blood pressure and heart rate dose can be adjusted at that time.  Her ECG today is stable with sinus rhythm at 70 bpm.  She has mild lower extremity edema and has a prescription for furosemide which she has not recently taken but takes on a as needed basis.  She will return to the primary cardiology care of Dr.Nishan and she sees Dr. Graciela Husbands for EP.  I discussed with her my upcoming retirement in several months.  She will need to see Dr. Mayford Knife in the future from a sleep perspective.    Medication Adjustments/Labs and Tests Ordered: Current medicines are reviewed at length with the patient today.  Concerns regarding medicines are outlined above.  Medication changes, Labs and Tests ordered today are listed in the Patient Instructions below.  Patient Instructions   Medication Instructions:   Begin Metoprolol Succinate 25.mg. take one half tablet (12.5mg ) daily.   *If you need a refill on your cardiac medications before your next appointment, please call your pharmacy*   Lab Work: No labs were ordered during today's visit.  If you have labs (blood work) drawn today and your tests are completely normal, you will receive your results only by: MyChart Message (if you have MyChart) OR A paper copy in the mail If you have any lab test that is abnormal or we need to change your treatment, we will call you to review the results.   Testing/Procedures: No procedures were ordered during today's visit.    Follow-Up: At [facility], you and your health needs are our priority.  As part of our continuing mission to provide you with exceptional heart care, we have created designated Provider Care Teams.  These Care Teams include your primary Cardiologist (physician) and Advanced Practice Providers (APPs -  Physician Assistants and Nurse Practitioners) who all work together to provide you with the care you need, when you need it.  We recommend signing up for the patient portal called "MyChart".  Sign up information is provided on this After Visit Summary.  MyChart is used to connect with patients for Virtual Visits (Telemedicine).  Patients are able to view lab/test results, encounter notes, upcoming appointments, etc.  Non-urgent messages can be sent to your provider as well.   To learn more about what you can do with MyChart, go to [email or web address].     Keep scheduled appointment  If you have any questions or concerns regarding your c-pap, bi-pap or sleep accessories, please contact Brandie Rorie at [phone].      Signed, Nicki Guadalajara, MD, [facility] ABSM Diplomate, American Board of Sleep Medicine  06/21/2023 3:20 PM    [facility] [address], [address], [place], [place]  [address] Phone: [phone]

## 2023-06-21 NOTE — Patient Instructions (Signed)
 Medication Instructions:   Begin Metoprolol Succinate 25.mg. take one half tablet (12.5mg ) daily.   *If you need a refill on your cardiac medications before your next appointment, please call your pharmacy*   Lab Work: No labs were ordered during today's visit.  If you have labs (blood work) drawn today and your tests are completely normal, you will receive your results only by: MyChart Message (if you have MyChart) OR A paper copy in the mail If you have any lab test that is abnormal or we need to change your treatment, we will call you to review the results.   Testing/Procedures: No procedures were ordered during today's visit.    Follow-Up: At [facility], you and your health needs are our priority.  As part of our continuing mission to provide you with exceptional heart care, we have created designated Provider Care Teams.  These Care Teams include your primary Cardiologist (physician) and Advanced Practice Providers (APPs -  Physician Assistants and Nurse Practitioners) who all work together to provide you with the care you need, when you need it.  We recommend signing up for the patient portal called "MyChart".  Sign up information is provided on this After Visit Summary.  MyChart is used to connect with patients for Virtual Visits (Telemedicine).  Patients are able to view lab/test results, encounter notes, upcoming appointments, etc.  Non-urgent messages can be sent to your provider as well.   To learn more about what you can do with MyChart, go to [email or web address].     Keep scheduled appointment  If you have any questions or concerns regarding your c-pap, bi-pap or sleep accessories, please contact Brandie Rorie at [phone].

## 2023-06-28 NOTE — Progress Notes (Unsigned)
 Cardiology Office Note Date:  06/28/2023  Patient ID:  Dominique Bauer, Dominique Bauer 03/01/1946, MRN [id number] PCP:  Toma Deiters, MD  Cardiologist:  Dr. Eden Emms Electrophysiologist: Dr. Graciela Husbands   Chief Complaint:  *** f/u on symptoms, tikosyn visit  History of Present Illness: Dominique Bauer is a 78 y.o. female with history of AFib, HTN, sleep apnea w/CPAP, HFpEF, chronic CHF, very remote DVT (post calcaneal fracture/casting)  I saw her Feb 2023 She tells me she does not feel great, but not new, and feels is 2/2 her weight and DM. She denies any CP, palpitations or cardiac awareness She does not think she has had Afib since on tikosyn. She has DOE with heavier activities, increased pace, stairs, this is her baseline for years, unchanged Uses her spironolactone PRN, infrequently, lasix also PRN, uses now and then No near syncope or syncope No bleeding No changes were made  She saw Dr. Graciela Husbands 03/09/22, c/o fatigue and BB was stopped started on diltiazem  I saw her 09/13/22 She is doing well Continues to be very happy with her Afib management, doesn't think she has had any in years. No CP, palpitations or cardiac awareness She has some degree of baseline DOE that is unchanged for years. No dizzy spells, near syncope or syncope. No bleeding or signs of bleeding She does not exercise, though no difficulties with her ADLs, groceries, laundry, house chores She is pending an colonoscopy, her PMD has recommended and prescribed an injection for the 2 days off her Eliquis, as far as she knows she will not miss both Tikosyn doses day of her colonoscopy Stable QT, no changes were made  Saw Dr. Eden Emms 7/2/224, edema controlled with infrequent use of PRN lasix, intol of ozempic with BS issues  Follows with Dr. Allyson Sabal for OSA  I saw her 03/14/23 She is doing very well Infrequently has some orthostatic dizziness, no near syncope or syncope, feels like she hydrates well No CP, no  palpitations/AFib No rest SOB or DOE with ADLs, perhaps a little winded with heavier activities, this is her baseline No bleeding or signs of bleeding Reports good medication compliance Stable intervals Planned for 6 mo visit  Care coordinator nurse reached out after a conversation with the patient 06/08/23, mentioning weak heart beat associated with SOB, infrequent and brief, we offered her an earlier visit to come in earlier, though she preferred to wait.  *** tikosyn EKG, labs *** symptoms?? *** teaching *** meds   AAD Hx Diagnosed 2017 Flecainide failed to maintain SR, 2017 Tikosyn (started 2018)  Past Medical History:  Diagnosis Date   Arthritis    osteo arthritis   Body mass index 40.0-44.9, adult (HCC)    Diabetes (HCC)    Dysrhythmia    H/O measles    H/O mumps    H/O: whooping cough    History of chicken pox    HLD (hyperlipidemia)    Hypertension    Persistent atrial fibrillation (HCC)    PONV (postoperative nausea and vomiting)    Sciatica    Sleep apnea    not using aything at night right now/LH    Past Surgical History:  Procedure Laterality Date   ABDOMINAL HYSTERECTOMY     BREAST BIOPSY Right 02/01/2012   BREAST EXCISIONAL BIOPSY Bilateral    benign   CARDIOVERSION N/A 11/17/2015   Procedure: CARDIOVERSION;  Surgeon: Wendall Stade, MD;  Location: [facility] ENDOSCOPY;  Service: Cardiovascular;  Laterality: N/A;   CARDIOVERSION N/A 01/07/2016  Procedure: CARDIOVERSION;  Surgeon: Wendall Stade, MD;  [facility];  Service: Cardiovascular;  Laterality: N/A;   CARDIOVERSION N/A 04/29/2016   Procedure: CARDIOVERSION;  Surgeon: Thurmon Fair, MD;  Location: [facility] ENDOSCOPY;  Service: Cardiovascular;  Laterality: N/A;   CATARACT EXTRACTION W/PHACO Left 07/20/2021   Procedure: CATARACT EXTRACTION PHACO AND INTRAOCULAR LENS PLACEMENT (IOC);  Surgeon: Fabio Pierce, MD;  Location: [facility] ORS;  Service: Ophthalmology;  Laterality: Left;  CDE: 10.07   CATARACT  EXTRACTION W/PHACO Right 08/03/2021   Procedure: CATARACT EXTRACTION PHACO AND INTRAOCULAR LENS PLACEMENT (IOC);  Surgeon: Fabio Pierce, MD;  Location: [facility] ORS;  Service: Ophthalmology;  Laterality: Right;  CDE 7.93   COLONOSCOPY WITH PROPOFOL N/A 09/20/2022   Procedure: COLONOSCOPY WITH PROPOFOL;  Surgeon: Dolores Frame, MD;  Location: [facility] ENDO SUITE;  Service: Gastroenterology;  Laterality: N/A;  9:30am;asa 3   DG  BONE DENSITY ([facility] HX)     INNER EAR SURGERY Left    ear drum replacement X2   NASAL SEPTUM SURGERY     POLYPECTOMY  09/20/2022   Procedure: POLYPECTOMY;  Surgeon: Marguerita Merles, Reuel Boom, MD;  Location: [facility] ENDO SUITE;  Service: Gastroenterology;;  cold snare, cold biopsy    Current Outpatient Medications  Medication Sig Dispense Refill   acetaminophen (TYLENOL) 500 MG tablet Take 500-1,000 mg by mouth every 8 (eight) hours as needed for moderate pain.     apixaban (ELIQUIS) 5 MG TABS tablet Take 5 mg by mouth 2 (two) times daily.     Calcium Carbonate-Vit D-Min (CALTRATE PLUS PO) Take 1 capsule by mouth in the morning, at noon, and at bedtime.     cetirizine (ZYRTEC) 10 MG tablet Take 10 mg by mouth daily.     cholecalciferol (VITAMIN D3) 25 MCG (1000 UNIT) tablet Take 1,000 Units by mouth daily.     dofetilide (TIKOSYN) 125 MCG capsule TAKE THREE CAPSULES BY MOUTH TWICE DAILY (LEAVE in stock bottles) 540 capsule 3   Fluticasone-Salmeterol (ADVAIR) 100-50 MCG/DOSE AEPB Inhale 1 puff into the lungs daily as needed (shortness of breath).     furosemide (LASIX) 40 MG tablet TAKE 1 TABLET BY MOUTH EVERY OTHER DAY, AS NEEDED FOR 2  DOSES  FOR  SWELLING 90 tablet 3   glimepiride (AMARYL) 2 MG tablet Take 4 mg by mouth in the morning and at bedtime.     guaiFENesin (MUCINEX) 600 MG 12 hr tablet Take 600 mg by mouth daily.     HYDROcodone-acetaminophen (NORCO/VICODIN) 5-325 MG tablet Take 1 tablet by mouth every 6 (six) hours as needed for moderate pain.     insulin glargine  (LANTUS) 100 UNIT/ML injection Inject 20 Units into the skin daily.     L-Lysine 500 MG TABS Take 500 mg by mouth in the morning, at noon, and at bedtime.     levalbuterol (XOPENEX) 0.63 MG/3ML nebulizer solution Take 0.63 mg by nebulization every 6 (six) hours as needed for wheezing or shortness of breath.     losartan (COZAAR) 100 MG tablet Take 100 mg by mouth daily.     magnesium oxide (MAG-OX) 400 MG tablet TAKE 1 TABLET BY MOUTH TWICE A DAY 180 tablet 3   metoprolol succinate (TOPROL XL) 25 MG 24 hr tablet Take 0.5 tablets (12.5 mg total) by mouth daily. 45 tablet 3   multivitamin-lutein (OCUVITE-LUTEIN) CAPS capsule Take 1 capsule by mouth daily.     Omega-3 Fatty Acids (FISH OIL) 1000 MG CAPS Take 1,000 mg by mouth 3 (three) times  daily.      triamcinolone cream (KENALOG) 0.1 % Apply 1 application  topically daily as needed (rash).     No current facility-administered medications for this visit.    Allergies:   Penicillins, Other, and Neosporin [neomycin-bacitracin zn-polymyx]   Social History:  The patient  reports that she has never smoked. She has been exposed to tobacco smoke. She has never used smokeless tobacco. She reports that she does not drink alcohol and does not use drugs.   Family History:  The patient's family history includes Breast cancer in her maternal aunt and sister; Colon cancer in her maternal grandmother, paternal aunt, and paternal aunt; Hypertension in her mother.  ROS:  Please see the history of present illness.    All other systems are reviewed and otherwise negative.   PHYSICAL EXAM:  VS:  There were no vitals taken for this visit. BMI: There is no height or weight on file to calculate BMI. Well nourished, well developed, in no acute distress HEENT: normocephalic, atraumatic Neck: no JVD, carotid bruits or masses Cardiac:  *** RRR; no significant murmurs, no rubs, or gallops Lungs: *** CTA b/l, no wheezing, rhonchi or rales Abd: soft, nontender MS: no  deformity or atrophy Ext: *** Unchanged: she has compression stocking on her LLE (chronically post DVT years ago), no edema appreciated RLE (reports some chronic swelling LLE since remote DVT) Skin: warm and dry, no rash Neuro:  No gross deficits appreciated Psych: euthymic mood, full affect   EKG:  Done today and reviewed by myself shows  ***   08/14/2020: TTE IMPRESSIONS   1. Left ventricular ejection fraction, by estimation, is 60 to 65%. The  left ventricle has normal function. The left ventricle has no regional  wall motion abnormalities. The left ventricular internal cavity size was  mildly dilated. Left ventricular  diastolic parameters are consistent with Grade I diastolic dysfunction  (impaired relaxation).   2. Right ventricular systolic function is normal. The right ventricular  size is normal. Tricuspid regurgitation signal is inadequate for assessing  PA pressure.   3. Left atrial size was moderately dilated.   4. Right atrial size was mildly dilated.   5. The mitral valve is normal in structure. No evidence of mitral valve  regurgitation. No evidence of mitral stenosis.   6. The aortic valve is tricuspid. Aortic valve regurgitation is not  visualized. No aortic stenosis is present.   Comparison(s): No significant change from prior study.    12/04/2015: EST Blood pressure demonstrated a normal response to exercise. There was no ST segment deviation noted during stress. Negative, adequate stress test   Recent Labs: 03/14/2023: BUN 17; Creatinine, Ser 0.83; Hemoglobin 12.8; Magnesium 2.0; Platelets 260; Potassium 4.6; Sodium 137  No results found for requested labs within last 365 days.   CrCl cannot be calculated (Patient's most recent lab result is older than the maximum 21 days allowed.).   Wt Readings from Last 3 Encounters:  06/21/23 261 lb 12.8 oz (118.8 kg)  03/14/23 261 lb (118.4 kg)  11/15/22 261 lb (118.4 kg)     Other studies reviewed: Additional  studies/records reviewed today include: summarized above  ASSESSMENT AND PLAN:  Paroxysmal Afib CHA2DS2Vasc is 5, on Eliquis, appropriately dosed Tikosyn *** stable QTc *** No/low burden by symptoms *** Teaching re-enforced *** Labs today   Chronic CHF HFpEF *** No exam findings or symptoms of volume OL  HTN *** Looks OK *** No changes  5. Secondary hypercoagulable state  Disposition:  ***  Current medicines are reviewed at length with the patient today.  The patient did not have any concerns regarding medicines.  Norma Fredrickson, PA-C 06/28/2023 9:10 AM     [facility] [address] [place] [address] [phone] (office)  [phone] (fax)

## 2023-06-29 ENCOUNTER — Ambulatory Visit: Payer: Self-pay | Admitting: *Deleted

## 2023-06-29 ENCOUNTER — Encounter: Payer: Self-pay | Admitting: *Deleted

## 2023-06-30 ENCOUNTER — Ambulatory Visit: Attending: Physician Assistant | Admitting: Physician Assistant

## 2023-06-30 ENCOUNTER — Encounter: Payer: Self-pay | Admitting: Physician Assistant

## 2023-06-30 VITALS — BP 132/78 | HR 59 | Ht 67.0 in | Wt 258.8 lb

## 2023-06-30 DIAGNOSIS — Z5181 Encounter for therapeutic drug level monitoring: Secondary | ICD-10-CM

## 2023-06-30 DIAGNOSIS — D6869 Other thrombophilia: Secondary | ICD-10-CM

## 2023-06-30 DIAGNOSIS — I10 Essential (primary) hypertension: Secondary | ICD-10-CM | POA: Diagnosis not present

## 2023-06-30 DIAGNOSIS — Z79899 Other long term (current) drug therapy: Secondary | ICD-10-CM | POA: Diagnosis not present

## 2023-06-30 DIAGNOSIS — I480 Paroxysmal atrial fibrillation: Secondary | ICD-10-CM | POA: Diagnosis not present

## 2023-06-30 DIAGNOSIS — R001 Bradycardia, unspecified: Secondary | ICD-10-CM | POA: Diagnosis not present

## 2023-06-30 NOTE — Patient Instructions (Addendum)
 Medication Instructions:   Your physician recommends that you continue on your current medications as directed. Please refer to the Current Medication list given to you today.   *If you need a refill on your cardiac medications before your next appointment, please call your pharmacy*  Lab Work:  PLEASE GO DOWN STAIRS  [facility]  FIRST FLOOR  [address] ( GET OFF ELEVATORS MAKE A LEFT AND ANOTHER LEFT LAB ON RIGHT DOWN HALLWAY : BMET AND MAG TODAY      If you have labs (blood work) drawn today and your tests are completely normal, you will receive your results only by: MyChart Message (if you have MyChart) OR A paper copy in the mail If you have any lab test that is abnormal or we need to change your treatment, we will call you to review the results.  Testing/Procedures: NONE ORDERED  TODAY     Follow-Up: At [facility], you and your health needs are our priority.  As part of our continuing mission to provide you with exceptional heart care, our providers are all part of one team.  This team includes your primary Cardiologist (physician) and Advanced Practice Providers or APPs (Physician Assistants and Nurse Practitioners) who all work together to provide you with the care you need, when you need it.  Your next appointment:  CANCEL JUNE  APPT  WITH  RENEE   4 month(s)   Provider:    You may see  Francis Dowse, PA-C    We recommend signing up for the patient portal called "MyChart".  Sign up information is provided on this After Visit Summary.  MyChart is used to connect with patients for Virtual Visits (Telemedicine).  Patients are able to view lab/test results, encounter notes, upcoming appointments, etc.  Non-urgent messages can be sent to your provider as well.   To learn more about what you can do with MyChart, go to [email or web address].   Other Instructions     1st Floor: - Lobby - Registration  - Pharmacy  - Lab - Cafe  2nd Floor: - PV Lab -  Diagnostic Testing (echo, CT, nuclear med)  3rd Floor: - Vacant  4th Floor: - TCTS (cardiothoracic surgery) - AFib Clinic - Structural Heart Clinic - Vascular Surgery  - Vascular Ultrasound  5th Floor: - HeartCare Cardiology (general and EP) - Clinical Pharmacy for coumadin, hypertension, lipid, weight-loss medications, and med management appointments    Valet parking services will be available as well.

## 2023-07-01 LAB — BASIC METABOLIC PANEL WITH GFR
BUN/Creatinine Ratio: 23 (ref 12–28)
BUN: 16 mg/dL (ref 8–27)
CO2: 23 mmol/L (ref 20–29)
Calcium: 10.7 mg/dL — ABNORMAL HIGH (ref 8.7–10.3)
Chloride: 99 mmol/L (ref 96–106)
Creatinine, Ser: 0.7 mg/dL (ref 0.57–1.00)
Glucose: 194 mg/dL — ABNORMAL HIGH (ref 70–99)
Potassium: 4.6 mmol/L (ref 3.5–5.2)
Sodium: 136 mmol/L (ref 134–144)
eGFR: 89 mL/min/{1.73_m2} (ref >59)

## 2023-07-01 LAB — MAGNESIUM: Magnesium: 2 mg/dL (ref 1.6–2.3)

## 2023-07-05 ENCOUNTER — Ambulatory Visit: Payer: Self-pay | Admitting: *Deleted

## 2023-07-05 ENCOUNTER — Encounter: Admitting: *Deleted

## 2023-07-05 ENCOUNTER — Encounter: Payer: Self-pay | Admitting: *Deleted

## 2023-07-05 ENCOUNTER — Other Ambulatory Visit: Payer: Self-pay

## 2023-07-05 NOTE — Patient Instructions (Signed)
 Visit Information  Thank you for taking time to visit with me today. Please don't hesitate to contact me if I can be of assistance to you before our next scheduled appointment.  Our next appointment is by telephone on 07/26/23 at 1:00 Please call the care guide team at [phone] if you need to cancel or reschedule your appointment.   Following is a copy of your care plan:   Goals Addressed             This Visit's Progress    VBCI RN Care Plan   On track    Problems:  Chronic Disease Management support and education needs related to Atrial Fibrillation and DMII  Goal: Over the next 90 days the Patient will demonstrate Improved adherence to prescribed treatment plan for DMII as evidenced by a decrease in A1C. take all medications exactly as prescribed and will call provider for medication related questions as evidenced by verbalization that she is taking Lantus 20 units twice a day      Interventions:   AFIB Interventions: (Status:  Goal on track:  Yes.) Long Term Goal   Advised patient to discuss new or persistent symptoms of dizziness, headaches, or shortness of breath with provider Counseled on seeking medical attention after a head injury or if there is blood in the urine/stool Seek emergency medical attention for any severe headaches as discussed with RN Care Manager previously and with cardiology nurse practitioner   Diabetes Interventions:  (Status:  Goal on track:  Yes.) Long Term Goal Assessed patient's understanding of A1c goal: <7% Reviewed medications with patient and discussed importance of medication adherence Advised patient, providing education and rationale, to check cbg twice daily and record, calling primary care provider for findings outside established parameters Review of patient status, including review of consultants reports, relevant laboratory and other test results, and medications completed Assessed social determinant of health barriers Lab Results   Component Value Date   HGBA1C 9.3 06/10/2023    Patient Self-Care Activities:  Call provider office for new concerns or questions  Notify RN Care Manager of TOC call rescheduling needs Take medications as prescribed   manage portion size Consider water aerobics at [facility] check pulse (heart) rate once a day Notify provider of new or persistent dizziness, headaches, weakness, etc. Seek emergency medical attention if necessary.   Plan:  Telephone follow up appointment with care management team member scheduled for:  07/26/2023 at 1:00pm             Please call the [place] National Suicide Prevention Lifeline: 272-693-7054 or TTY: [phone]) [phone] TTY [phone]) to talk to a trained counselor call the [facility]: [phone] call 911 if you are experiencing a Mental Health or Behavioral Health Crisis or need someone to talk to.  Patient verbalizes understanding of instructions and care plan provided today and agrees to view in MyChart. Active MyChart status and patient understanding of how to access instructions and care plan via MyChart confirmed with patient.     Demetrios Loll, RN, BSN [facility] RN Care Manager Direct Dial: [phone]  Fax: [phone]

## 2023-07-05 NOTE — Patient Outreach (Signed)
 Complex Care Management   Visit Note  07/05/2023  Name:  Dominique Bauer MRN: [id number] DOB: [birth year]  Situation: Referral received for Complex Care Management related to Diabetes with Complications and Atrial Fibrillation I obtained verbal consent from [name].  Visit completed with [name]  on the phone  Background:   Past Medical History:  Diagnosis Date   Arthritis    osteo arthritis   Body mass index 40.0-44.9, adult (HCC)    Diabetes (HCC)    Dysrhythmia    H/O measles    H/O mumps    H/O: whooping cough    History of chicken pox    HLD (hyperlipidemia)    Hypertension    Persistent atrial fibrillation (HCC)    PONV (postoperative nausea and vomiting)    Sciatica    Sleep apnea    not using aything at night right now/LH    Assessment: Patient Reported Symptoms:  Cognitive Alert and oriented to person, place, and time, Insightful and able to interpret abstract concepts  Neurological No symptoms reported    HEENT No symptoms reported    Cardiovascular Dizziness, Lightheadness (d/t position change. Encouraged fall precautions. Pt discussed with cardiologist. Metoprolol dosage was decreased and symptoms  have improved.)    Respiratory No symptoms reported    Endocrine No symptoms reported    Gastrointestinal No symptoms reported    Genitourinary No symptoms reported    Integumentary No symptoms reported    Musculoskeletal Other Joint pain  Psychosocial No symptoms reported     Vitals:   07/05/23 1428  BP: 130/70  Pulse: 65    Medications Reviewed Today     Reviewed by Gwenith Daily, RN (Registered Nurse) on 07/05/23 at 1428  Med List Status: <None>   Medication Order Taking? Sig Documenting Provider Last Dose Status Informant  acetaminophen (TYLENOL) 500 MG tablet [id number]  Take 500-1,000 mg by mouth every 8 (eight) hours as needed for moderate pain. [provider]  Active Self  apixaban (ELIQUIS) 5 MG  TABS tablet [id number] Yes Take 5 mg by mouth 2 (two) times daily. [provider] Taking Active Self  Calcium Carbonate-Vit D-Min (CALTRATE PLUS PO) [id number] Yes Take 1 capsule by mouth in the morning, at noon, and at bedtime. [provider] Taking Active Self  cetirizine (ZYRTEC) 10 MG tablet [id number] Yes Take 10 mg by mouth daily. [provider] Taking Active Self           Med Note (ROCHELLE, REBECCA G   Tue Jun 21, 2023  2:18 PM) PRN  cholecalciferol (VITAMIN D3) 25 MCG (1000 UNIT) tablet [id number] Yes Take 1,000 Units by mouth daily. [provider] Taking Active Self  dofetilide (TIKOSYN) 125 MCG capsule [id number] Yes TAKE THREE CAPSULES BY MOUTH TWICE DAILY (LEAVE in stock bottles) Sheilah Pigeon, PA-C Taking Active   Fluticasone-Salmeterol (ADVAIR) 100-50 MCG/DOSE AEPB [id number] Yes Inhale 1 puff into the lungs daily as needed (shortness of breath). [provider] Taking Active Self  furosemide (LASIX) 40 MG tablet [id number] Yes TAKE 1 TABLET BY MOUTH EVERY OTHER DAY, AS NEEDED FOR 2  DOSES  FOR  SWELLING Keitha Butte, Ed Blalock, PA-C Taking Active   glimepiride (AMARYL) 2 MG tablet [id number] Yes Take 4 mg by mouth in the morning and at bedtime. [provider] Taking Active Self  guaiFENesin (MUCINEX) 600 MG 12 hr tablet [id number] Yes Take 600 mg by mouth daily. [provider] Taking Active Self  Med Note (ROCHELLE, REBECCA G   Tue Jun 21, 2023  2:17 PM) PRN  HYDROcodone-acetaminophen (NORCO/VICODIN) 5-325 MG tablet [id number]  Take 1 tablet by mouth every 6 (six) hours as needed for moderate pain. [provider]  Active Self  insulin glargine (LANTUS) 100 UNIT/ML injection [id number] Yes Inject 20 Units into the skin 2 (two) times daily. [provider] Taking Active   L-Lysine 500 MG TABS [id number]  Take 500 mg by mouth in the morning, at noon, and at bedtime. [provider]  Active Self   levalbuterol Pauline Aus) 0.63 MG/3ML nebulizer solution [id number] Yes Take 0.63 mg by nebulization every 6 (six) hours as needed for wheezing or shortness of breath. [provider] Taking Active Self  losartan (COZAAR) 100 MG tablet [id number] Yes Take 100 mg by mouth daily. [provider] Taking Active Self  magnesium oxide (MAG-OX) 400 MG tablet [id number] Yes TAKE 1 TABLET BY MOUTH TWICE A DAY Lennette Bihari, MD Taking Active Self  metoprolol succinate (TOPROL XL) 25 MG 24 hr tablet [id number] Yes Take 0.5 tablets (12.5 mg total) by mouth daily. Lennette Bihari, MD Taking Active   multivitamin-lutein [facility]) CAPS capsule [id number] Yes Take 1 capsule by mouth daily. [provider] Taking Active Self  Omega-3 Fatty Acids (FISH OIL) 1000 MG CAPS [id number] Yes Take 1,000 mg by mouth 3 (three) times daily.  [provider] Taking Active Self  triamcinolone cream (KENALOG) 0.1 % [id number]  Apply 1 application  topically daily as needed (rash). [provider]  Active Self            Recommendation:   PCP Follow-up Specialty provider follow-up :Cardiology  Follow Up Plan:   Telephone follow up appointment date/time:  07/26/23 1:00  Demetrios Loll, RN, BSN [place]  [facility] RN Care Manager Direct Dial: [phone]  Fax: [phone]

## 2023-07-26 ENCOUNTER — Encounter: Payer: Self-pay | Admitting: *Deleted

## 2023-07-26 ENCOUNTER — Other Ambulatory Visit: Payer: Self-pay | Admitting: *Deleted

## 2023-07-26 DIAGNOSIS — B351 Tinea unguium: Secondary | ICD-10-CM | POA: Diagnosis not present

## 2023-07-26 DIAGNOSIS — L84 Corns and callosities: Secondary | ICD-10-CM | POA: Diagnosis not present

## 2023-07-26 DIAGNOSIS — E1142 Type 2 diabetes mellitus with diabetic polyneuropathy: Secondary | ICD-10-CM | POA: Diagnosis not present

## 2023-07-28 ENCOUNTER — Telehealth: Payer: Self-pay

## 2023-07-28 NOTE — Progress Notes (Unsigned)
 Complex Care Management Note Care Guide Note  07/28/2023 Name: Dominique Bauer MRN: [id number] DOB: Oct 20, 1945   Complex Care Management Outreach Attempts: An unsuccessful outreach was attempted for an appointment today.  Follow Up Plan:  Additional outreach attempts will be made to offer the patient complex care management information and services.   Encounter Outcome:  No Answer  [facility]  [facility], [facility] Health Care Management Assistant Direct Dial: [phone]  Fax: [phone]

## 2023-08-01 NOTE — Progress Notes (Signed)
 Complex Care Management Note  Care Guide Note 08/01/2023 Name: Dominique Bauer MRN: [id number] DOB: December 05, 1945  Dominique Bauer is a 78 y.o. year old female who sees Hasanaj, Goble Last, MD for primary care. I reached out to Wynonia Hedges by phone today to offer complex care management services.  [name] was given information about Complex Care Management services today including:   The Complex Care Management services include support from the care team which includes your Nurse Care Manager, Clinical Social Worker, or Pharmacist.  The Complex Care Management team is here to help remove barriers to the health concerns and goals most important to you. Complex Care Management services are voluntary, and the patient may decline or stop services at any time by request to their care team member.   Complex Care Management Consent Status: Patient agreed to services and verbal consent obtained.   Follow up plan:  Telephone appointment with complex care management team member scheduled for:  08/04/23 at 2:00 p.m.   Encounter Outcome:  Patient Scheduled  [facility]  [facility], [facility] Health Care Management Assistant Direct Dial: [phone]  Fax: [phone]

## 2023-08-04 ENCOUNTER — Other Ambulatory Visit: Payer: Self-pay

## 2023-08-04 ENCOUNTER — Encounter: Payer: Self-pay | Admitting: *Deleted

## 2023-08-04 ENCOUNTER — Other Ambulatory Visit: Payer: Self-pay | Admitting: *Deleted

## 2023-08-04 DIAGNOSIS — E119 Type 2 diabetes mellitus without complications: Secondary | ICD-10-CM | POA: Insufficient documentation

## 2023-08-04 NOTE — Patient Instructions (Signed)
 Visit Information  Thank you for taking time to visit with me today. Please don't hesitate to contact me if I can be of assistance to you before our next scheduled appointment.  Your next care management appointment is by telephone on 09/06/23 at 2:00   Please call the care guide team at [phone] if you need to cancel, schedule, or reschedule an appointment.   Please call the [place]  National Suicide Prevention Lifeline: (910) 867-7444 or TTY: 734-727-6620 TTY [phone]) to talk to a trained counselor call the [facility]: [phone] call 911 if you are experiencing a Mental Health or Behavioral Health Crisis or need someone to talk to.  Michele Ahle, RN, BSN [place]  [facility] RN Care Manager Direct Dial: [phone]  Fax: [phone]

## 2023-08-04 NOTE — Patient Outreach (Signed)
 Complex Care Management   Visit Note  08/04/2023  Name:  Dominique Bauer MRN: [id number] DOB: [birth year]  Situation: Referral received for Complex Care Management related to Diabetes with Complications and Atrial Fibrillation I obtained verbal consent from Patient.  Visit completed with Durel Gilbert Martinon the phone  Background:   Past Medical History:  Diagnosis Date   Arthritis    osteo arthritis   Body mass index 40.0-44.9, adult (HCC)    Diabetes (HCC)    Dysrhythmia    H/O measles    H/O mumps    H/O: whooping cough    History of chicken pox    HLD (hyperlipidemia)    Hypertension    Persistent atrial fibrillation (HCC)    PONV (postoperative nausea and vomiting)    Sciatica    Sleep apnea    not using aything at night right now/LH    Assessment: Patient Reported Symptoms:  Cognitive Cognitive Status: Alert and oriented to person, place, and time, Normal speech and language skills Cognitive/Intellectual Conditions Management [RPT]: None reported or documented in medical history or problem list   Health Maintenance Behaviors: Annual physical exam, Healthy diet, Sleep adequate, Immunizations Healing Pattern: Unsure Health Facilitated by: Healthy diet, Rest  Neurological Neurological Review of Symptoms: Headaches (due to head congestion) Neurological Self-Management Outcome: 4 (good)  HEENT HEENT Symptoms Reported: Runny nose, Nasal discharge, Sore throat HEENT Conditions:  (currently has URI with sinus symptoms. Treated by PCP with ZPak, prednisone 10mg , and promethezine for cough) HEENT Management Strategies: Adequate rest, Medication therapy HEENT Self-Management Outcome: 4 (good) HEENT Comment: currently has URI with sinus symptoms. Treated by PCP with ZPak, prednisone 10mg , and promethezine for cough  (currently has URI with sinus symptoms. Treated by PCP with ZPak, prednisone 10mg , and promethezine for cough)  Cardiovascular Cardiovascular Symptoms  Reported: No symptoms reported Does patient have uncontrolled Hypertension?: No Cardiovascular Conditions: Hypertension, Dysrhythmia Cardiovascular Management Strategies: Medication therapy, Diet modification, Routine screening Cardiovascular Self-Management Outcome: 4 (good)  Respiratory Respiratory Symptoms Reported: Productive cough, Shortness of breath (thin white/ thick yellow) Additional Respiratory Details: Being treated for URI by PCP through telephone visit Respiratory Self-Management Outcome: 4 (good)  Endocrine Patient reports the following symptoms related to hypoglycemia or hyperglycemia : No symptoms reported Is patient diabetic?: Yes Is patient checking blood sugars at home?: Yes Endocrine Conditions: Diabetes Endocrine Management Strategies: Medication therapy, Routine screening, Diet modification Endocrine Self-Management Outcome: 4 (good)  Gastrointestinal Gastrointestinal Symptoms Reported: No symptoms reported      Genitourinary Genitourinary Symptoms Reported: No symptoms reported    Integumentary Integumentary Symptoms Reported: No symptoms reported    Musculoskeletal Musculoskelatal Symptoms Reviewed: Other, Muscle pain Other Musculoskeletal Symptoms: multiple joint pain Musculoskeletal Conditions: Joint pain, Osteoarthritis, Mobility limited, Back pain, Other Other Musculoskeletal Conditions: Degenerative Disc Disease Musculoskeletal Management Strategies: Medication therapy, Adequate rest Musculoskeletal Self-Management Outcome: 3 (uncertain) Falls in the past year?: No Number of falls in past year: 1 or less Was there an injury with Fall?: No Fall Risk Category Calculator: 0 Patient Fall Risk Level: Low Fall Risk Patient at Risk for Falls Due to: Impaired balance/gait Fall risk Follow up: Falls evaluation completed, Falls prevention discussed  Psychosocial Psychosocial Symptoms Reported: No symptoms reported   Major Change/Loss/Stressor/Fears (CP):  Denies Quality of Family Relationships: helpful, involved, supportive Do you feel physically threatened by others?: No      08/04/2023    2:45 PM  Depression screen PHQ 2/9  Decreased Interest 0  Down, Depressed, Hopeless 0  PHQ -  2 Score 0    Vitals:   08/04/23 1433  Pulse: 64    Medications Reviewed Today     Reviewed by Ethelene Herald, RN (Registered Nurse) on 08/04/23 at 1501  Med List Status: <None>   Medication Order Taking? Sig Documenting Provider Last Dose Status Informant  acetaminophen  (TYLENOL ) 500 MG tablet [id number] Yes Take 500-1,000 mg by mouth every 8 (eight) hours as needed for moderate pain. [provider] Taking Active Self  apixaban  (ELIQUIS ) 5 MG TABS tablet [id number] Yes Take 5 mg by mouth 2 (two) times daily. [provider] Taking Active Self  azithromycin (ZITHROMAX) 250 MG tablet [id number] Yes Take 250 mg by mouth daily. [provider] Taking Active   Calcium Carbonate-Vit D-Min (CALTRATE PLUS PO) [id number] Yes Take 1 capsule by mouth in the morning, at noon, and at bedtime. [provider] Taking Active Self  cetirizine (ZYRTEC) 10 MG tablet [id number] Yes Take 10 mg by mouth daily. [provider] Taking Active Self           Med Note (ROCHELLE, REBECCA G   Tue Jun 21, 2023  2:18 PM) PRN  cholecalciferol (VITAMIN D3) 25 MCG (1000 UNIT) tablet [id number] Yes Take 1,000 Units by mouth daily. [provider] Taking Active Self  dofetilide  (TIKOSYN ) 125 MCG capsule [id number] Yes TAKE THREE CAPSULES BY MOUTH TWICE DAILY (LEAVE in stock bottles) Debbie Fails, PA-C Taking Active   Fluticasone-Salmeterol (ADVAIR) 100-50 MCG/DOSE AEPB [id number] Yes Inhale 1 puff into the lungs daily as needed (shortness of breath). [provider] Taking Active Self  furosemide  (LASIX ) 40 MG tablet [id number] Yes TAKE 1 TABLET BY MOUTH EVERY OTHER DAY, AS NEEDED FOR 2  DOSES  FOR  SWELLING Caralyn Chandler, Frazier Jacob, PA-C  Taking Active   glimepiride (AMARYL) 2 MG tablet [id number] Yes Take 4 mg by mouth in the morning and at bedtime. [provider] Taking Active Self  guaiFENesin (MUCINEX) 600 MG 12 hr tablet [id number] Yes Take 600 mg by mouth daily. [provider] Taking Active Self           Med Note Michel Agreste, REBECCA G   Tue Jun 21, 2023  2:17 PM) PRN  HYDROcodone-acetaminophen  (NORCO/VICODIN) 5-325 MG tablet [id number] Yes Take 1 tablet by mouth every 6 (six) hours as needed for moderate pain. [provider] Taking Active Self  insulin glargine (LANTUS) 100 UNIT/ML injection [id number] Yes Inject 20 Units into the skin 2 (two) times daily. [provider] Taking Active   L-Lysine 500 MG TABS [id number] Yes Take 500 mg by mouth in the morning, at noon, and at bedtime. [provider] Taking Active Self  levalbuterol (XOPENEX) 0.63 MG/3ML nebulizer solution [id number] Yes Take 0.63 mg by nebulization every 6 (six) hours as needed for wheezing or shortness of breath. [provider] Taking Active Self  losartan (COZAAR) 100 MG tablet [id number] Yes Take 100 mg by mouth daily. [provider] Taking Active Self  magnesium  oxide (MAG-OX) 400 MG tablet [id number] Yes TAKE 1 TABLET BY MOUTH TWICE A DAY Millicent Ally, MD Taking Active Self  metoprolol  succinate (TOPROL  XL) 25 MG 24 hr tablet [id number] Yes Take 0.5 tablets (12.5 mg total) by mouth daily. Millicent Ally, MD Taking Active   multivitamin-lutein  (OCUVITE-LUTEIN ) CAPS capsule [id number] Yes Take 1 capsule by mouth daily. [provider] Taking Active Self  Omega-3 Fatty Acids (FISH OIL ) 1000 MG CAPS [id number] Yes Take 1,000 mg by mouth 3 (three) times  daily.  [provider] Taking Active Self  predniSONE (DELTASONE) 10 MG tablet [id number] Yes Take 10 mg by mouth daily. [provider] Taking Active   promethazine-dextromethorphan (PROMETHAZINE-DM) 6.25-15 MG/5ML syrup  [id number] No Take 5 mLs by mouth every 6 (six) hours as needed.  Patient not taking: Reported on 08/04/2023   [provider] Not Taking Active   triamcinolone  cream (KENALOG ) 0.1 % [id number] Yes Apply 1 application  topically daily as needed (rash). [provider] Taking Active Self            Recommendation:   Acute PCP follow-up for any new, worsening, or persistent URI or sinusitis symptoms. Seek emergency medical attention if needed  Follow Up Plan:   Telephone follow up appointment date/time:  09/06/23 at 2:00  Michele Ahle, RN, BSN [place]  [facility] RN Care Manager Direct Dial: [phone]  Fax: [phone]

## 2023-08-17 ENCOUNTER — Telehealth: Payer: Self-pay | Admitting: Cardiovascular Disease

## 2023-08-17 NOTE — Telephone Encounter (Signed)
 [facility] called to inform us  they will be sending a form for Dr. Loetta Ringer to fill out. This form will be in regard to the patient's sleep supplies.

## 2023-09-06 ENCOUNTER — Encounter: Payer: Self-pay | Admitting: *Deleted

## 2023-09-06 ENCOUNTER — Other Ambulatory Visit: Payer: Self-pay | Admitting: *Deleted

## 2023-09-07 DIAGNOSIS — J301 Allergic rhinitis due to pollen: Secondary | ICD-10-CM | POA: Diagnosis not present

## 2023-09-07 DIAGNOSIS — E7849 Other hyperlipidemia: Secondary | ICD-10-CM | POA: Diagnosis not present

## 2023-09-07 DIAGNOSIS — N182 Chronic kidney disease, stage 2 (mild): Secondary | ICD-10-CM | POA: Diagnosis not present

## 2023-09-07 DIAGNOSIS — Z0000 Encounter for general adult medical examination without abnormal findings: Secondary | ICD-10-CM | POA: Diagnosis not present

## 2023-09-07 DIAGNOSIS — E1122 Type 2 diabetes mellitus with diabetic chronic kidney disease: Secondary | ICD-10-CM | POA: Diagnosis not present

## 2023-09-07 DIAGNOSIS — R001 Bradycardia, unspecified: Secondary | ICD-10-CM | POA: Diagnosis not present

## 2023-09-07 DIAGNOSIS — I10 Essential (primary) hypertension: Secondary | ICD-10-CM | POA: Diagnosis not present

## 2023-09-07 DIAGNOSIS — M5459 Other low back pain: Secondary | ICD-10-CM | POA: Diagnosis not present

## 2023-09-07 NOTE — Patient Instructions (Signed)
 Visit Information  Thank you for taking time to visit with me today. Please don't hesitate to contact me if I can be of assistance to you before our next scheduled appointment.  Your next care management appointment is by telephone on 09/21/23 at 12:00  Please call the care guide team at [phone] if you need to cancel, schedule, or reschedule an appointment.   Please call the [facility]: [phone] call 911 if you are experiencing a Mental Health or Behavioral Health Crisis or need someone to talk to.  Michele Ahle, RN, BSN [place]  [facility] RN Care Manager Direct Dial: [phone]  Fax: [phone]

## 2023-09-07 NOTE — Patient Outreach (Signed)
 Complex Care Management   Visit Note  09/06/2023  Name:  Dominique Bauer MRN: [id number] DOB: 06-Apr-1945  Situation: Referral received for Complex Care Management related to Atrial Fibrillation and diabetes. I obtained verbal consent from Patient.  Visit completed with Wynonia Hedges on the phone  Background:   Past Medical History:  Diagnosis Date   Arthritis    osteo arthritis   Body mass index 40.0-44.9, adult (HCC)    Diabetes (HCC)    Dysrhythmia    H/O measles    H/O mumps    H/O: whooping cough    History of chicken pox    HLD (hyperlipidemia)    Hypertension    Persistent atrial fibrillation (HCC)    PONV (postoperative nausea and vomiting)    Sciatica    Sleep apnea    not using aything at night right now/LH    Assessment: Patient Reported Symptoms:  Cognitive Cognitive Status: Alert and oriented to person, place, and time, Normal speech and language skills Cognitive/Intellectual Conditions Management [RPT]: None reported or documented in medical history or problem list   Health Maintenance Behaviors: Annual physical exam, Healthy diet, Sleep adequate, Immunizations Healing Pattern: Unsure Health Facilitated by: Healthy diet, Rest  Neurological Neurological Review of Symptoms: Headaches (due to head congestion)    HEENT HEENT Symptoms Reported: Nasal discharge, Other: Other HEENT Symptoms/Conditions: post nasal drainage. Change in taste and smell. Thick yellow/white nasal discharge. HEENT Conditions:  (None) HEENT Management Strategies: Medication therapy HEENT Self-Management Outcome: 3 (uncertain) HEENT Comment: Diagnosed with covid and given paxlovid, which she completed. Continued to have sinusitis symptoms and was given doxycycline, which she has completed, but she continues to have symptoms. Has had 2 rounds of prednisone as well. Following up with PCP tomorrow.  (None)  Cardiovascular Cardiovascular Symptoms Reported: No symptoms  reported Does patient have uncontrolled Hypertension?: No Cardiovascular Conditions: Dysrhythmia, Hypertension Cardiovascular Management Strategies: Medication therapy, Routine screening, Diet modification Cardiovascular Self-Management Outcome: 4 (good)  Respiratory Respiratory Symptoms Reported: No symptoms reported Additional Respiratory Details: chest congestion and cough has improved. Sinus symptoms persist. See HEENT Assessment. Respiratory Conditions: Shortness of breath Respiratory Self-Management Outcome: 4 (good) Respiratory Comment: appointment with PCP schduled for 09/07/23  Endocrine Patient reports the following symptoms related to hypoglycemia or hyperglycemia : No symptoms reported Is patient diabetic?: Yes Is patient checking blood sugars at home?: Yes Endocrine Conditions: Diabetes Endocrine Management Strategies: Medication therapy, Routine screening, Diet modification Endocrine Self-Management Outcome: 4 (good) Endocrine Comment: need to check blood sugar more frequently while sick. Illness and recent prednsione use can increase blood sugar.  Gastrointestinal Gastrointestinal Symptoms Reported: No symptoms reported      Genitourinary Genitourinary Symptoms Reported: No symptoms reported    Integumentary Integumentary Symptoms Reported: No symptoms reported    Musculoskeletal Musculoskelatal Symptoms Reviewed: Not assessed        Psychosocial Psychosocial Symptoms Reported: No symptoms reported            08/04/2023    2:45 PM  Depression screen PHQ 2/9  Decreased Interest 0  Down, Depressed, Hopeless 0  PHQ - 2 Score 0    There were no vitals filed for this visit.  Medications Reviewed Today     Reviewed by Ethelene Herald, RN (Registered Nurse) on 09/06/23 at 1432  Med List Status: <None>   Medication Order Taking? Sig Documenting Provider Last Dose Status Informant  acetaminophen  (TYLENOL ) 500 MG tablet [id number] Yes Take 500-1,000 mg by mouth every  8 (eight) hours  as needed for moderate pain. [provider] Taking Active Self  apixaban  (ELIQUIS ) 5 MG TABS tablet [id number] Yes Take 5 mg by mouth 2 (two) times daily. [provider] Taking Active Self  azithromycin (ZITHROMAX) 250 MG tablet [id number] No Take 250 mg by mouth daily.  Patient not taking: Reported on 09/06/2023   [provider] Not Taking Active   Calcium Carbonate-Vit D-Min (CALTRATE PLUS PO) [id number] Yes Take 1 capsule by mouth in the morning, at noon, and at bedtime. [provider] Taking Active Self  cetirizine (ZYRTEC) 10 MG tablet [id number] Yes Take 10 mg by mouth daily. [provider] Taking Active Self           Med Note (ROCHELLE, REBECCA G   Tue Jun 21, 2023  2:18 PM) PRN  cholecalciferol (VITAMIN D3) 25 MCG (1000 UNIT) tablet [id number] Yes Take 1,000 Units by mouth daily. [provider] Taking Active Self  dofetilide  (TIKOSYN ) 125 MCG capsule [id number] Yes TAKE THREE CAPSULES BY MOUTH TWICE DAILY (LEAVE in stock bottles) Debbie Fails, PA-C Taking Active   Fluticasone-Salmeterol (ADVAIR) 100-50 MCG/DOSE AEPB [id number] Yes Inhale 1 puff into the lungs daily as needed (shortness of breath). [provider] Taking Active Self  furosemide  (LASIX ) 40 MG tablet [id number] Yes TAKE 1 TABLET BY MOUTH EVERY OTHER DAY, AS NEEDED FOR 2  DOSES  FOR  SWELLING Caralyn Chandler, Frazier Jacob, PA-C Taking Active   glimepiride (AMARYL) 2 MG tablet [id number] Yes Take 4 mg by mouth in the morning and at bedtime. [provider] Taking Active Self  guaiFENesin (MUCINEX) 600 MG 12 hr tablet [id number] Yes Take 600 mg by mouth daily. [provider] Taking Active Self           Med Note Michel Agreste, REBECCA G   Tue Jun 21, 2023  2:17 PM) PRN  HYDROcodone-acetaminophen  (NORCO/VICODIN) 5-325 MG tablet [id number] No Take 1 tablet by mouth every 6 (six) hours as needed for moderate pain.  Patient not taking: Reported on  09/06/2023   [provider] Not Taking Active Self  insulin glargine (LANTUS) 100 UNIT/ML injection [id number] Yes Inject 20 Units into the skin 2 (two) times daily. [provider] Taking Active   L-Lysine 500 MG TABS [id number] Yes Take 500 mg by mouth in the morning, at noon, and at bedtime. [provider] Taking Active Self  levalbuterol (XOPENEX) 0.63 MG/3ML nebulizer solution [id number] Yes Take 0.63 mg by nebulization every 6 (six) hours as needed for wheezing or shortness of breath. [provider] Taking Active Self  losartan (COZAAR) 100 MG tablet [id number] Yes Take 100 mg by mouth daily. [provider] Taking Active Self  magnesium  oxide (MAG-OX) 400 MG tablet [id number] Yes TAKE 1 TABLET BY MOUTH TWICE A DAY Millicent Ally, MD Taking Active Self  metoprolol  succinate (TOPROL  XL) 25 MG 24 hr tablet [id number] Yes Take 0.5 tablets (12.5 mg total) by mouth daily. Millicent Ally, MD Taking Active   multivitamin-lutein  (OCUVITE-LUTEIN ) CAPS capsule [id number] Yes Take 1 capsule by mouth daily. [provider] Taking Active Self  Omega-3 Fatty Acids (FISH OIL ) 1000 MG CAPS [id number] Yes Take 1,000 mg by mouth 3 (three) times daily.  [provider] Taking Active Self  predniSONE (DELTASONE) 10 MG tablet [id number] No Take 10 mg by mouth daily.  Patient not taking: Reported on 09/06/2023   [provider] Not Taking Active   promethazine-dextromethorphan (PROMETHAZINE-DM) 6.25-15 MG/5ML syrup [id number] No Take 5 mLs by  mouth every 6 (six) hours as needed.  Patient not taking: Reported on 09/06/2023   [provider] Not Taking Active   triamcinolone  cream (KENALOG ) 0.1 % [id number] Yes Apply 1 application  topically daily as needed (rash). [provider] Taking Active Self            Recommendation:   Acute PCP follow-up persistent sinusitis symptoms.  Follow Up Plan:   Telephone follow up appointment  date/time:  09/21/23 at 12:00  Michele Ahle, RN, BSN [place]  [facility] RN Care Manager Direct Dial: [phone]  Fax: [phone]

## 2023-09-13 ENCOUNTER — Ambulatory Visit: Payer: Medicare Other | Admitting: Physician Assistant

## 2023-09-19 NOTE — Telephone Encounter (Signed)
 Order for PAP supplies has been signed and faxed back [facility] 6/21

## 2023-09-21 ENCOUNTER — Encounter: Payer: Self-pay | Admitting: *Deleted

## 2023-09-21 ENCOUNTER — Other Ambulatory Visit: Payer: Self-pay | Admitting: *Deleted

## 2023-09-21 NOTE — Patient Outreach (Unsigned)
 Complex Care Management   Visit Note  09/21/2023  Name:  Dominique Bauer MRN: [id number] DOB: 03-16-1946  Situation: Referral received for Complex Care Management related to {Criteria:32550} I obtained verbal consent from {CHL AMB Patient/Caregiver:28184}.  Visit completed with ***  {VISIT LOCATION:32553}  Background:   Past Medical History:  Diagnosis Date   Arthritis    osteo arthritis   Body mass index 40.0-44.9, adult (HCC)    Diabetes (HCC)    Dysrhythmia    H/O measles    H/O mumps    H/O: whooping cough    History of chicken pox    HLD (hyperlipidemia)    Hypertension    Persistent atrial fibrillation (HCC)    PONV (postoperative nausea and vomiting)    Sciatica    Sleep apnea    not using aything at night right now/LH    Assessment: Patient Reported Symptoms:  Cognitive        Neurological      HEENT        Cardiovascular      Respiratory      Endocrine      Gastrointestinal        Genitourinary      Integumentary      Musculoskeletal          Psychosocial              08/04/2023    2:45 PM  Depression screen PHQ 2/9  Decreased Interest 0  Down, Depressed, Hopeless 0  PHQ - 2 Score 0    There were no vitals filed for this visit.  Medications Reviewed Today     Reviewed by Charlsie Josette SAILOR, RN (Registered Nurse) on 09/21/23 at 1219  Med List Status: <None>   Medication Order Taking? Sig Documenting Provider Last Dose Status Informant  acetaminophen  (TYLENOL ) 500 MG tablet [id number] No Take 500-1,000 mg by mouth every 8 (eight) hours as needed for moderate pain. [provider] Taking Active Self  apixaban  (ELIQUIS ) 5 MG TABS tablet [id number] No Take 5 mg by mouth 2 (two) times daily. [provider] Taking Active Self  azithromycin (ZITHROMAX) 250 MG tablet [id number] No Take 250 mg by mouth daily.  Patient not taking: Reported on 09/06/2023   [provider] Not Taking Active   Calcium Carbonate-Vit  D-Min (CALTRATE PLUS PO) [id number] No Take 1 capsule by mouth in the morning, at noon, and at bedtime. [provider] Taking Active Self  cetirizine (ZYRTEC) 10 MG tablet [id number] No Take 10 mg by mouth daily. [provider] Taking Active Self           Med Note (ROCHELLE, REBECCA G   Tue Jun 21, 2023  2:18 PM) PRN  cholecalciferol (VITAMIN D3) 25 MCG (1000 UNIT) tablet [id number] No Take 1,000 Units by mouth daily. [provider] Taking Active Self  dofetilide  (TIKOSYN ) 125 MCG capsule [id number] No TAKE THREE CAPSULES BY MOUTH TWICE DAILY (LEAVE in stock bottles) Leverne Charlies Helling, PA-C Taking Active   Fluticasone-Salmeterol (ADVAIR) 100-50 MCG/DOSE AEPB [id number] No Inhale 1 puff into the lungs daily as needed (shortness of breath). [provider] Taking Active Self  furosemide  (LASIX ) 40 MG tablet [id number] No TAKE 1 TABLET BY MOUTH EVERY OTHER DAY, AS NEEDED FOR 2  DOSES  FOR  SWELLING Leverne, Charlies Helling, PA-C Taking Active   glimepiride (AMARYL) 2 MG tablet [id number] No Take 4 mg by mouth in the morning and at bedtime. [provider] Taking  Active Self  guaiFENesin (MUCINEX) 600 MG 12 hr tablet [id number] No Take 600 mg by mouth daily. [provider] Taking Active Self           Med Note MAUDE, REBECCA G   Tue Jun 21, 2023  2:17 PM) PRN  HYDROcodone-acetaminophen  (NORCO/VICODIN) 5-325 MG tablet [id number] No Take 1 tablet by mouth every 6 (six) hours as needed for moderate pain.  Patient not taking: Reported on 09/06/2023   [provider] Not Taking Active Self  insulin glargine (LANTUS) 100 UNIT/ML injection [id number] No Inject 20 Units into the skin 2 (two) times daily. [provider] Taking Active   L-Lysine 500 MG TABS [id number] No Take 500 mg by mouth in the morning, at noon, and at bedtime. [provider] Taking Active Self  levalbuterol (XOPENEX) 0.63 MG/3ML nebulizer solution [id number] No Take 0.63  mg by nebulization every 6 (six) hours as needed for wheezing or shortness of breath. [provider] Taking Active Self  losartan (COZAAR) 100 MG tablet [id number] No Take 100 mg by mouth daily. [provider] Taking Active Self  magnesium  oxide (MAG-OX) 400 MG tablet [id number] No TAKE 1 TABLET BY MOUTH TWICE A DAY Burnard Debby LABOR, MD Taking Active Self  metoprolol  succinate (TOPROL  XL) 25 MG 24 hr tablet [id number] No Take 0.5 tablets (12.5 mg total) by mouth daily. Burnard Debby LABOR, MD Taking Active   multivitamin-lutein  (OCUVITE-LUTEIN ) CAPS capsule [id number] No Take 1 capsule by mouth daily. [provider] Taking Active Self  Omega-3 Fatty Acids (FISH OIL ) 1000 MG CAPS [id number] No Take 1,000 mg by mouth 3 (three) times daily.  [provider] Taking Active Self  predniSONE (DELTASONE) 10 MG tablet [id number] No Take 10 mg by mouth daily.  Patient not taking: Reported on 09/06/2023   [provider] Not Taking Active   promethazine-dextromethorphan (PROMETHAZINE-DM) 6.25-15 MG/5ML syrup [id number] No Take 5 mLs by mouth every 6 (six) hours as needed.  Patient not taking: Reported on 09/06/2023   [provider] Not Taking Active   triamcinolone  cream (KENALOG ) 0.1 % [id number] No Apply 1 application  topically daily as needed (rash). [provider] Taking Active Self            Recommendation:   {RECOMMENDATONS:32554}  Follow Up Plan:   {FOLLOWUP:32559}  SIG ***

## 2023-09-29 NOTE — Progress Notes (Deleted)
 Cardiology Office Note:    Date:  09/29/2023   ID:  Dominique Bauer, DOB March 01, 1946, MRN [id number]  PCP:  Orpha Yancey LABOR, MD  Cardiologist:  Dr. Maude Emmer   Electrophysiologist:  n/a  Referring MD: Orpha Yancey LABOR, MD   No chief complaint on file.   History of Present Illness:    78 y.o. with PAF. History of HTN and OSA. Failed DCC alone and on flecainide  01/07/16. Subsequently begun on Tikosyn  in hospital with successful DCC 04/29/16.  Seen by Dr Fernande 02/22/17 and beta blocker stopped due to relative bradycardia Last TTE done 08/14/20  No valve disease normal EF LA diameter 5.0 cm    CHADS2-VASc=5       Uses CPAP for OSA.  Sees Kelly  Januvia d/c 2019  due to frequent infections on it  She has some LE edema.Takes her Aldactone  and lasix  sporadically the latter less to avoid hypokalemia on Tikosyn    Primary put her on Nexlitol in July 2021 Myalgias with statins LDL 73 A1c 8 Cr .9 K 4.5   No cardiac complaints Discussed needing better control of her BS  Not needing to take lasix  much for LE edema Tends to be worse in LLE with prior DVT in that leg  Did not tolerate Ozempic Made BS higher and gave her abdominal pain Now on lantus insulin   ***   Prior CV studies that were reviewed today include:    ETT 12/04/15 Blood pressure demonstrated a normal response to exercise. There was no ST segment deviation noted during stress. Negative, adequate stress test.  TTE 08/14/20 IMPRESSIONS     1. Left ventricular ejection fraction, by estimation, is 60 to 65%. The  left ventricle has normal function. The left ventricle has no regional  wall motion abnormalities. The left ventricular internal cavity size was  mildly dilated. Left ventricular  diastolic parameters are consistent with Grade I diastolic dysfunction  (impaired relaxation).   2. Right ventricular systolic function is normal. The right ventricular  size is normal. Tricuspid regurgitation signal is inadequate for  assessing  PA pressure.   3. Left atrial size was moderately dilated.   4. Right atrial size was mildly dilated.   5. The mitral valve is normal in structure. No evidence of mitral valve  regurgitation. No evidence of mitral stenosis.   6. The aortic valve is tricuspid. Aortic valve regurgitation is not  visualized. No aortic stenosis is present.   Past Medical History:  Diagnosis Date  . Arthritis    osteo arthritis  . Body mass index 40.0-44.9, adult (HCC)   . Diabetes (HCC)   . Dysrhythmia   . H/O measles   . H/O mumps   . H/O: whooping cough   . History of chicken pox   . HLD (hyperlipidemia)   . Hypertension   . Persistent atrial fibrillation (HCC)   . PONV (postoperative nausea and vomiting)   . Sciatica   . Sleep apnea    not using aything at night right now/LH    Past Surgical History:  Procedure Laterality Date  . ABDOMINAL HYSTERECTOMY    . BREAST BIOPSY Right 02/01/2012  . BREAST EXCISIONAL BIOPSY Bilateral    benign  . CARDIOVERSION N/A 11/17/2015   Procedure: CARDIOVERSION;  Surgeon: Maude JAYSON Emmer, MD;  Location: [facility] ENDOSCOPY;  Service: Cardiovascular;  Laterality: N/A;  . CARDIOVERSION N/A 01/07/2016   Procedure: CARDIOVERSION;  Surgeon: Maude JAYSON Emmer, MD;  Location: [facility] ENDOSCOPY;  Service: Cardiovascular;  Laterality: N/A;  . CARDIOVERSION N/A 04/29/2016   Procedure: CARDIOVERSION;  Surgeon: Jerel Balding, MD;  Location: [facility] ENDOSCOPY;  Service: Cardiovascular;  Laterality: N/A;  . CATARACT EXTRACTION W/PHACO Left 07/20/2021   Procedure: CATARACT EXTRACTION PHACO AND INTRAOCULAR LENS PLACEMENT (IOC);  Surgeon: Harrie Agent, MD;  Location: [facility] ORS;  Service: Ophthalmology;  Laterality: Left;  CDE: 10.07  . CATARACT EXTRACTION W/PHACO Right 08/03/2021   Procedure: CATARACT EXTRACTION PHACO AND INTRAOCULAR LENS PLACEMENT (IOC);  Surgeon: Harrie Agent, MD;  Location: [facility] ORS;  Service: Ophthalmology;  Laterality: Right;  CDE 7.93  . COLONOSCOPY WITH PROPOFOL   N/A 09/20/2022   Procedure: COLONOSCOPY WITH PROPOFOL ;  Surgeon: Eartha Angelia Sieving, MD;  Location: [facility] ENDO SUITE;  Service: Gastroenterology;  Laterality: N/A;  9:30am;asa 3  . DG  BONE DENSITY ([facility] HX)    . INNER EAR SURGERY Left    ear drum replacement X2  . NASAL SEPTUM SURGERY    . POLYPECTOMY  09/20/2022   Procedure: POLYPECTOMY;  Surgeon: Eartha Angelia, Sieving, MD;  Location: [facility] ENDO SUITE;  Service: Gastroenterology;;  cold snare, cold biopsy    Current Medications: No outpatient medications have been marked as taking for the 10/05/23 encounter (Appointment) with Teigen Parslow C, MD.     Allergies:   Penicillins, Other, and Neosporin [neomycin-bacitracin zn-polymyx]   Social History   Socioeconomic History  . Marital status: Married    Spouse name: Not on file  . Number of children: Not on file  . Years of education: Not on file  . Highest education level: Not on file  Occupational History  . Not on file  Tobacco Use  . Smoking status: Never    Passive exposure: Past  . Smokeless tobacco: Never  Vaping Use  . Vaping status: Never Used  Substance and Sexual Activity  . Alcohol use: No  . Drug use: No  . Sexual activity: Yes  Other Topics Concern  . Not on file  Social History Narrative   Retired Engineer, civil (consulting).  Lives in [place].   Social Drivers of Corporate investment banker Strain: Low Risk  (03/16/2023)   Overall Financial Resource Strain (CARDIA)   . Difficulty of Paying Living Expenses: Not very hard  Food Insecurity: No Food Insecurity (07/05/2023)   Hunger Vital Sign   . Worried About Programme researcher, broadcasting/film/video in the Last Year: Never true   . Ran Out of Food in the Last Year: Never true  Transportation Needs: No Transportation Needs (06/08/2023)   PRAPARE - Transportation   . Lack of Transportation (Medical): No   . Lack of Transportation (Non-Medical): No  Physical Activity: Inactive (07/05/2023)   Exercise Vital Sign   . Days of Exercise per Week: 0 days   .  Minutes of Exercise per Session: 0 min  Stress: Not on file  Social Connections: Not on file     Family History:  The patient's family history includes Breast cancer in her maternal aunt and sister; Colon cancer in her maternal grandmother, paternal aunt, and paternal aunt; Hypertension in her mother.   ROS:   Please see the history of present illness.    Review of Systems  Constitutional: Positive for diaphoresis and malaise/fatigue.  Cardiovascular:  Positive for dyspnea on exertion and irregular heartbeat.  Respiratory:  Positive for snoring.   Skin:  Positive for rash.  Musculoskeletal:  Positive for back pain, joint swelling and myalgias.  Neurological:  Positive for dizziness and headaches.   All other systems reviewed  and are negative.   EKGs/Labs/Other Test Reviewed:    EKG:   05/15/20 SB rate 59 first degree QT 440  09/29/2023 SR rate 69 PR 228 msec otherwise normal   Recent Labs: 03/14/2023: Hemoglobin 12.8; Platelets 260 06/30/2023: BUN 16; Creatinine, Ser 0.70; Magnesium  2.0; Potassium 4.6; Sodium 136   Recent Lipid Panel No results found for: CHOL, TRIG, HDL, CHOLHDL, VLDL, LDLCALC, LDLDIRECT   Physical Exam:    VS:  There were no vitals taken for this visit.    Wt Readings from Last 3 Encounters:  06/30/23 258 lb 12.8 oz (117.4 kg)  06/21/23 261 lb 12.8 oz (118.8 kg)  03/14/23 261 lb (118.4 kg)    Affect appropriate Overweight white female  HEENT: normal Neck supple with no adenopathy JVP normal no bruits no thyromegaly Lungs clear with no wheezing and good diaphragmatic motion Heart:  S1/S2 no murmur, no rub, gallop or click PMI normal Abdomen: benighn, BS positve, no tenderness, no AAA no bruit.  No HSM or HJR Distal pulses intact with no bruits Plus one bilateral edema Neuro non-focal Skin warm and dry No muscular weakness   PLAN:    In order of problems listed above:  1. PAF:  Maintaining NSR on Tikosyn  labs and QT ok f/u Dr  Klein  [place] VASC 5 QT normal on ECG done 06/30/23   2. HTN - Well controlled.  Continue current medications and low sodium Dash type diet.     3. OSA - F/U with Dr Burnard regarding issues with CPAP  4. DM:  Ozempic d/c on lantus f/u primary   5. Dyspnea:  Likely from obesity and chronic bronchitis TTE done 08/14/20 EF 60-65% no valve disease Takes aldacone daily and PRN lasix  per Dr Fernande    6. HLD:  On Nexlitol labs with primary   F/U in a year   Maude Emmer

## 2023-10-03 ENCOUNTER — Other Ambulatory Visit: Payer: Self-pay | Admitting: *Deleted

## 2023-10-03 ENCOUNTER — Encounter: Payer: Self-pay | Admitting: *Deleted

## 2023-10-04 NOTE — Patient Instructions (Signed)
 Visit Information  Thank you for taking time to visit with me today. Please don't hesitate to contact me if I can be of assistance to you before our next scheduled appointment.  Your next care management appointment is by telephone on 11/02/23 at 2:00   Please call the care guide team at [phone] if you need to cancel, schedule, or reschedule an appointment.   Please call the [facility]: [phone] call [phone] if you are experiencing a Mental Health or Behavioral Health Crisis or need someone to talk to.  Josette Pellet, RN, BSN [place]  [facility] RN Care Manager Direct Dial: [phone]  Fax: [phone]

## 2023-10-04 NOTE — Patient Outreach (Signed)
 Complex Care Management   Visit Note  10/03/2023  Name:  Dominique Bauer MRN: [id number] DOB: [birth year]  Situation: Referral received for Complex Care Management related to Atrial Fibrillation and DM. Main purpose of today's call was to follow-up on post covid symptoms and gauge improvement. I obtained verbal consent from Patient.  Visit completed with Avelina Claudene Lunger on the phone  Background:   Past Medical History:  Diagnosis Date   Arthritis    osteo arthritis   Body mass index 40.0-44.9, adult (HCC)    Diabetes (HCC)    Dysrhythmia    H/O measles    H/O mumps    H/O: whooping cough    History of chicken pox    HLD (hyperlipidemia)    Hypertension    Persistent atrial fibrillation (HCC)    PONV (postoperative nausea and vomiting)    Sciatica    Sleep apnea    not using aything at night right now/LH    Assessment: Patient Reported Symptoms:  Cognitive Cognitive Status: Alert and oriented to person, place, and time, Normal speech and language skills, No symptoms reported Cognitive/Intellectual Conditions Management [RPT]: None reported or documented in medical history or problem list   Health Maintenance Behaviors: Annual physical exam Healing Pattern: Unsure Health Facilitated by: Rest  Neurological Neurological Review of Symptoms: No symptoms reported    HEENT HEENT Symptoms Reported: Nasal discharge, Other: HEENT Management Strategies: Medication therapy HEENT Self-Management Outcome: 4 (good) HEENT Comment: Patient has had ongoing sinus congestion and drainage since having covid 2 months ago. She has seen her PCP and been treated with antibiotics and steroids. She is still taking mucinex. All symptoms are slowly improving and her energy level is slowly improving. She will seek medical attention for persistent, new, or worsening symptoms.    Cardiovascular Cardiovascular Symptoms Reported: No symptoms reported    Respiratory Respiratory Symptoms  Reported: No symptoms reported    Endocrine Endocrine Symptoms Reported: No symptoms reported Is patient diabetic?: Yes Is patient checking blood sugars at home?: Yes List most recent blood sugar readings, include date and time of day: 149 yesterday morning Endocrine Self-Management Outcome: 4 (good) Endocrine Comment: encouraged to check blood sugar more frequently while sick. Provided rationale.  Gastrointestinal Gastrointestinal Symptoms Reported: No symptoms reported      Genitourinary Genitourinary Symptoms Reported: No symptoms reported    Integumentary Integumentary Symptoms Reported: No symptoms reported    Musculoskeletal Musculoskelatal Symptoms Reviewed: No symptoms reported   Falls in the past year?: No Number of falls in past year: 1 or less Was there an injury with Fall?: No Fall Risk Category Calculator: 0 Patient Fall Risk Level: Low Fall Risk    Psychosocial Psychosocial Symptoms Reported: No symptoms reported            08/04/2023    2:45 PM  Depression screen PHQ 2/9  Decreased Interest 0  Down, Depressed, Hopeless 0  PHQ - 2 Score 0    Vitals:   10/04/23 0949  BP: 130/70    Medications Reviewed Today     Reviewed by Charlsie Josette SAILOR, RN (Registered Nurse) on 10/04/23 at [phone]  Med List Status: <None>   Medication Order Taking? Sig Documenting Provider Last Dose Status Informant  acetaminophen  (TYLENOL ) 500 MG tablet [id number] Yes Take 500-1,000 mg by mouth every 8 (eight) hours as needed for moderate pain. [provider]  Active Self  apixaban  (ELIQUIS ) 5 MG TABS tablet [id number] Yes Take 5 mg by mouth 2 (two) times  daily. [provider]  Active Self  azithromycin (ZITHROMAX) 250 MG tablet [id number]  Take 250 mg by mouth daily.  Patient not taking: Reported on 10/04/2023   [provider]  Active   Calcium Carbonate-Vit D-Min (CALTRATE PLUS PO) [id number] Yes Take 1 capsule by mouth in the morning, at noon, and at bedtime.  [provider]  Active Self  cetirizine (ZYRTEC) 10 MG tablet [id number] Yes Take 10 mg by mouth daily. [provider]  Active Self           Med Note (ROCHELLE, REBECCA G   Tue Jun 21, 2023  2:18 PM) PRN  cholecalciferol (VITAMIN D3) 25 MCG (1000 UNIT) tablet [id number] Yes Take 1,000 Units by mouth daily. [provider]  Active Self  dofetilide  (TIKOSYN ) 125 MCG capsule [id number] Yes TAKE THREE CAPSULES BY MOUTH TWICE DAILY (LEAVE in stock bottles) Leverne Charlies Helling, PA-C  Active   Fluticasone-Salmeterol (ADVAIR) 100-50 MCG/DOSE AEPB [id number] Yes Inhale 1 puff into the lungs daily as needed (shortness of breath). [provider]  Active Self  furosemide  (LASIX ) 40 MG tablet [id number] Yes TAKE 1 TABLET BY MOUTH EVERY OTHER DAY, AS NEEDED FOR 2  DOSES  FOR  SWELLING Ursuy, Renee Lynn, PA-C  Active   glimepiride (AMARYL) 2 MG tablet [id number] Yes Take 4 mg by mouth in the morning and at bedtime. [provider]  Active Self  guaiFENesin (MUCINEX) 600 MG 12 hr tablet [id number] Yes Take 600 mg by mouth daily. [provider]  Active Self           Med Note MAUDE, REBECCA G   Tue Jun 21, 2023  2:17 PM) PRN  HYDROcodone-acetaminophen  (NORCO/VICODIN) 5-325 MG tablet [id number]  Take 1 tablet by mouth every 6 (six) hours as needed for moderate pain.  Patient not taking: Reported on 10/04/2023   [provider]  Active Self  insulin glargine (LANTUS) 100 UNIT/ML injection [id number] Yes Inject 20 Units into the skin 2 (two) times daily. [provider]  Active   L-Lysine 500 MG TABS [id number] Yes Take 500 mg by mouth in the morning, at noon, and at bedtime. [provider]  Active Self  levalbuterol (XOPENEX) 0.63 MG/3ML nebulizer solution [id number] Yes Take 0.63 mg by nebulization every 6 (six) hours as needed for wheezing or shortness of breath. [provider]  Active Self  losartan (COZAAR) 100 MG tablet  [id number] Yes Take 100 mg by mouth daily. [provider]  Active Self  magnesium  oxide (MAG-OX) 400 MG tablet [id number] Yes TAKE 1 TABLET BY MOUTH TWICE A DAY Burnard Debby LABOR, MD  Active Self  metoprolol  succinate (TOPROL  XL) 25 MG 24 hr tablet [id number] Yes Take 0.5 tablets (12.5 mg total) by mouth daily. Burnard Debby LABOR, MD  Active   multivitamin-lutein  (OCUVITE-LUTEIN ) CAPS capsule [id number] Yes Take 1 capsule by mouth daily. [provider]  Active Self  Omega-3 Fatty Acids (FISH OIL ) 1000 MG CAPS [id number] Yes Take 1,000 mg by mouth 3 (three) times daily.  [provider]  Active Self  predniSONE (DELTASONE) 10 MG tablet [id number]  Take 10 mg by mouth daily.  Patient not taking: Reported on 10/04/2023   [provider]  Active   promethazine-dextromethorphan (PROMETHAZINE-DM) 6.25-15 MG/5ML syrup [id number]  Take 5 mLs by mouth every 6 (six) hours as needed.  Patient not taking: Reported on 10/04/2023   [provider]  Active   triamcinolone  cream (KENALOG ) 0.1 % [id number] Yes  Apply 1 application  topically daily as needed (rash). [provider]  Active Self            Recommendation:   Continue Current Plan of Care Seek medical attention for persistent, new, or worsening symptoms  Follow Up Plan:   Telephone follow up appointment date/time:  11/02/23 at 2:00  Josette Pellet, RN, BSN [place]  [facility] RN Care Manager Direct Dial: [phone]  Fax: [phone]

## 2023-10-05 ENCOUNTER — Ambulatory Visit: Admitting: Cardiovascular Disease

## 2023-10-20 DIAGNOSIS — B351 Tinea unguium: Secondary | ICD-10-CM | POA: Diagnosis not present

## 2023-10-20 DIAGNOSIS — M79675 Pain in left toe(s): Secondary | ICD-10-CM | POA: Diagnosis not present

## 2023-10-20 DIAGNOSIS — M79674 Pain in right toe(s): Secondary | ICD-10-CM | POA: Diagnosis not present

## 2023-10-31 NOTE — Progress Notes (Unsigned)
 Cardiology Office Note Date:  10/31/2023  Patient ID:  Dominique, Bauer Jul 23, 1945, MRN [id number] PCP:  Orpha Yancey LABOR, MD  Cardiologist:  Dr. Delford Electrophysiologist: Dr. Fernande   Chief Complaint:   ***  tikosyn  visit  History of Present Illness: Dominique Bauer is a 78 y.o. female with history of  AFib, HTN,  sleep apnea w/CPAP,  HFpEF, chronic CHF,  very remote DVT (post calcaneal fracture/casting)  I saw her Feb 2023 She tells me she does not feel great, but not new, and feels is 2/2 her weight and DM. She denies any CP, palpitations or cardiac awareness She does not think she has had Afib since on tikosyn . She has DOE with heavier activities, increased pace, stairs, this is her baseline for years, unchanged Uses her spironolactone  PRN, infrequently, lasix  also PRN, uses now and then No near syncope or syncope No bleeding No changes were made  She saw Dr. Fernande 03/09/22, c/o fatigue and BB was stopped started on diltiazem    Follows with Dr. Court for OSA Following with Dr. Delford as well and myself  Had a visit with Dr. Burnard for OSA visit, started Toprol  12.5mg  daily to follow up with me and revisit her BP, HR  I saw her 06/30/23 Stable QTc No changes were made  TODAY  *** tikosyn  EKG, meds, labs, teaching *** symptoms *** eliquis , dose, bleeding *** volume *** needs a new EP MD > [facility]?   AAD Hx Diagnosed 2017 Flecainide  failed to maintain SR, 2017 Tikosyn  (started 2018)  Past Medical History:  Diagnosis Date   Arthritis    osteo arthritis   Body mass index 40.0-44.9, adult (HCC)    Diabetes (HCC)    Dysrhythmia    H/O measles    H/O mumps    H/O: whooping cough    History of chicken pox    HLD (hyperlipidemia)    Hypertension    Persistent atrial fibrillation (HCC)    PONV (postoperative nausea and vomiting)    Sciatica    Sleep apnea    not using aything at night right now/LH    Past Surgical History:  Procedure  Laterality Date   ABDOMINAL HYSTERECTOMY     BREAST BIOPSY Right 02/01/2012   BREAST EXCISIONAL BIOPSY Bilateral    benign   CARDIOVERSION N/A 11/17/2015   Procedure: CARDIOVERSION;  Surgeon: Maude JAYSON Delford, MD;  Location: [facility] ENDOSCOPY;  Service: Cardiovascular;  Laterality: N/A;   CARDIOVERSION N/A 01/07/2016   Procedure: CARDIOVERSION;  Surgeon: Maude JAYSON Delford, MD;  Location: [facility] ENDOSCOPY;  Service: Cardiovascular;  Laterality: N/A;   CARDIOVERSION N/A 04/29/2016   Procedure: CARDIOVERSION;  Surgeon: Jerel Balding, MD;  Location: [facility] ENDOSCOPY;  Service: Cardiovascular;  Laterality: N/A;   CATARACT EXTRACTION W/PHACO Left 07/20/2021   Procedure: CATARACT EXTRACTION PHACO AND INTRAOCULAR LENS PLACEMENT (IOC);  Surgeon: Harrie Agent, MD;  Location: [facility] ORS;  Service: Ophthalmology;  Laterality: Left;  CDE: 10.07   CATARACT EXTRACTION W/PHACO Right 08/03/2021   Procedure: CATARACT EXTRACTION PHACO AND INTRAOCULAR LENS PLACEMENT (IOC);  Surgeon: Harrie Agent, MD;  Location: [facility] ORS;  Service: Ophthalmology;  Laterality: Right;  CDE 7.93   COLONOSCOPY WITH PROPOFOL  N/A 09/20/2022   Procedure: COLONOSCOPY WITH PROPOFOL ;  Surgeon: Eartha Angelia Sieving, MD;  Location: [facility] ENDO SUITE;  Service: Gastroenterology;  Laterality: N/A;  9:30am;asa 3   DG  BONE DENSITY ([facility] HX)     INNER EAR SURGERY Left    ear drum replacement X2  NASAL SEPTUM SURGERY     POLYPECTOMY  09/20/2022   Procedure: POLYPECTOMY;  Surgeon: Eartha Flavors, Toribio, MD;  Location: [facility] ENDO SUITE;  Service: Gastroenterology;;  cold snare, cold biopsy    Current Outpatient Medications  Medication Sig Dispense Refill   acetaminophen  (TYLENOL ) 500 MG tablet Take 500-1,000 mg by mouth every 8 (eight) hours as needed for moderate pain.     apixaban  (ELIQUIS ) 5 MG TABS tablet Take 5 mg by mouth 2 (two) times daily.     azithromycin (ZITHROMAX) 250 MG tablet Take 250 mg by mouth daily. (Patient not taking: Reported on 10/04/2023)      Calcium Carbonate-Vit D-Min (CALTRATE PLUS PO) Take 1 capsule by mouth in the morning, at noon, and at bedtime.     cetirizine (ZYRTEC) 10 MG tablet Take 10 mg by mouth daily.     cholecalciferol (VITAMIN D3) 25 MCG (1000 UNIT) tablet Take 1,000 Units by mouth daily.     dofetilide  (TIKOSYN ) 125 MCG capsule TAKE THREE CAPSULES BY MOUTH TWICE DAILY (LEAVE in stock bottles) 540 capsule 3   Fluticasone-Salmeterol (ADVAIR) 100-50 MCG/DOSE AEPB Inhale 1 puff into the lungs daily as needed (shortness of breath).     furosemide  (LASIX ) 40 MG tablet TAKE 1 TABLET BY MOUTH EVERY OTHER DAY, AS NEEDED FOR 2  DOSES  FOR  SWELLING 90 tablet 3   glimepiride (AMARYL) 2 MG tablet Take 4 mg by mouth in the morning and at bedtime.     guaiFENesin (MUCINEX) 600 MG 12 hr tablet Take 600 mg by mouth daily.     HYDROcodone-acetaminophen  (NORCO/VICODIN) 5-325 MG tablet Take 1 tablet by mouth every 6 (six) hours as needed for moderate pain. (Patient not taking: Reported on 10/04/2023)     insulin glargine (LANTUS) 100 UNIT/ML injection Inject 20 Units into the skin 2 (two) times daily.     L-Lysine 500 MG TABS Take 500 mg by mouth in the morning, at noon, and at bedtime.     levalbuterol (XOPENEX) 0.63 MG/3ML nebulizer solution Take 0.63 mg by nebulization every 6 (six) hours as needed for wheezing or shortness of breath.     losartan (COZAAR) 100 MG tablet Take 100 mg by mouth daily.     magnesium  oxide (MAG-OX) 400 MG tablet TAKE 1 TABLET BY MOUTH TWICE A DAY 180 tablet 3   metoprolol  succinate (TOPROL  XL) 25 MG 24 hr tablet Take 0.5 tablets (12.5 mg total) by mouth daily. 45 tablet 3   multivitamin-lutein  (OCUVITE-LUTEIN ) CAPS capsule Take 1 capsule by mouth daily.     Omega-3 Fatty Acids (FISH OIL ) 1000 MG CAPS Take 1,000 mg by mouth 3 (three) times daily.      predniSONE (DELTASONE) 10 MG tablet Take 10 mg by mouth daily. (Patient not taking: Reported on 10/04/2023)     promethazine-dextromethorphan (PROMETHAZINE-DM)  6.25-15 MG/5ML syrup Take 5 mLs by mouth every 6 (six) hours as needed. (Patient not taking: Reported on 10/04/2023)     triamcinolone  cream (KENALOG ) 0.1 % Apply 1 application  topically daily as needed (rash).     No current facility-administered medications for this visit.    Allergies:   Penicillins, Other, and Neosporin [neomycin-bacitracin zn-polymyx]   Social History:  The patient  reports that she has never smoked. She has been exposed to tobacco smoke. She has never used smokeless tobacco. She reports that she does not drink alcohol and does not use drugs.   Family History:  The patient's family history includes Breast cancer  in her maternal aunt and sister; Colon cancer in her maternal grandmother, paternal aunt, and paternal aunt; Hypertension in her mother.  ROS:  Please see the history of present illness.    All other systems are reviewed and otherwise negative.   PHYSICAL EXAM:  VS:  There were no vitals taken for this visit. BMI: There is no height or weight on file to calculate BMI. Well nourished, well developed, in no acute distress HEENT: normocephalic, atraumatic Neck: no JVD, carotid bruits or masses Cardiac: ***  RRR; no significant murmurs, no rubs, or gallops Lungs: *** CTA b/l, no wheezing, rhonchi or rales Abd: soft, nontender MS: no deformity or atrophy Ext:  *** Unchanged: she has compression stocking on her LLE (chronically post DVT years ago), no edema appreciated RLE (reports some chronic swelling LLE since remote DVT) Skin: warm and dry, no rash Neuro:  No gross deficits appreciated Psych: euthymic mood, full affect   EKG:  Done today and reviewed by myself shows  ***  06/30/23: SB 59bpm, LAD, 1st degree Avblock , Qtc   08/14/2020: TTE IMPRESSIONS   1. Left ventricular ejection fraction, by estimation, is 60 to 65%. The  left ventricle has normal function. The left ventricle has no regional  wall motion abnormalities. The left ventricular  internal cavity size was  mildly dilated. Left ventricular  diastolic parameters are consistent with Grade I diastolic dysfunction  (impaired relaxation).   2. Right ventricular systolic function is normal. The right ventricular  size is normal. Tricuspid regurgitation signal is inadequate for assessing  PA pressure.   3. Left atrial size was moderately dilated.   4. Right atrial size was mildly dilated.   5. The mitral valve is normal in structure. No evidence of mitral valve  regurgitation. No evidence of mitral stenosis.   6. The aortic valve is tricuspid. Aortic valve regurgitation is not  visualized. No aortic stenosis is present.   Comparison(s): No significant change from prior study.    12/04/2015: EST Blood pressure demonstrated a normal response to exercise. There was no ST segment deviation noted during stress. Negative, adequate stress test   Recent Labs: 03/14/2023: Hemoglobin 12.8; Platelets 260 06/30/2023: BUN 16; Creatinine, Ser 0.70; Magnesium  2.0; Potassium 4.6; Sodium 136  No results found for requested labs within last 365 days.   CrCl cannot be calculated (Patient's most recent lab result is older than the maximum 21 days allowed.).   Wt Readings from Last 3 Encounters:  06/30/23 258 lb 12.8 oz (117.4 kg)  06/21/23 261 lb 12.8 oz (118.8 kg)  03/14/23 261 lb (118.4 kg)     Other studies reviewed: Additional studies/records reviewed today include: summarized above  ASSESSMENT AND PLAN:  Paroxysmal Afib CHA2DS2Vasc is 5, on Eliquis , *** appropriately dosed Tikosyn  w/***stable QTc *** No/low burden by symptoms Labs today   Chronic CHF HFpEF *** No exam findings or symptoms of volume OL C/w Dr. Vivi  HTN *** Looks OK  5. Secondary hypercoagulable state     Disposition:  ***, sooner if needed  Current medicines are reviewed at length with the patient today.  The patient did not have any concerns regarding medicines.  Bonney Charlies Arthur, PA-C 10/31/2023 9:56 AM     [facility] [address] [place] [address] [phone] (office)  [phone] (fax)

## 2023-11-01 ENCOUNTER — Ambulatory Visit: Attending: Physician Assistant | Admitting: Physician Assistant

## 2023-11-01 ENCOUNTER — Encounter: Payer: Self-pay | Admitting: Physician Assistant

## 2023-11-01 VITALS — BP 122/64 | HR 64 | Ht 68.0 in | Wt 270.0 lb

## 2023-11-01 DIAGNOSIS — I10 Essential (primary) hypertension: Secondary | ICD-10-CM

## 2023-11-01 DIAGNOSIS — Z5181 Encounter for therapeutic drug level monitoring: Secondary | ICD-10-CM

## 2023-11-01 DIAGNOSIS — I5032 Chronic diastolic (congestive) heart failure: Secondary | ICD-10-CM | POA: Diagnosis not present

## 2023-11-01 DIAGNOSIS — Z79899 Other long term (current) drug therapy: Secondary | ICD-10-CM

## 2023-11-01 DIAGNOSIS — D6869 Other thrombophilia: Secondary | ICD-10-CM | POA: Diagnosis not present

## 2023-11-01 DIAGNOSIS — I480 Paroxysmal atrial fibrillation: Secondary | ICD-10-CM | POA: Diagnosis not present

## 2023-11-01 NOTE — Patient Instructions (Signed)
 Medication Instructions:   Your physician recommends that you continue on your current medications as directed. Please refer to the Current Medication list given to you today.   *If you need a refill on your cardiac medications before your next appointment, please call your pharmacy*   Lab Work:  PLEASE GO DOWN STAIRS  [facility]  FIRST FLOOR   ( GET OFF ELEVATORS WALK TOWARDS WAITING AREA LAB LOCATED BY PHARMACY):  BMET MAG AND CBC TODAY     If you have labs (blood work) drawn today and your tests are completely normal, you will receive your results only by: MyChart Message (if you have MyChart) OR A paper copy in the mail If you have any lab test that is abnormal or we need to change your treatment, we will call you to review the results.    Testing/Procedures: NONE ORDERED  TODAY    Follow-Up: At [facility], you and your health needs are our priority.  As part of our continuing mission to provide you with exceptional heart care, our providers are all part of one team.  This team includes your primary Cardiologist (physician) and Advanced Practice Providers or APPs (Physician Assistants and Nurse Practitioners) who all work together to provide you with the care you need, when you need it.  Your next appointment:    4 month(s) ( CONTACT  CASSIE HALL/ ANGELINE HAMMER FOR EP SCHEDULING ISSUES )   Provider:   You may see Dr Almetta     We recommend signing up for the patient portal called MyChart.  Sign up information is provided on this After Visit Summary.  MyChart is used to connect with patients for Virtual Visits (Telemedicine).  Patients are able to view lab/test results, encounter notes, upcoming appointments, etc.  Non-urgent messages can be sent to your provider as well.   To learn more about what you can do with MyChart, go to [email or web address].   Other Instructions

## 2023-11-02 ENCOUNTER — Ambulatory Visit: Payer: Self-pay | Admitting: *Deleted

## 2023-11-02 ENCOUNTER — Telehealth: Admitting: *Deleted

## 2023-11-02 ENCOUNTER — Other Ambulatory Visit: Payer: Self-pay

## 2023-11-02 LAB — CBC
Hematocrit: 39.2 % (ref 34.0–46.6)
Hemoglobin: 12.9 g/dL (ref 11.1–15.9)
MCH: 29.5 pg (ref 26.6–33.0)
MCHC: 32.9 g/dL (ref 31.5–35.7)
MCV: 90 fL (ref 79–97)
Platelets: 223 x10E3/uL (ref 150–450)
RBC: 4.38 x10E6/uL (ref 3.77–5.28)
RDW: 12.7 % (ref 11.7–15.4)
WBC: 10.4 x10E3/uL (ref 3.4–10.8)

## 2023-11-02 LAB — BASIC METABOLIC PANEL WITH GFR
BUN/Creatinine Ratio: 21 (ref 12–28)
BUN: 15 mg/dL (ref 8–27)
CO2: 23 mmol/L (ref 20–29)
Calcium: 10.3 mg/dL (ref 8.7–10.3)
Chloride: 97 mmol/L (ref 96–106)
Creatinine, Ser: 0.73 mg/dL (ref 0.57–1.00)
Glucose: 170 mg/dL — ABNORMAL HIGH (ref 70–99)
Potassium: 4.4 mmol/L (ref 3.5–5.2)
Sodium: 136 mmol/L (ref 134–144)
eGFR: 84 mL/min/1.73 (ref >59)

## 2023-11-02 LAB — MAGNESIUM: Magnesium: 1.9 mg/dL (ref 1.6–2.3)

## 2023-11-02 NOTE — Patient Instructions (Signed)
 Visit Information  Thank you for taking time to visit with me today. Please don't hesitate to contact me if I can be of assistance to you before our next scheduled appointment.  Your next care management appointment is by telephone on Wednesday, September 3rd  at 2:00pm.    Please call the care guide team at [phone] if you need to cancel, schedule, or reschedule an appointment.   A reminder to ALL patients/family/friends, please call the [place]  National Suicide Prevention Lifeline: 636-785-4852 or TTY: 8572224415 TTY [phone]) to talk to a trained counselor if you are experiencing a Mental Health or Behavioral Health Crisis or need someone to talk to.  Santana Stamp BSN, CCM [place]  VBCI Population Health RN Care Manager Direct Dial: [phone]  Fax: [phone]

## 2023-11-02 NOTE — Patient Outreach (Addendum)
 Complex Care Management   Visit Note  11/02/2023  Name:  Dominique Bauer MRN: [id number] DOB: 1945-12-23  Situation: Referral received for Complex Care Management related to Atrial Fibrillation and DM.  I obtained verbal consent from Patient.  Visit completed with Dominique Bauer  on the phone. Reports sinus symptoms has improved, fatigue has improved, she is taking Mucinex and Zyrtec for sinus symptoms.  Saw Cardiology yesterday, no med changes, no acute concerns.    Background:   Past Medical History:  Diagnosis Date   Arthritis    osteo arthritis   Body mass index 40.0-44.9, adult (HCC)    Diabetes (HCC)    Dysrhythmia    H/O measles    H/O mumps    H/O: whooping cough    History of chicken pox    HLD (hyperlipidemia)    Hypertension    Persistent atrial fibrillation (HCC)    PONV (postoperative nausea and vomiting)    Sciatica    Sleep apnea    not using aything at night right now/LH    Assessment: Patient Reported Symptoms:  Cognitive Cognitive Status: Alert and oriented to person, place, and time, Normal speech and language skills, Insightful and able to interpret abstract concepts      Neurological Neurological Review of Symptoms: No symptoms reported    HEENT HEENT Symptoms Reported: Nasal discharge HEENT Comment: Reports minimal post nasal drainage, occasional cough during the day, taking Mucinex and Zyrtec.    Cardiovascular Cardiovascular Symptoms Reported: No symptoms reported    Respiratory Respiratory Symptoms Reported: No symptoms reported    Endocrine Endocrine Symptoms Reported: No symptoms reported    Gastrointestinal Gastrointestinal Symptoms Reported: No symptoms reported Additional Gastrointestinal Details: Taking a probiotic once a day, eating yogurt daily.      Genitourinary Genitourinary Symptoms Reported: No symptoms reported    Integumentary Integumentary Symptoms Reported: No symptoms reported    Musculoskeletal Musculoskelatal Symptoms  Reviewed: No symptoms reported        Psychosocial Psychosocial Symptoms Reported: No symptoms reported            08/04/2023    2:45 PM  Depression screen PHQ 2/9  Decreased Interest 0  Down, Depressed, Hopeless 0  PHQ - 2 Score 0    There were no vitals filed for this visit.  Medications Reviewed Today   Medications were not reviewed in this encounter     Recommendation:   PCP follow up in October 2025.  Specialty provider follow-up Cardiology 01/19/24 -Dominique Bauer will continue to take blood sugars as ordered, will report blood sugars that are trending above 200 2-hours post meals, educated on 'sick day' blood sugars. Will report to PCP increased sinus symptoms/cough/dizziness/drainage/fever.   Follow Up Plan:   Telephone follow-up in 1 month with Kristin Hudy, RNCM.   Santana Stamp BSN, CCM [place]  VBCI Population Health RN Care Manager Direct Dial: [phone]  Fax: [phone]

## 2023-11-17 ENCOUNTER — Other Ambulatory Visit: Payer: Self-pay | Admitting: Internal Medicine

## 2023-11-17 DIAGNOSIS — Z1231 Encounter for screening mammogram for malignant neoplasm of breast: Secondary | ICD-10-CM

## 2023-11-30 ENCOUNTER — Telehealth: Admitting: *Deleted

## 2023-12-05 ENCOUNTER — Telehealth: Payer: Self-pay | Admitting: Cardiology

## 2023-12-05 DIAGNOSIS — G4733 Obstructive sleep apnea (adult) (pediatric): Secondary | ICD-10-CM

## 2023-12-05 DIAGNOSIS — I5032 Chronic diastolic (congestive) heart failure: Secondary | ICD-10-CM

## 2023-12-05 NOTE — Telephone Encounter (Signed)
 Patient is having problems with her CPAP machine and she can't sleep or tolerate the pressure of the machine.  Patient is following up on getting updated equipment.

## 2023-12-05 NOTE — Telephone Encounter (Signed)
 Patient would like her pressure changed back from 10-16 because it is blowing too strong and is keeping her and her husband awake. She wants to go back to her original pressure of ...  New ResMed AirSense 11 AutoSet  Mask, headgear, cushions, filters, heated tubing and water  chamber  Min Pressure (cmH2O): 10.0  Max Pressure (cmH2O): 14.0  Response: Standard  EPR: Fulltime  EPR level (cmH2O): 3  Ramp enable: On  Ramp time (min): 45  Start pressure (cmH2O): 4.0

## 2023-12-07 DIAGNOSIS — N182 Chronic kidney disease, stage 2 (mild): Secondary | ICD-10-CM | POA: Diagnosis not present

## 2023-12-07 DIAGNOSIS — J301 Allergic rhinitis due to pollen: Secondary | ICD-10-CM | POA: Diagnosis not present

## 2023-12-07 DIAGNOSIS — I10 Essential (primary) hypertension: Secondary | ICD-10-CM | POA: Diagnosis not present

## 2023-12-07 DIAGNOSIS — E1122 Type 2 diabetes mellitus with diabetic chronic kidney disease: Secondary | ICD-10-CM | POA: Diagnosis not present

## 2023-12-07 DIAGNOSIS — M5459 Other low back pain: Secondary | ICD-10-CM | POA: Diagnosis not present

## 2023-12-07 DIAGNOSIS — E7849 Other hyperlipidemia: Secondary | ICD-10-CM | POA: Diagnosis not present

## 2023-12-07 DIAGNOSIS — Z0000 Encounter for general adult medical examination without abnormal findings: Secondary | ICD-10-CM | POA: Diagnosis not present

## 2023-12-07 DIAGNOSIS — R001 Bradycardia, unspecified: Secondary | ICD-10-CM | POA: Diagnosis not present

## 2023-12-07 LAB — HEMOGLOBIN A1C: Hemoglobin A1C: 8.5

## 2023-12-12 ENCOUNTER — Other Ambulatory Visit: Payer: Self-pay

## 2023-12-12 NOTE — Patient Instructions (Signed)
 Visit Information  Thank you for taking time to visit with me today. Please don't hesitate to contact me if I can be of assistance to you before our next scheduled appointment.  Your next care management appointment is by telephone on Monday, October 13th at 2:00pm   Please call the care guide team at [phone] if you need to cancel, schedule, or reschedule an appointment.   A reminder to ALL patients/family/friends, please call the [place]  National Suicide Prevention Lifeline: (301) 191-6625 or TTY: (863) 309-2103 TTY (208)122-3124) to talk to a trained counselor if you are experiencing a Mental Health or Behavioral Health Crisis or need someone to talk to.  Santana Stamp BSN, CCM [place]  VBCI Population Health RN Care Manager Direct Dial: [phone]  Fax: [phone]

## 2023-12-12 NOTE — Patient Outreach (Signed)
 Complex Care Management   Visit Note  12/12/2023  Name:  Dominique Bauer MRN: [id number] DOB: June 08, 1945  Situation: Referral received for Complex Care Management related to afib. I obtained verbal consent from Patient.  Visit completed with Dominique Bauer  on the phone. Main concern today is dealing with generalized fatigue after having COVID symptoms for three months (April, May, June).  She has spoken to PCP about how to manage fatigue.    Background:   Past Medical History:  Diagnosis Date   Arthritis    osteo arthritis   Body mass index 40.0-44.9, adult (HCC)    Diabetes (HCC)    Dysrhythmia    H/O measles    H/O mumps    H/O: whooping cough    History of chicken pox    HLD (hyperlipidemia)    Hypertension    Persistent atrial fibrillation (HCC)    PONV (postoperative nausea and vomiting)    Sciatica    Sleep apnea    not using aything at night right now/LH    Assessment: Patient Reported Symptoms:  Cognitive Cognitive Status: No symptoms reported, Alert and oriented to person, place, and time, Insightful and able to interpret abstract concepts, Normal speech and language skills      Neurological Neurological Review of Symptoms: No symptoms reported    HEENT HEENT Symptoms Reported: No symptoms reported      Cardiovascular Cardiovascular Symptoms Reported: No symptoms reported    Respiratory Respiratory Symptoms Reported: No symptoms reported Respiratory Self-Management Outcome: 4 (good)  Endocrine Endocrine Symptoms Reported: Not assessed    Gastrointestinal Additional Gastrointestinal Details: Bowel Plan, by day two, she would get more aggressive with foods that aid in BM's, such as prunes, fruits, drinks more water . Gastrointestinal Self-Management Outcome: 5 (very good)    Genitourinary Genitourinary Symptoms Reported: No symptoms reported    Integumentary Integumentary Symptoms Reported: No symptoms reported    Musculoskeletal Musculoskelatal Symptoms  Reviewed: Joint pain Additional Musculoskeletal Details: Manages with Tylenol  and  hydrocodone Musculoskeletal Self-Management Outcome: 4 (good)      Psychosocial Psychosocial Symptoms Reported: No symptoms reported Behavioral Health Self-Management Outcome: 4 (good)        12/12/2023    PHQ2-9 Depression Screening   Little interest or pleasure in doing things    Feeling down, depressed, or hopeless    PHQ-2 - Total Score    Trouble falling or staying asleep, or sleeping too much    Feeling tired or having little energy    Poor appetite or overeating     Feeling bad about yourself - or that you are a failure or have let yourself or your family down    Trouble concentrating on things, such as reading the newspaper or watching television    Moving or speaking so slowly that other people could have noticed.  Or the opposite - being so fidgety or restless that you have been moving around a lot more than usual    Thoughts that you would be better off dead, or hurting yourself in some way    PHQ2-9 Total Score    If you checked off any problems, how difficult have these problems made it for you to do your work, take care of things at home, or get along with other people    Depression Interventions/Treatment      There were no vitals filed for this visit.  Medications Reviewed Today     Reviewed by Lucian Santana LABOR, RN (Registered Nurse) on 12/12/23 at  1434  Med List Status: <None>   Medication Order Taking? Sig Documenting Provider Last Dose Status Informant  ACCU-CHEK GUIDE TEST test strip [id number]  1 each by Other route daily at 6 (six) AM. [provider]  Active   Accu-Chek Softclix Lancets lancets [id number]  daily at 6 (six) AM. [provider]  Active   acetaminophen  (TYLENOL ) 500 MG tablet [id number]  Take 500-1,000 mg by mouth every 8 (eight) hours as needed for moderate pain. [provider]  Active Self  apixaban  (ELIQUIS ) 5 MG TABS tablet [id number]   Take 5 mg by mouth 2 (two) times daily. [provider]  Active Self  azithromycin (ZITHROMAX) 250 MG tablet [id number]  Take 250 mg by mouth daily.  Patient not taking: Reported on 11/01/2023   [provider]  Active   Blood Glucose Monitoring Suppl (ACCU-CHEK GUIDE) w/Device KIT [id number]  daily at 6 (six) AM. [provider]  Active   Calcium Carbonate-Vit D-Min (CALTRATE PLUS PO) [id number]  Take 1 capsule by mouth in the morning, at noon, and at bedtime. [provider]  Active Self  cetirizine (ZYRTEC) 10 MG tablet [id number]  Take 10 mg by mouth daily. [provider]  Active Self           Med Note (ROCHELLE, REBECCA G   Tue Jun 21, 2023  2:18 PM) PRN  cholecalciferol (VITAMIN D3) 25 MCG (1000 UNIT) tablet [id number]  Take 1,000 Units by mouth daily.  Patient not taking: Reported on 11/01/2023   [provider]  Active Self  dofetilide  (TIKOSYN ) 125 MCG capsule [id number]  TAKE THREE CAPSULES BY MOUTH TWICE DAILY (LEAVE in stock bottles) Leverne Charlies Helling, PA-C  Active   EMBECTA PEN NEEDLE NANO 2 GEN 32G X 4 MM MISC [id number]  as directed. [provider]  Active   Fluticasone-Salmeterol (ADVAIR) 100-50 MCG/DOSE AEPB [id number]  Inhale 1 puff into the lungs daily as needed (shortness of breath).  Patient not taking: Reported on 11/01/2023   [provider]  Active Self  furosemide  (LASIX ) 40 MG tablet [id number]  TAKE 1 TABLET BY MOUTH EVERY OTHER DAY, AS NEEDED FOR 2  DOSES  FOR  SWELLING Ursuy, Renee Lynn, PA-C  Active   glimepiride (AMARYL) 2 MG tablet [id number]  Take 4 mg by mouth in the morning and at bedtime. [provider]  Active Self  guaiFENesin (MUCINEX) 600 MG 12 hr tablet [id number]  Take 600 mg by mouth daily. [provider]  Active Self           Med Note MAUDE, REBECCA G   Tue Jun 21, 2023  2:17 PM) PRN  HYDROcodone-acetaminophen  (NORCO/VICODIN) 5-325 MG tablet [id number]  Take 1 tablet by  mouth every 6 (six) hours as needed for moderate pain. [provider]  Active Self  insulin glargine (LANTUS) 100 UNIT/ML injection [id number]  Inject 20 Units into the skin 2 (two) times daily. [provider]  Active   L-Lysine 500 MG TABS [id number]  Take 500 mg by mouth in the morning, at noon, and at bedtime. [provider]  Active Self  levalbuterol (XOPENEX) 0.63 MG/3ML nebulizer solution [id number]  Take 0.63 mg by nebulization every 6 (six) hours as needed for wheezing or shortness of breath.  Patient not taking: Reported on 11/01/2023   [provider]  Active Self  losartan (COZAAR) 100 MG tablet [id number]  Take 100 mg by mouth daily. [provider]  Active Self  magnesium   oxide (MAG-OX) 400 MG tablet [id number]  TAKE 1 TABLET BY MOUTH TWICE A DAY Burnard Debby LABOR, MD  Active Self  metoprolol  succinate (TOPROL  XL) 25 MG 24 hr tablet [id number]  Take 0.5 tablets (12.5 mg total) by mouth daily. Burnard Debby LABOR, MD  Active   multivitamin-lutein  (OCUVITE-LUTEIN ) CAPS capsule [id number]  Take 1 capsule by mouth daily. [provider]  Active Self  Omega-3 Fatty Acids (FISH OIL ) 1000 MG CAPS [id number]  Take 1,000 mg by mouth 3 (three) times daily.  [provider]  Active Self  predniSONE (DELTASONE) 10 MG tablet [id number]  Take 10 mg by mouth daily.  Patient not taking: Reported on 11/01/2023   [provider]  Active   promethazine-dextromethorphan (PROMETHAZINE-DM) 6.25-15 MG/5ML syrup [id number]  Take 5 mLs by mouth every 6 (six) hours as needed.  Patient not taking: Reported on 11/01/2023   [provider]  Active   triamcinolone  cream (KENALOG ) 0.1 % [id number]  Apply 1 application  topically daily as needed (rash). [provider]  Active Self            Recommendation:   Continue Current Plan of Care Educated patient on starting her day with breakfast, incorporating protein such as low-fat cottage  cheese, greek yogurt, eggs, or protein shake, discussed staying hydrated during the day by drinking water  and non-sweetened drinks, which may help with fatigue.   Patient will be getting flu vaccine middle of October and will discuss COVID vaccine recommendations with the [facility] staff.   Follow Up Plan:   Telephone follow-up in 1 month  Santana Stamp BSN, CCM [place]  [facility] Population Health RN Care Manager Direct Dial: [phone]  Fax: [phone]

## 2023-12-16 ENCOUNTER — Ambulatory Visit
Admission: RE | Admit: 2023-12-16 | Discharge: 2023-12-16 | Disposition: A | Discharge status: Home / Self Care | Source: Ambulatory Visit | Attending: Internal Medicine | Admitting: Internal Medicine

## 2023-12-16 DIAGNOSIS — Z1231 Encounter for screening mammogram for malignant neoplasm of breast: Secondary | ICD-10-CM | POA: Diagnosis not present

## 2023-12-16 NOTE — Telephone Encounter (Signed)
 DME=SYNAPSE  Order placed to [facility] via fax [phone].

## 2023-12-21 ENCOUNTER — Other Ambulatory Visit: Payer: Self-pay | Admitting: Physician Assistant

## 2023-12-29 DIAGNOSIS — B351 Tinea unguium: Secondary | ICD-10-CM | POA: Diagnosis not present

## 2023-12-29 DIAGNOSIS — E1142 Type 2 diabetes mellitus with diabetic polyneuropathy: Secondary | ICD-10-CM | POA: Diagnosis not present

## 2023-12-29 DIAGNOSIS — L84 Corns and callosities: Secondary | ICD-10-CM | POA: Diagnosis not present

## 2023-12-29 DIAGNOSIS — M79674 Pain in right toe(s): Secondary | ICD-10-CM | POA: Diagnosis not present

## 2023-12-29 DIAGNOSIS — M79675 Pain in left toe(s): Secondary | ICD-10-CM | POA: Diagnosis not present

## 2024-01-04 NOTE — Progress Notes (Signed)
 Nessa Ramaker                                          MRN: [id number]   01/04/2024   The [facility] Quality Team Specialist reviewed this patient medical record for the purposes of chart review for care gap closure. The following were reviewed: chart review for care gap closure-kidney health evaluation for diabetes:eGFR  and uACR.    [facility] Quality Team

## 2024-01-09 ENCOUNTER — Encounter: Payer: Self-pay | Admitting: *Deleted

## 2024-01-09 ENCOUNTER — Other Ambulatory Visit: Payer: Self-pay

## 2024-01-09 ENCOUNTER — Other Ambulatory Visit: Payer: Self-pay | Admitting: *Deleted

## 2024-01-09 NOTE — Patient Outreach (Signed)
 Complex Care Management   Visit Note  01/09/2024  Name:  Dominique Bauer MRN: [id number] DOB: Aug 20, 1945  Situation: Referral received for Complex Care Management related to Diabetes with Complications and Atrial Fibrillation I obtained verbal consent from Patient.  Visit completed with Patient  on the phone  Background:   Past Medical History:  Diagnosis Date   Arthritis    osteo arthritis   Body mass index 40.0-44.9, adult (HCC)    Diabetes (HCC)    Dysrhythmia    H/O measles    H/O mumps    H/O: whooping cough    History of chicken pox    HLD (hyperlipidemia)    Hypertension    Persistent atrial fibrillation (HCC)    PONV (postoperative nausea and vomiting)    Sciatica    Sleep apnea    not using aything at night right now/LH    Assessment: Patient Reported Symptoms:  Cognitive Cognitive Status: Alert and oriented to person, place, and time, Normal speech and language skills, No symptoms reported Cognitive/Intellectual Conditions Management [RPT]: None reported or documented in medical history or problem list   Health Maintenance Behaviors: Annual physical exam Healing Pattern: Unsure Health Facilitated by: Rest  Neurological Neurological Review of Symptoms: No symptoms reported    HEENT HEENT Symptoms Reported: Other: (ear pressure) HEENT Management Strategies: Routine screening, Medication therapy HEENT Self-Management Outcome: 4 (good) HEENT Comment: Covid 5 months ago that developed into secondary sinus infection. Treated with prednisone and doxycycline. Mostly better but has some pressure in her ears. No pain or drainage. Will try flonase to open eustachian tubes. Plans to follow-up with provider if no improvement or if she develops any new or worsening symptoms.    Cardiovascular Cardiovascular Symptoms Reported: No symptoms reported    Respiratory Respiratory Symptoms Reported: No symptoms reported    Endocrine Endocrine Symptoms Reported: No symptoms  reported Is patient diabetic?: Yes Is patient checking blood sugars at home?: Yes List most recent blood sugar readings, include date and time of day: 126 this morning Endocrine Self-Management Outcome: 4 (good)  Gastrointestinal Gastrointestinal Symptoms Reported: No symptoms reported      Genitourinary Genitourinary Symptoms Reported: No symptoms reported    Integumentary Integumentary Symptoms Reported: No symptoms reported    Musculoskeletal Musculoskelatal Symptoms Reviewed: Joint pain Other Musculoskeletal Symptoms: multiple areas of chronic joint pain Additional Musculoskeletal Details: manages with tylenol  and hydrocodone Musculoskeletal Management Strategies: Adequate rest, Medication therapy Musculoskeletal Self-Management Outcome: 4 (good) Falls in the past year?: No Number of falls in past year: 1 or less Was there an injury with Fall?: No Fall Risk Category Calculator: 0 Patient Fall Risk Level: Low Fall Risk    Psychosocial Psychosocial Symptoms Reported: No symptoms reported          01/09/2024    PHQ2-9 Depression Screening   Little interest or pleasure in doing things    Feeling down, depressed, or hopeless    PHQ-2 - Total Score    Trouble falling or staying asleep, or sleeping too much    Feeling tired or having little energy    Poor appetite or overeating     Feeling bad about yourself - or that you are a failure or have let yourself or your family down    Trouble concentrating on things, such as reading the newspaper or watching television    Moving or speaking so slowly that other people could have noticed.  Or the opposite - being so fidgety or restless that you have  been moving around a lot more than usual    Thoughts that you would be better off dead, or hurting yourself in some way    PHQ2-9 Total Score    If you checked off any problems, how difficult have these problems made it for you to do your work, take care of things at home, or get along  with other people    Depression Interventions/Treatment      There were no vitals filed for this visit.  Medications Reviewed Today     Reviewed by Charlsie Josette SAILOR, RN (Registered Nurse) on 01/09/24 at 1656  Med List Status: <None>   Medication Order Taking? Sig Documenting Provider Last Dose Status Informant  ACCU-CHEK GUIDE TEST test strip [id number] Yes 1 each by Other route daily at 6 (six) AM. [provider]  Active   Accu-Chek Softclix Lancets lancets [id number] Yes daily at 6 (six) AM. [provider]  Active   acetaminophen  (TYLENOL ) 500 MG tablet [id number] Yes Take 500-1,000 mg by mouth every 8 (eight) hours as needed for moderate pain. [provider]  Active Self  apixaban  (ELIQUIS ) 5 MG TABS tablet [id number] Yes Take 5 mg by mouth 2 (two) times daily. [provider]  Active Self  azithromycin (ZITHROMAX) 250 MG tablet [id number]  Take 250 mg by mouth daily.  Patient not taking: Reported on 01/09/2024   [provider]  Active   Blood Glucose Monitoring Suppl (ACCU-CHEK GUIDE) w/Device KIT [id number] Yes daily at 6 (six) AM. [provider]  Active   Calcium Carbonate-Vit D-Min (CALTRATE PLUS PO) [id number] Yes Take 1 capsule by mouth in the morning, at noon, and at bedtime. [provider]  Active Self  cetirizine (ZYRTEC) 10 MG tablet [id number] Yes Take 10 mg by mouth daily. [provider]  Active Self           Med Note (ROCHELLE, REBECCA G   Tue Jun 21, 2023  2:18 PM) PRN  cholecalciferol (VITAMIN D3) 25 MCG (1000 UNIT) tablet [id number]  Take 1,000 Units by mouth daily.  Patient not taking: Reported on 01/09/2024   [provider]  Active Self  dofetilide  (TIKOSYN ) 125 MCG capsule [id number] Yes TAKE THREE CAPSULES BY MOUTH TWICE DAILY (LEAVE in stock bottles) Leverne Charlies Helling, PA-C  Active   EMBECTA PEN NEEDLE NANO 2 GEN 32G X 4 MM MISC [id number] Yes as directed. [provider]  Active    Fluticasone-Salmeterol (ADVAIR) 100-50 MCG/DOSE AEPB [id number]  Inhale 1 puff into the lungs daily as needed (shortness of breath).  Patient not taking: Reported on 01/09/2024   [provider]  Active Self  furosemide  (LASIX ) 40 MG tablet [id number] Yes TAKE 1 TABLET BY MOUTH EVERY OTHER DAY, AS NEEDED FOR 2  DOSES  FOR  SWELLING Ursuy, Renee Lynn, PA-C  Active   glimepiride (AMARYL) 2 MG tablet [id number] Yes Take 4 mg by mouth in the morning and at bedtime. [provider]  Active Self  guaiFENesin (MUCINEX) 600 MG 12 hr tablet [id number] Yes Take 600 mg by mouth daily. [provider]  Active Self           Med Note MAUDE, REBECCA G   Tue Jun 21, 2023  2:17 PM) PRN  HYDROcodone-acetaminophen  (NORCO/VICODIN) 5-325 MG tablet [id number] Yes Take 1 tablet by mouth every 6 (six) hours as needed for moderate pain. [provider]  Active Self  insulin glargine (LANTUS) 100 UNIT/ML injection [id number] Yes Inject 25 Units  into the skin 2 (two) times daily. [provider]  Active   L-Lysine 500 MG TABS [id number] Yes Take 500 mg by mouth in the morning, at noon, and at bedtime. [provider]  Active Self  levalbuterol (XOPENEX) 0.63 MG/3ML nebulizer solution [id number]  Take 0.63 mg by nebulization every 6 (six) hours as needed for wheezing or shortness of breath.  Patient not taking: Reported on 01/09/2024   [provider]  Active Self  losartan (COZAAR) 100 MG tablet [id number] Yes Take 100 mg by mouth daily. [provider]  Active Self  magnesium  oxide (MAG-OX) 400 MG tablet [id number] Yes TAKE 1 TABLET BY MOUTH TWICE A DAY Burnard Debby LABOR, MD  Active Self  metoprolol  succinate (TOPROL  XL) 25 MG 24 hr tablet [id number] Yes Take 0.5 tablets (12.5 mg total) by mouth daily. Burnard Debby LABOR, MD  Active   multivitamin-lutein  (OCUVITE-LUTEIN ) CAPS capsule [id number] Yes Take 1 capsule by mouth daily. [provider]  Active  Self  Omega-3 Fatty Acids (FISH OIL ) 1000 MG CAPS [id number]  Take 1,000 mg by mouth 3 (three) times daily.  [provider]  Active Self  predniSONE (DELTASONE) 10 MG tablet [id number]  Take 10 mg by mouth daily.  Patient not taking: Reported on 01/09/2024   [provider]  Active   promethazine-dextromethorphan (PROMETHAZINE-DM) 6.25-15 MG/5ML syrup [id number]  Take 5 mLs by mouth every 6 (six) hours as needed.  Patient not taking: Reported on 01/09/2024   [provider]  Active   triamcinolone  cream (KENALOG ) 0.1 % [id number] Yes Apply 1 application  topically daily as needed (rash). [provider]  Active Self            Recommendation:   PCP Follow-up Specialty provider follow-up with cardiologist on 01/19/24 Continue Current Plan of Care  Follow Up Plan:   Telephone follow-up in 1 month  Josette Pellet, RN, BSN [place]  [facility] Population Health RN Care Manager Direct Dial: [phone]  Fax: [phone]

## 2024-01-09 NOTE — Patient Instructions (Signed)
 Visit Information  Thank you for taking time to visit with me today. Please don't hesitate to contact me if I can be of assistance to you before our next scheduled appointment.  Your next care management appointment is by telephone on 02/08/24 at 2:00  Please call the care guide team at [phone] if you need to cancel, schedule, or reschedule an appointment.   Please call the [facility]: [phone] call [phone] if you are experiencing a Mental Health or Behavioral Health Crisis or need someone to talk to.  Josette Pellet, RN, BSN [place]  [facility] Health RN Care Manager Direct Dial: [phone]  Fax: [phone]

## 2024-01-11 ENCOUNTER — Encounter (INDEPENDENT_AMBULATORY_CARE_PROVIDER_SITE_OTHER): Payer: Self-pay | Admitting: Gastroenterology

## 2024-01-13 NOTE — Progress Notes (Unsigned)
 Cardiology Office Note:    Date:  01/19/2024   ID:  Dominique Bauer, DOB 07/22/1945, MRN [id number]  PCP:  Orpha Yancey LABOR, MD  Cardiologist:  Dr. Maude Emmer   Electrophysiologist:  n/a  Referring MD: Orpha Yancey LABOR, MD   Chief Complaint  Patient presents with   Atrial Fibrillation   Follow-up    History of Present Illness:    78 y.o. with PAF. History of HTN and OSA. Failed DCC alone and on flecainide  01/07/16. Subsequently begun on Tikosyn  in hospital with successful DCC 04/29/16.  Seen by Dr Fernande 02/22/17 and beta blocker stopped due to relative bradycardia Last TTE done 08/14/20  No valve disease normal EF LA diameter 5.0 cm    CHADS2-VASc=5       Uses CPAP for OSA.  Sees Kelly  Januvia d/c 2019  due to frequent infections on it  She has some LE edema.Takes her Aldactone  and lasix  sporadically the latter less to avoid hypokalemia on Tikosyn    Primary put her on Nexlitol in July 2021 Myalgias with statins LDL 73 A1c 8 Cr .9 K 4.5   No cardiac complaints Discussed needing better control of her BS Had cataract removed and lens implant right eye 08/03/21  Colonoscopy done 09/20/22 with multiple polyps removed Dr Eartha  Not needing to take lasix  much for LE edema Tends to be worse in LLE with prior DVT in that leg Did not tolerate Ozempic Made BS higher and gave her abdominal pain No on lantus insulin   Earlier in year had bad sinus/covid infection. Rx with zithromax and paxlovid Took about 2 months to recover   Prior CV studies that were reviewed today include:    ETT 12/04/15 Blood pressure demonstrated a normal response to exercise. There was no ST segment deviation noted during stress. Negative, adequate stress test.  TTE 08/14/20 IMPRESSIONS     1. Left ventricular ejection fraction, by estimation, is 60 to 65%. The  left ventricle has normal function. The left ventricle has no regional  wall motion abnormalities. The left ventricular internal cavity size  was  mildly dilated. Left ventricular  diastolic parameters are consistent with Grade I diastolic dysfunction  (impaired relaxation).   2. Right ventricular systolic function is normal. The right ventricular  size is normal. Tricuspid regurgitation signal is inadequate for assessing  PA pressure.   3. Left atrial size was moderately dilated.   4. Right atrial size was mildly dilated.   5. The mitral valve is normal in structure. No evidence of mitral valve  regurgitation. No evidence of mitral stenosis.   6. The aortic valve is tricuspid. Aortic valve regurgitation is not  visualized. No aortic stenosis is present.   Past Medical History:  Diagnosis Date   Arthritis    osteo arthritis   Body mass index 40.0-44.9, adult (HCC)    Diabetes (HCC)    Dysrhythmia    H/O measles    H/O mumps    H/O: whooping cough    History of chicken pox    HLD (hyperlipidemia)    Hypertension    Persistent atrial fibrillation (HCC)    PONV (postoperative nausea and vomiting)    Sciatica    Sleep apnea    not using aything at night right now/LH    Past Surgical History:  Procedure Laterality Date   ABDOMINAL HYSTERECTOMY     BREAST BIOPSY Right 02/01/2012   BREAST EXCISIONAL BIOPSY Bilateral    benign   CARDIOVERSION N/A  11/17/2015   Procedure: CARDIOVERSION;  Surgeon: Maude JAYSON Emmer, MD;  Location: [facility] ENDOSCOPY;  Service: Cardiovascular;  Laterality: N/A;   CARDIOVERSION N/A 01/07/2016   Procedure: CARDIOVERSION;  Surgeon: Maude JAYSON Emmer, MD;  Location: [facility] ENDOSCOPY;  Service: Cardiovascular;  Laterality: N/A;   CARDIOVERSION N/A 04/29/2016   Procedure: CARDIOVERSION;  Surgeon: Jerel Balding, MD;  Location: [facility] ENDOSCOPY;  Service: Cardiovascular;  Laterality: N/A;   CATARACT EXTRACTION W/PHACO Left 07/20/2021   Procedure: CATARACT EXTRACTION PHACO AND INTRAOCULAR LENS PLACEMENT (IOC);  Surgeon: Harrie Agent, MD;  Location: [facility] ORS;  Service: Ophthalmology;  Laterality: Left;  CDE: 10.07    CATARACT EXTRACTION W/PHACO Right 08/03/2021   Procedure: CATARACT EXTRACTION PHACO AND INTRAOCULAR LENS PLACEMENT (IOC);  Surgeon: Harrie Agent, MD;  Location: [facility] ORS;  Service: Ophthalmology;  Laterality: Right;  CDE 7.93   COLONOSCOPY WITH PROPOFOL  N/A 09/20/2022   Procedure: COLONOSCOPY WITH PROPOFOL ;  Surgeon: Eartha Angelia Sieving, MD;  Location: [facility] ENDO SUITE;  Service: Gastroenterology;  Laterality: N/A;  9:30am;asa 3   DG  BONE DENSITY ([facility] HX)     INNER EAR SURGERY Left    ear drum replacement X2   NASAL SEPTUM SURGERY     POLYPECTOMY  09/20/2022   Procedure: POLYPECTOMY;  Surgeon: Eartha Angelia, Sieving, MD;  Location: [facility] ENDO SUITE;  Service: Gastroenterology;;  cold snare, cold biopsy    Current Medications: Current Meds  Medication Sig   ACCU-CHEK GUIDE TEST test strip 1 each by Other route daily at 6 (six) AM.   Accu-Chek Softclix Lancets lancets daily at 6 (six) AM.   acetaminophen  (TYLENOL ) 500 MG tablet Take 500-1,000 mg by mouth every 8 (eight) hours as needed for moderate pain.   apixaban  (ELIQUIS ) 5 MG TABS tablet Take 5 mg by mouth 2 (two) times daily.   Blood Glucose Monitoring Suppl (ACCU-CHEK GUIDE) w/Device KIT daily at 6 (six) AM.   Calcium Carbonate-Vit D-Min (CALTRATE PLUS PO) Take 1 capsule by mouth in the morning, at noon, and at bedtime.   cetirizine (ZYRTEC) 10 MG tablet Take 10 mg by mouth daily.   cholecalciferol (VITAMIN D3) 25 MCG (1000 UNIT) tablet Take 1,000 Units by mouth daily.   dofetilide  (TIKOSYN ) 125 MCG capsule TAKE THREE CAPSULES BY MOUTH TWICE DAILY (LEAVE in stock bottles)   EMBECTA PEN NEEDLE NANO 2 GEN 32G X 4 MM MISC as directed.   Fluticasone-Salmeterol (ADVAIR) 100-50 MCG/DOSE AEPB Inhale 1 puff into the lungs daily as needed (shortness of breath).   furosemide  (LASIX ) 40 MG tablet TAKE 1 TABLET BY MOUTH EVERY OTHER DAY, AS NEEDED FOR 2  DOSES  FOR  SWELLING   glimepiride (AMARYL) 2 MG tablet Take 4 mg by mouth in the morning and  at bedtime.   guaiFENesin (MUCINEX) 600 MG 12 hr tablet Take 600 mg by mouth daily.   HYDROcodone-acetaminophen  (NORCO/VICODIN) 5-325 MG tablet Take 1 tablet by mouth every 6 (six) hours as needed for moderate pain.   insulin glargine (LANTUS) 100 UNIT/ML injection Inject 25 Units into the skin 2 (two) times daily.   L-Lysine 500 MG TABS Take 500 mg by mouth in the morning, at noon, and at bedtime.   levalbuterol (XOPENEX) 0.63 MG/3ML nebulizer solution Take 0.63 mg by nebulization every 6 (six) hours as needed for wheezing or shortness of breath.   losartan (COZAAR) 100 MG tablet Take 100 mg by mouth daily.   magnesium  oxide (MAG-OX) 400 MG tablet TAKE 1 TABLET BY MOUTH TWICE A DAY  metoprolol  succinate (TOPROL  XL) 25 MG 24 hr tablet Take 0.5 tablets (12.5 mg total) by mouth daily.   multivitamin-lutein  (OCUVITE-LUTEIN ) CAPS capsule Take 1 capsule by mouth daily.   Omega-3 Fatty Acids (FISH OIL ) 1000 MG CAPS Take 1,000 mg by mouth 3 (three) times daily.    triamcinolone  cream (KENALOG ) 0.1 % Apply 1 application  topically daily as needed (rash).     Allergies:   Penicillins, Other, and Neosporin [neomycin-bacitracin zn-polymyx]   Social History   Socioeconomic History   Marital status: Married    Spouse name: Not on file   Number of children: Not on file   Years of education: Not on file   Highest education level: Not on file  Occupational History   Not on file  Tobacco Use   Smoking status: Never    Passive exposure: Past   Smokeless tobacco: Never  Vaping Use   Vaping status: Never Used  Substance and Sexual Activity   Alcohol use: No   Drug use: No   Sexual activity: Yes  Other Topics Concern   Not on file  Social History Narrative   Retired Engineer, civil (consulting).  Lives in [place].   Social Drivers of Corporate investment banker Strain: Low Risk  (03/16/2023)   Overall Financial Resource Strain (CARDIA)    Difficulty of Paying Living Expenses: Not very hard  Food Insecurity: No Food  Insecurity (01/09/2024)   Hunger Vital Sign    Worried About Running Out of Food in the Last Year: Never true    Ran Out of Food in the Last Year: Never true  Transportation Needs: No Transportation Needs (06/08/2023)   PRAPARE - Administrator, Civil Service (Medical): No    Lack of Transportation (Non-Medical): No  Physical Activity: Inactive (07/05/2023)   Exercise Vital Sign    Days of Exercise per Week: 0 days    Minutes of Exercise per Session: 0 min  Stress: Not on file  Social Connections: Not on file     Family History:  The patient's family history includes Breast cancer in her maternal aunt and sister; Colon cancer in her maternal grandmother, paternal aunt, and paternal aunt; Hypertension in her mother.   ROS:   Please see the history of present illness.    Review of Systems  Constitutional: Positive for diaphoresis and malaise/fatigue.  Cardiovascular:  Positive for dyspnea on exertion and irregular heartbeat.  Respiratory:  Positive for snoring.   Skin:  Positive for rash.  Musculoskeletal:  Positive for back pain, joint swelling and myalgias.  Neurological:  Positive for dizziness and headaches.   All other systems reviewed and are negative.   EKGs/Labs/Other Test Reviewed:    EKG:   05/15/20 SB rate 59 first degree QT 440  01/19/2024 SR rate 69 PR 228 msec otherwise normal   Recent Labs: 11/01/2023: BUN 15; Creatinine, Ser 0.73; Hemoglobin 12.9; Magnesium  1.9; Platelets 223; Potassium 4.4; Sodium 136   Recent Lipid Panel No results found for: CHOL, TRIG, HDL, CHOLHDL, VLDL, LDLCALC, LDLDIRECT   Physical Exam:    VS:  BP (!) 162/73 (BP Location: Right Arm, Patient Position: Sitting, Cuff Size: Large)   Pulse 69   Resp 16   Ht 5' 8 (1.727 m)   Wt 273 lb 6.4 oz (124 kg)   SpO2 95%   BMI 41.57 kg/m     Wt Readings from Last 3 Encounters:  01/19/24 273 lb 6.4 oz (124 kg)  11/01/23 270 lb (122.5 kg)  06/30/23 258 lb 12.8 oz (117.4  kg)    Affect appropriate Overweight white female  HEENT: normal Neck supple with no adenopathy JVP normal no bruits no thyromegaly Lungs clear with no wheezing and good diaphragmatic motion Heart:  S1/S2 no murmur, no rub, gallop or click PMI normal Abdomen: benighn, BS positve, no tenderness, no AAA no bruit.  No HSM or HJR Distal pulses intact with no bruits Plus one bilateral edema Neuro non-focal Skin warm and dry No muscular weakness   PLAN:    In order of problems listed above:  1. PAF:  Maintaining NSR on Tikosyn  labs and QT ok f/u Jenna Martes Continue eliquis    2. HTN - Well controlled.  Continue current medications and low sodium Dash type diet.     3. OSA - F/U with Dr Burnard regarding issues with CPAP  4. DM:  Ozempic d/c on lantus f/u primary   5. Dyspnea:  Likely from obesity and chronic bronchitis TTE done 08/14/20 EF 60-65% no valve disease Takes aldacone daily and PRN lasix  per Dr Fernande will update TTE    6. HLD:  On Nexlitol labs with primary   Echo for PAF and dyspnea  Non contrast high res chest CT given prolonged covid infection  F/U in a year   [employer]

## 2024-01-19 ENCOUNTER — Ambulatory Visit: Attending: Cardiovascular Disease | Admitting: Cardiovascular Disease

## 2024-01-19 VITALS — BP 162/73 | HR 69 | Resp 16 | Ht 68.0 in | Wt 273.4 lb

## 2024-01-19 DIAGNOSIS — R0600 Dyspnea, unspecified: Secondary | ICD-10-CM | POA: Diagnosis not present

## 2024-01-19 DIAGNOSIS — I480 Paroxysmal atrial fibrillation: Secondary | ICD-10-CM | POA: Diagnosis not present

## 2024-01-19 NOTE — Patient Instructions (Signed)
 Medication Instructions:  NO CHANGES  *If you need a refill on your cardiac medications before your next appointment, please call your pharmacy*  Testing/Procedures: Your physician has requested that you have an echocardiogram. Echocardiography is a painless test that uses sound waves to create images of your heart. It provides your doctor with information about the size and shape of your heart and how well your heart's chambers and valves are working. This procedure takes approximately one hour. There are no restrictions for this procedure. Please do NOT wear cologne, perfume, aftershave, or lotions (deodorant is allowed). Please arrive 15 minutes prior to your appointment time.  Please note: We ask at that you not bring children with you during ultrasound (echo/ vascular) testing. Due to room size and safety concerns, children are not allowed in the ultrasound rooms during exams. Our front office staff cannot provide observation of children in our lobby area while testing is being conducted. An adult accompanying a patient to their appointment will only be allowed in the ultrasound room at the discretion of the ultrasound technician under special circumstances. We apologize for any inconvenience.  Chest CT - high res  ** can arrange at [facility]  Follow-Up: At [facility], you and your health needs are our priority.  As part of our continuing mission to provide you with exceptional heart care, our providers are all part of one team.  This team includes your primary Cardiologist (physician) and Advanced Practice Providers or APPs (Physician Assistants and Nurse Practitioners) who all work together to provide you with the care you need, when you need it.  Your next appointment:    12 months with Dr. Delford  We recommend signing up for the patient portal called MyChart.  Sign up information is provided on this After Visit Summary.  MyChart is used to connect with patients for  Virtual Visits (Telemedicine).  Patients are able to view lab/test results, encounter notes, upcoming appointments, etc.  Non-urgent messages can be sent to your provider as well.   To learn more about what you can do with MyChart, go to [email or web address].

## 2024-01-27 ENCOUNTER — Ambulatory Visit (HOSPITAL_COMMUNITY)
Admission: RE | Admit: 2024-01-27 | Discharge: 2024-01-27 | Disposition: A | Discharge status: Home / Self Care | Source: Ambulatory Visit | Attending: Cardiovascular Disease | Admitting: Cardiovascular Disease

## 2024-01-27 DIAGNOSIS — R0600 Dyspnea, unspecified: Secondary | ICD-10-CM | POA: Insufficient documentation

## 2024-01-30 ENCOUNTER — Ambulatory Visit: Payer: Self-pay | Admitting: Cardiovascular Disease

## 2024-02-08 ENCOUNTER — Other Ambulatory Visit: Payer: Self-pay

## 2024-02-08 ENCOUNTER — Encounter: Payer: Self-pay | Admitting: *Deleted

## 2024-02-08 ENCOUNTER — Other Ambulatory Visit: Payer: Self-pay | Admitting: *Deleted

## 2024-02-08 NOTE — Patient Instructions (Signed)
 Visit Information  Thank you for taking time to visit with me today. Please don't hesitate to contact me if I can be of assistance to you before our next scheduled appointment.  Your next care management appointment is by telephone on 02/13/24 at 2:30  Please call the care guide team at [phone] if you need to cancel, schedule, or reschedule an appointment.   Please call the [facility]: [phone] call 911 if you are experiencing a Mental Health or Behavioral Health Crisis or need someone to talk to.  Josette Pellet, RN, BSN [place]  [facility] RN Care Manager Direct Dial: [phone]  Fax: [phone]

## 2024-02-13 ENCOUNTER — Telehealth: Admitting: *Deleted

## 2024-02-17 ENCOUNTER — Telehealth: Payer: Self-pay

## 2024-02-17 ENCOUNTER — Other Ambulatory Visit: Payer: Self-pay

## 2024-02-17 DIAGNOSIS — G4733 Obstructive sleep apnea (adult) (pediatric): Secondary | ICD-10-CM

## 2024-02-17 NOTE — Telephone Encounter (Signed)
 Pulmonary referral to [facility] per patient. OK per Dr. Delford.

## 2024-02-17 NOTE — Telephone Encounter (Signed)
-----   Message from Nurse Lyle S sent at 02/15/2024  1:46 PM EST ----- Regarding: FW: Referral to pulmonologist  ----- Message ----- From: Delford Maude BROCKS, MD Sent: 02/14/2024   8:44 AM EST To: Lyle KATHEE Rigg, RN Subject: RE: Referral to pulmonologist                  That's fine ----- Message ----- From: Rigg Lyle KATHEE, RN Sent: 02/13/2024   4:29 PM EST To: Maude BROCKS Delford, MD Subject: FW: Referral to pulmonologist                  Please review and advise on referral. ----- Message ----- From: Charlsie Josette SAILOR, RN Sent: 02/13/2024   3:10 PM EST To: Lyle KATHEE Rigg, RN Subject: Referral to pulmonologist                      Hi Lyle,  You spoke with Ms Guida about her CT results and advised that Dr Delford recommended a pulmonary referral. Patient is agreeable but would like to to see a pulmonologist that can manage both her airway disease and OSA instead of adding on an extra provider since she hasn't established with Dr Shlomo for OSA yet. She prefers [facility] if Dr Delford is agreeable to refer for both conditions.   Please let me know if I can be of any assistance.   Thank you,  Josette Charlsie, RN, BSN [place]  [facility] RN Care Manager Direct Dial: [phone]  Fax: [phone]

## 2024-03-02 ENCOUNTER — Ambulatory Visit (HOSPITAL_COMMUNITY)
Admission: RE | Admit: 2024-03-02 | Discharge: 2024-03-02 | Attending: Cardiovascular Disease | Admitting: Cardiovascular Disease

## 2024-03-02 ENCOUNTER — Telehealth: Payer: Self-pay

## 2024-03-02 DIAGNOSIS — I480 Paroxysmal atrial fibrillation: Secondary | ICD-10-CM

## 2024-03-02 DIAGNOSIS — R0600 Dyspnea, unspecified: Secondary | ICD-10-CM

## 2024-03-02 DIAGNOSIS — R0609 Other forms of dyspnea: Secondary | ICD-10-CM

## 2024-03-02 LAB — ECHOCARDIOGRAM COMPLETE
AR max vel: 2.72 cm2
AV Area VTI: 2.67 cm2
AV Area mean vel: 2.72 cm2
AV Mean grad: 7.5 mmHg
AV Peak grad: 10.7 mmHg
Ao pk vel: 1.64 m/s
Area-P 1/2: 1.56 cm2
Est EF: >75
S' Lateral: 2.9 cm

## 2024-03-02 MED ORDER — PERFLUTREN LIPID MICROSPHERE
1.0000 mL | INTRAVENOUS | Status: AC | PRN
  Administered 2024-03-02: 3 mL via INTRAVENOUS

## 2024-03-02 NOTE — Telephone Encounter (Signed)
 Spoke to patient and requested that bring SD card to 03/05/2024 appt.

## 2024-03-02 NOTE — Progress Notes (Signed)
*  PRELIMINARY RESULTS* Echocardiogram 2D Echocardiogram has been performed with Definity .  Teresa Aida PARAS 03/02/2024, 3:18 PM

## 2024-03-05 ENCOUNTER — Ambulatory Visit: Admitting: Pulmonary Disease

## 2024-03-16 NOTE — Patient Outreach (Signed)
 Complex Care Management   Visit Note  02/08/2024  Name:  Dominique Bauer MRN: [id number] DOB: 01-01-1946  Situation: Referral received for Complex Care Management related to Afib and diabetes.Today's visit also focused on pulmonary condition. I obtained verbal consent from Patient.  Visit completed with Patient  on the phone  Background:   Past Medical History:  Diagnosis Date   Arthritis    osteo arthritis   Body mass index 40.0-44.9, adult (HCC)    Diabetes (HCC)    Dysrhythmia    H/O measles    H/O mumps    H/O: whooping cough    History of chicken pox    HLD (hyperlipidemia)    Hypertension    Persistent atrial fibrillation (HCC)    PONV (postoperative nausea and vomiting)    Sciatica    Sleep apnea    not using aything at night right now/LH    Assessment: Patient Reported Symptoms:  Cognitive Cognitive Status: Alert and oriented to person, place, and time, Normal speech and language skills, No symptoms reported Cognitive/Intellectual Conditions Management [RPT]: None reported or documented in medical history or problem list   Health Maintenance Behaviors: Annual physical exam Healing Pattern: Unsure Health Facilitated by: Rest  Neurological Neurological Review of Symptoms: Not assessed    HEENT HEENT Symptoms Reported: Not assessed      Cardiovascular Cardiovascular Symptoms Reported: No symptoms reported Does patient have uncontrolled Hypertension?: No Cardiovascular Management Strategies: Medication therapy, Routine screening Cardiovascular Self-Management Outcome: 4 (good) Cardiovascular Comment: Saw Dr. Delford on 01/19/24. Echocardiogram scheduled for 03/02/24. Dr Almetta (electrophysiology/cardiology) scheduled for 05/18/24.  Respiratory Respiratory Symptoms Reported: Shortness of breath Other Respiratory Symptoms: continues to have some SOB S/P Covid infection infection earlier this year Additional Respiratory Details: CT Chest 01/27/24: Lobular air  trapping on expiratory phase imaging, consistent with  small airways disease. Background of fine centrilobular nodularity most concentrated in the lung apices, most commonly seen in smoking-related respiratory bronchiolitis.    Endocrine Endocrine Symptoms Reported: Not assessed    Gastrointestinal Gastrointestinal Symptoms Reported: No symptoms reported      Genitourinary Genitourinary Symptoms Reported: Not assessed    Integumentary Integumentary Symptoms Reported: Not assessed    Musculoskeletal Musculoskelatal Symptoms Reviewed: Joint pain, Other Other Musculoskeletal Symptoms: Right knee pain x 3 months. No known injury. pain is worse when first standing Musculoskeletal Management Strategies: Adequate rest Musculoskeletal Self-Management Outcome: 3 (uncertain) Musculoskeletal Comment: Recommended orthopedic evaluation. Provided contact number for [facility] and advised that Dr Melodi specializes in knee conditions. Does not require a referral from PCP. Patient will call to schedule appointment. Also advised of Urgent Care at [facility] for any acute needs. Falls in the past year?: No Number of falls in past year: 1 or less Was there an injury with Fall?: No Fall Risk Category Calculator: 0 Patient Fall Risk Level: Low Fall Risk Fall risk Follow up: Falls prevention discussed  Psychosocial            03/16/2024    PHQ2-9 Depression Screening   Little interest or pleasure in doing things    Feeling down, depressed, or hopeless    PHQ-2 - Total Score    Trouble falling or staying asleep, or sleeping too much    Feeling tired or having little energy    Poor appetite or overeating     Feeling bad about yourself - or that you are a failure or have let yourself or your family down    Trouble concentrating on things,  such as reading the newspaper or watching television    Moving or speaking so slowly that other people could have noticed.  Or the opposite - being so fidgety or  restless that you have been moving around a lot more than usual    Thoughts that you would be better off dead, or hurting yourself in some way    PHQ2-9 Total Score    If you checked off any problems, how difficult have these problems made it for you to do your work, take care of things at home, or get along with other people    Depression Interventions/Treatment      There were no vitals filed for this visit.    Medications Reviewed Today     Reviewed by Charlsie Josette SAILOR, RN (Registered Nurse) on 02/08/24 at 1646  Med List Status: <None>   Medication Order Taking? Sig Documenting Provider Last Dose Status Informant  ACCU-CHEK GUIDE TEST test strip [id number] Yes 1 each by Other route daily at 6 (six) AM. [provider]  Active   Accu-Chek Softclix Lancets lancets [id number] Yes daily at 6 (six) AM. [provider]  Active   acetaminophen  (TYLENOL ) 500 MG tablet [id number] Yes Take 500-1,000 mg by mouth every 8 (eight) hours as needed for moderate pain. [provider]  Active Self  apixaban  (ELIQUIS ) 5 MG TABS tablet [id number] Yes Take 5 mg by mouth 2 (two) times daily. [provider]  Active Self  Blood Glucose Monitoring Suppl (ACCU-CHEK GUIDE) w/Device KIT [id number] Yes daily at 6 (six) AM. [provider]  Active   Calcium Carbonate-Vit D-Min (CALTRATE PLUS PO) [id number] Yes Take 1 capsule by mouth in the morning, at noon, and at bedtime. [provider]  Active Self  cetirizine (ZYRTEC) 10 MG tablet [id number] Yes Take 10 mg by mouth daily. [provider]  Active Self           Med Note (ROCHELLE, REBECCA G   Tue Jun 21, 2023  2:18 PM) PRN  cholecalciferol (VITAMIN D3) 25 MCG (1000 UNIT) tablet [id number] Yes Take 1,000 Units by mouth daily. [provider]  Active Self  dofetilide  (TIKOSYN ) 125 MCG capsule [id number] Yes TAKE THREE CAPSULES BY MOUTH TWICE DAILY (LEAVE in stock bottles) Leverne Charlies Helling, PA-C  Active    EMBECTA PEN NEEDLE NANO 2 GEN 32G X 4 MM MISC [id number] Yes as directed. [provider]  Active   Fluticasone-Salmeterol (ADVAIR) 100-50 MCG/DOSE AEPB [id number] Yes Inhale 1 puff into the lungs daily as needed (shortness of breath). [provider]  Active Self  furosemide  (LASIX ) 40 MG tablet [id number] Yes TAKE 1 TABLET BY MOUTH EVERY OTHER DAY, AS NEEDED FOR 2  DOSES  FOR  SWELLING Ursuy, Renee Lynn, PA-C  Active   glimepiride (AMARYL) 2 MG tablet [id number] Yes Take 4 mg by mouth in the morning and at bedtime. [provider]  Active Self  guaiFENesin (MUCINEX) 600 MG 12 hr tablet [id number] Yes Take 600 mg by mouth daily. [provider]  Active Self           Med Note MAUDE, REBECCA G   Tue Jun 21, 2023  2:17 PM) PRN  HYDROcodone-acetaminophen  (NORCO/VICODIN) 5-325 MG tablet [id number] Yes Take 1 tablet by mouth every 6 (six) hours as needed for moderate pain. [provider]  Active Self  insulin glargine (LANTUS) 100 UNIT/ML injection [id number] Yes Inject 25 Units into the skin 2 (two) times daily. [provider]  Active   L-Lysine 500 MG TABS [id number] Yes Take 500 mg by mouth in the morning, at noon, and at bedtime. [provider]  Active Self  levalbuterol (XOPENEX) 0.63 MG/3ML nebulizer solution [id number] Yes Take 0.63 mg by nebulization every 6 (six) hours as needed for wheezing or shortness of breath. [provider]  Active Self  losartan (COZAAR) 100 MG tablet [id number] Yes Take 100 mg by mouth daily. [provider]  Active Self  magnesium  oxide (MAG-OX) 400 MG tablet [id number] Yes TAKE 1 TABLET BY MOUTH TWICE A DAY Burnard Debby LABOR, MD  Active Self  metoprolol  succinate (TOPROL  XL) 25 MG 24 hr tablet [id number] Yes Take 0.5 tablets (12.5 mg total) by mouth daily. Burnard Debby LABOR, MD  Active   multivitamin-lutein  (OCUVITE-LUTEIN ) CAPS capsule [id number] Yes Take 1 capsule by mouth daily. [provider]  Active Self  Omega-3 Fatty Acids (FISH OIL ) 1000 MG CAPS [id number] Yes Take 1,000 mg by mouth 3 (three) times daily.  [provider]  Active Self  triamcinolone  cream (KENALOG ) 0.1 % [id number] Yes Apply 1 application  topically daily as needed (rash). [provider]  Active Self            Recommendation:   Specialty provider follow-up pulmonologist  Follow Up Plan:   Telephone follow-up in 1 month  Josette Pellet, RN, BSN [place]  [facility] RN Care Manager Direct Dial: [phone]  Fax: [phone]

## 2024-03-20 ENCOUNTER — Ambulatory Visit (INDEPENDENT_AMBULATORY_CARE_PROVIDER_SITE_OTHER): Admitting: Pulmonary Disease

## 2024-03-20 ENCOUNTER — Encounter: Payer: Self-pay | Admitting: Pulmonary Disease

## 2024-03-20 VITALS — BP 128/62 | HR 67 | Temp 98.6°F | Ht 66.0 in | Wt 271.8 lb

## 2024-03-20 DIAGNOSIS — Z6841 Body Mass Index (BMI) 40.0 and over, adult: Secondary | ICD-10-CM | POA: Diagnosis not present

## 2024-03-20 DIAGNOSIS — E66813 Obesity, class 3: Secondary | ICD-10-CM

## 2024-03-20 DIAGNOSIS — G4721 Circadian rhythm sleep disorder, delayed sleep phase type: Secondary | ICD-10-CM

## 2024-03-20 DIAGNOSIS — G4733 Obstructive sleep apnea (adult) (pediatric): Secondary | ICD-10-CM | POA: Diagnosis not present

## 2024-03-20 DIAGNOSIS — R0602 Shortness of breath: Secondary | ICD-10-CM

## 2024-03-20 DIAGNOSIS — Z9989 Dependence on other enabling machines and devices: Secondary | ICD-10-CM

## 2024-03-20 NOTE — Progress Notes (Signed)
 "  Synopsis: Referred in 01/2024 for OSA by Delford Maude BROCKS, MD  Subjective:   PATIENT ID: Dominique Bauer GENDER: female DOB: 04-06-1945, MRN: [id number]  Chief Complaint  Patient presents with   Obstructive Sleep Apnea    HPI  Dominique Bauer is a 78 y.o. patient with hypertension, T2DM, atrial fibrillation (on A/C) and OSA on CPAP who presents today for management of OSA. She previously was seen by Dr. Debby Sor who is retiring.  Per their note on 06/21/2023, the patient has previously failed antiarrhythmic therapy and cardioversion for their atrial fibrillation, which prompted evaluation for sleep apnea. PSG performed 02/15/2016 showed severe OSA with AHI of 31.2/hr. REM AHI was 58.3/hr, supine AHI 46.1/hr, and O2 desaturations to 69% were reported. She underwent a CPAP Titration study on 05/05/2016 after which CPAP 10-18 cm H2O was recommended, though her pressure setting was later revised to 10-16 cm H2O due to pressure intolerance. Weight at time of titration was 266 lbs (per titration report). Mask: FFM. Throughout 2018 she demonstrated excellent adherence with CPAP use >\= 4 hrs for 97% of days, though average duration of use was only 4 hrs 30 mins. As of Dr. Joesphine most recent note her CPAP settings are AutoCPAP 10-16 cm H2O. She has also been noted to have circadian sleep-wake phase disorder (delayed type). Her intrinsic bedtime is around 1-3 am and wake time around 11 am.  Sleep habits: - Bedtime: 1-3 am - Sleep latency: Varies (0-1 hrs) - Number of awakenings per night: 0-1x/night - Wake time: 11 am - noon - EDS symptoms: Yes - takes a nap in the afternoon most days. - Sleep aids: None - Stimulant use: None - Mask: FFM (previously tried nasal pillows mask but did not like it).  Comorbidities: - Hypertension: Yes - Diabetes: Yes - Obesity: Yes  Other: - GLP-1 use: No - patient previously tried Wegovy but was unable to tolerate GI side-effects. Does not wish  to try a different GLP-1 medication at this time.  Pulm Questionnaires: ESS 7 today.  CPAP Data: - Usage days >\= 4h: 100% - Median usage (days used): 6 hrs 30 mins - Pressure: Median 13.8, 95% 14.1 - Leak: Median 1.6, 95% 10.1 - Device-estimated AHI: 1/hr  Her atrial fibrillation is managed with Eliquis  and dofetilide ; she is currently in sinus rhythm. Hypertension is managed with metoprolol  tartrate and losartan. Other medical problems include obesity and lower extremity dependent edema which she takes furosemide  on an as needed basis only.  She does report shortness of breath that worsens when climbing stairs. She reports her breathing also worsens when exposed to cigarette smoke or perfumes. She does not use Advair -- I have removed this from her medication list. She has never had PFTs performed.   With respect to the recent CT Chest she had performed with multiple findings including some micronodules -- she is a lifelong never-smoker, has no family history of lung cancer, and does not have ILD, therefore can be considered at low risk for lung cancer.   Past Medical History:  Diagnosis Date   Arthritis    osteo arthritis   Body mass index 40.0-44.9, adult (HCC)    Diabetes (HCC)    Dysrhythmia    H/O measles    H/O mumps    H/O: whooping cough    History of chicken pox    HLD (hyperlipidemia)    Hypertension    Persistent atrial fibrillation (HCC)    PONV (postoperative nausea and  vomiting)    Sciatica    Sleep apnea    not using aything at night right now/LH     Family History  Problem Relation Age of Onset   Hypertension Mother    Breast cancer Sister    Colon cancer Maternal Grandmother        62s-80s   Breast cancer Maternal Aunt    Colon cancer Paternal Aunt        55s-80s   Colon cancer Paternal Aunt        50s-80s     Past Surgical History:  Procedure Laterality Date   ABDOMINAL HYSTERECTOMY     BREAST BIOPSY Right 02/01/2012   BREAST EXCISIONAL  BIOPSY Bilateral    benign   CARDIOVERSION N/A 11/17/2015   Procedure: CARDIOVERSION;  Surgeon: Maude JAYSON Emmer, MD;  Location: [facility] ENDOSCOPY;  Service: Cardiovascular;  Laterality: N/A;   CARDIOVERSION N/A 01/07/2016   Procedure: CARDIOVERSION;  Surgeon: Maude JAYSON Emmer, MD;  Location: [facility] ENDOSCOPY;  Service: Cardiovascular;  Laterality: N/A;   CARDIOVERSION N/A 04/29/2016   Procedure: CARDIOVERSION;  Surgeon: Jerel Balding, MD;  Location: [facility] ENDOSCOPY;  Service: Cardiovascular;  Laterality: N/A;   CATARACT EXTRACTION W/PHACO Left 07/20/2021   Procedure: CATARACT EXTRACTION PHACO AND INTRAOCULAR LENS PLACEMENT (IOC);  Surgeon: Harrie Agent, MD;  Location: [facility] ORS;  Service: Ophthalmology;  Laterality: Left;  CDE: 10.07   CATARACT EXTRACTION W/PHACO Right 08/03/2021   Procedure: CATARACT EXTRACTION PHACO AND INTRAOCULAR LENS PLACEMENT (IOC);  Surgeon: Harrie Agent, MD;  Location: [facility] ORS;  Service: Ophthalmology;  Laterality: Right;  CDE 7.93   COLONOSCOPY WITH PROPOFOL  N/A 09/20/2022   Procedure: COLONOSCOPY WITH PROPOFOL ;  Surgeon: Eartha Angelia Sieving, MD;  Location: [facility] ENDO SUITE;  Service: Gastroenterology;  Laterality: N/A;  9:30am;asa 3   DG  BONE DENSITY ([facility] HX)     INNER EAR SURGERY Left    ear drum replacement X2   NASAL SEPTUM SURGERY     POLYPECTOMY  09/20/2022   Procedure: POLYPECTOMY;  Surgeon: Eartha Angelia, Sieving, MD;  Location: [facility] ENDO SUITE;  Service: Gastroenterology;;  cold snare, cold biopsy    Social History   Socioeconomic History   Marital status: Married    Spouse name: Not on file   Number of children: Not on file   Years of education: Not on file   Highest education level: Not on file  Occupational History   Not on file  Tobacco Use   Smoking status: Never    Passive exposure: Past   Smokeless tobacco: Never  Vaping Use   Vaping status: Never Used  Substance and Sexual Activity   Alcohol use: No   Drug use: No   Sexual activity: Yes  Other  Topics Concern   Not on file  Social History Narrative   Retired Engineer, Civil (consulting).  Lives in [place].   Social Drivers of Health   Tobacco Use: Low Risk (03/20/2024)   Patient History    Smoking Tobacco Use: Never    Smokeless Tobacco Use: Never    Passive Exposure: Past  Financial Resource Strain: Low Risk (03/16/2023)   Overall Financial Resource Strain (CARDIA)    Difficulty of Paying Living Expenses: Not very hard  Food Insecurity: No Food Insecurity (01/09/2024)   Epic    Worried About Radiation Protection Practitioner of Food in the Last Year: Never true    Ran Out of Food in the Last Year: Never true  Transportation Needs: No Transportation Needs (06/08/2023)   PRAPARE - Transportation  Lack of Transportation (Medical): No    Lack of Transportation (Non-Medical): No  Physical Activity: Inactive (07/05/2023)   Exercise Vital Sign    Days of Exercise per Week: 0 days    Minutes of Exercise per Session: 0 min  Stress: Not on file  Social Connections: Not on file  Intimate Partner Violence: Not At Risk (07/05/2023)   Humiliation, Afraid, Rape, and Kick questionnaire    Fear of Current or Ex-Partner: No    Emotionally Abused: No    Physically Abused: No    Sexually Abused: No  Depression (PHQ2-9): Low Risk (08/04/2023)   Depression (PHQ2-9)    PHQ-2 Score: 0  Alcohol Screen: Not on file  Housing: Low Risk (01/09/2024)   Epic    Unable to Pay for Housing in the Last Year: No    Number of Times Moved in the Last Year: 0    Homeless in the Last Year: No  Utilities: Not At Risk (01/09/2024)   Epic    Threatened with loss of utilities: No  Health Literacy: Adequate Health Literacy (01/09/2024)   B1300 Health Literacy    Frequency of need for help with medical instructions: Never     Allergies[1]   Outpatient Medications Prior to Visit  Medication Sig Dispense Refill   ACCU-CHEK GUIDE TEST test strip 1 each by Other route daily at 6 (six) AM.     Accu-Chek Softclix Lancets lancets daily at 6 (six) AM.      acetaminophen  (TYLENOL ) 500 MG tablet Take 500-1,000 mg by mouth every 8 (eight) hours as needed for moderate pain.     apixaban  (ELIQUIS ) 5 MG TABS tablet Take 5 mg by mouth 2 (two) times daily.     Blood Glucose Monitoring Suppl (ACCU-CHEK GUIDE) w/Device KIT daily at 6 (six) AM.     Calcium Carbonate-Vit D-Min (CALTRATE PLUS PO) Take 1 capsule by mouth in the morning, at noon, and at bedtime.     cetirizine (ZYRTEC) 10 MG tablet Take 10 mg by mouth daily.     cholecalciferol (VITAMIN D3) 25 MCG (1000 UNIT) tablet Take 1,000 Units by mouth daily.     clobetasol ointment (TEMOVATE) 0.05 % Apply topically daily.     dofetilide  (TIKOSYN ) 125 MCG capsule TAKE THREE CAPSULES BY MOUTH TWICE DAILY (LEAVE in stock bottles) 540 capsule 3   EMBECTA PEN NEEDLE NANO 2 GEN 32G X 4 MM MISC as directed.     furosemide  (LASIX ) 40 MG tablet TAKE 1 TABLET BY MOUTH EVERY OTHER DAY, AS NEEDED FOR 2  DOSES  FOR  SWELLING 90 tablet 3   glimepiride (AMARYL) 2 MG tablet Take 4 mg by mouth in the morning and at bedtime.     guaiFENesin (MUCINEX) 600 MG 12 hr tablet Take 600 mg by mouth daily.     HYDROcodone-acetaminophen  (NORCO/VICODIN) 5-325 MG tablet Take 1 tablet by mouth every 6 (six) hours as needed for moderate pain.     insulin glargine (LANTUS) 100 UNIT/ML injection Inject 25 Units into the skin 2 (two) times daily.     L-Lysine 500 MG TABS Take 500 mg by mouth in the morning, at noon, and at bedtime.     LANTUS SOLOSTAR 100 UNIT/ML Solostar Pen SMARTSIG:25 Unit(s) SUB-Q Twice Daily     levalbuterol (XOPENEX) 0.63 MG/3ML nebulizer solution Take 0.63 mg by nebulization every 6 (six) hours as needed for wheezing or shortness of breath.     losartan (COZAAR) 100 MG tablet Take 100 mg by  mouth daily.     magnesium  oxide (MAG-OX) 400 MG tablet TAKE 1 TABLET BY MOUTH TWICE A DAY 180 tablet 3   metoprolol  succinate (TOPROL  XL) 25 MG 24 hr tablet Take 0.5 tablets (12.5 mg total) by mouth daily. 45 tablet 3    multivitamin-lutein  (OCUVITE-LUTEIN ) CAPS capsule Take 1 capsule by mouth daily.     Omega-3 Fatty Acids (FISH OIL ) 1000 MG CAPS Take 1,000 mg by mouth 3 (three) times daily.      triamcinolone  cream (KENALOG ) 0.1 % Apply 1 application  topically daily as needed (rash).     valACYclovir (VALTREX) 1000 MG tablet Take 2,000 mg by mouth 2 (two) times daily.     Fluticasone-Salmeterol (ADVAIR) 100-50 MCG/DOSE AEPB Inhale 1 puff into the lungs daily as needed (shortness of breath).     No facility-administered medications prior to visit.    ROS   Objective:  Physical Exam Vitals reviewed.  Constitutional:      Appearance: Normal appearance. She is obese.  HENT:     Head: Normocephalic and atraumatic.     Mouth/Throat:     Mouth: Mucous membranes are moist.     Pharynx: Oropharynx is clear. No oropharyngeal exudate or posterior oropharyngeal erythema.     Comments: MP III, no tongue scalloping Eyes:     General: No scleral icterus.       Right eye: No discharge.        Left eye: No discharge.     Conjunctiva/sclera: Conjunctivae normal.  Cardiovascular:     Rate and Rhythm: Normal rate and regular rhythm.     Heart sounds: Normal heart sounds. No murmur heard.    No friction rub. No gallop.  Pulmonary:     Effort: Pulmonary effort is normal.     Breath sounds: Normal breath sounds. No wheezing, rhonchi or rales.  Neurological:     Mental Status: She is alert and oriented to person, place, and time. Mental status is at baseline.  Psychiatric:        Behavior: Behavior normal.        Thought Content: Thought content normal.        Judgment: Judgment normal.      Vitals:   03/20/24 1458  BP: 128/62  Pulse: 67  Temp: 98.6 F (37 C)  TempSrc: Oral  SpO2: 96%  Weight: 271 lb 12.8 oz (123.3 kg)  Height: 5' 6 (1.676 m)   96% on RA BMI Readings from Last 3 Encounters:  03/20/24 43.87 kg/m  01/19/24 41.57 kg/m  11/01/23 41.05 kg/m   Wt Readings from Last 3  Encounters:  03/20/24 271 lb 12.8 oz (123.3 kg)  01/19/24 273 lb 6.4 oz (124 kg)  11/01/23 270 lb (122.5 kg)     CBC    Component Value Date/Time   WBC 10.4 11/01/2023 1502   WBC 9.3 01/05/2016 1247   RBC 4.38 11/01/2023 1502   RBC 4.55 01/05/2016 1247   HGB 12.9 11/01/2023 1502   HCT 39.2 11/01/2023 1502   PLT 223 11/01/2023 1502   MCV 90 11/01/2023 1502   MCH 29.5 11/01/2023 1502   MCH 30.1 01/05/2016 1247   MCHC 32.9 11/01/2023 1502   MCHC 33.6 01/05/2016 1247   RDW 12.7 11/01/2023 1502   LYMPHSABS 5.1 (H) 02/22/2017 1626   MONOABS 372 01/05/2016 1247   EOSABS 0.1 02/22/2017 1626   BASOSABS 0.0 02/22/2017 1626    Chest Imaging: CT Chest (01/27/2024): 1. No evidence of fibrotic interstitial lung  disease. Bland, bandlike scarring of the lingula and left lung base. 2. Lobular air trapping on expiratory phase imaging, consistent with small airways disease. 3. Background of fine centrilobular nodularity most concentrated in the lung apices, most commonly seen in smoking-related respiratory bronchiolitis. 4. Occasional small pulmonary nodules measuring 0.3 cm and smaller. No follow-up needed if patient is low-risk (and has no known or suspected primary neoplasm). Non-contrast chest CT can be considered in 12 months if patient is high-risk. This recommendation follows the consensus statement: Guidelines for Management of Incidental Pulmonary Nodules Detected on CT Images: From the Fleischner Society 2017; Radiology 2017; 284:228-243. 5. Mild cardiomegaly. Coronary artery disease.  Pulmonary Functions Testing Results:     No data to display          FeNO: N/A  Pathology: N/A  Echocardiogram: TTE (03/02/2024): 1. Poor acoustic windows limit study.   2. Vigorous LV function with near cavity obliteration with systole. No  significant obstruction ot outflow at rest or with Valsalva. Left  ventricular ejection fraction, by estimation, is >75%. The left  ventricle  has hyperdynamic function. The left  ventricle has no regional wall motion abnormalities. There is mild  concentric left ventricular hypertrophy. Left ventricular diastolic  parameters are consistent with Grade I diastolic dysfunction (impaired  relaxation).   3. Right ventricular systolic function is normal. The right ventricular  size is normal.   4. Left atrial size was moderately dilated.   5. Trivial mitral valve regurgitation.   6. The aortic valve is tricuspid. Aortic valve regurgitation is not  visualized. Aortic valve sclerosis/calcification is present, without any  evidence of aortic stenosis.   7. The inferior vena cava is normal in size with <50% respiratory  variability, suggesting right atrial pressure of 8 mmHg.   Heart Catheterization: N/A  Sleep Studies:  PSG (02/15/2016): Severe OSA with AHI of 31.2/hr. REM AHI was 58.3/hr, supine AHI 46.1/hr, and O2 desaturations to 69%.  CPAP Titration (05/05/2016): CPAP 10-18 cm H2O was recommended.    Assessment & Plan:     ICD-10-CM   1. OSA (obstructive sleep apnea)  G47.33 Pulse oximetry, overnight    2. Sleep-wake schedule disorder, delayed phase type  G47.21     3. Shortness of breath  R06.02 Pulmonary Function Test    4. Class 3 severe obesity due to excess calories with serious comorbidity and body mass index (BMI) of 40.0 to 44.9 in adult [facility])  Z33.186    Z68.41       Discussion:  Severe OSA: - Continue use of CPAP 10-16 cm H2O with full face mask. - Congratulated patient on adherence to therapy (adherence is 100% of nights with average duration of use of 6.5 hrs per night). - DME: [employer]. - While her residual AHI is low (as reported by device) we will perform overnight oximetry on CPAP to exclude any occult events / nocturnal hypoxemia, especially since she is still experiencing EDS symptoms. - The patient strongly prefers to avoid the sleep lab if at all possible.  Delayed Sleep Wake  Phase Disorder: - A melatonin dose of 1 mg is preferable but doses of 3 mg (or even 5 mg) are acceptable if unable to find a lower dose. - Start taking melatonin at 8 pm and go to bed approximately 1 hour earlier than you are at present (~midnight to 1 am). If you feel sleepy earlier than this it is ok to go to bed when you feel sleepy. Once reliably sleeping at this  time, advance both timing of melatonin and bedtime by an additional 30-60 minutes, repeating as needed. - Consistency with respect to the timing of melatonin is essential, as is tight adherence to a sleep schedule. A single night of non-adherence to melatonin and/or your sleep schedule is sufficient to reverse the effects of weeks or months of efforts to advance your sleep phase.  Dyspnea (NOS): - Obtain PFTs to help exclude a diagnosis of asthma, especially given her reported sensitivity to cigarette smoke and other environmental triggers. Office to call the patient with results as indicated, and arrange earlier follow up if needed. - We did discuss that her obesity is likely playing a role in her dyspnea, but agreed that excluding a component of asthma / bronchial hyperreactivity is appropriate.  Class III Obesity (BMI 43.9): - We discussed the role that the patient's weight is likely playing with respect to the severity of her OSA, dyspnea, and even the knee pain she reports she is experiencing. - I had hoped to be able to recommend tirzepatide for her, however she has previously tried semaglutide and had intolerable GI side effects. She does not wish to try another agent of the same class at the present time.  The above conditions were evaluated and recommendations made in the context of several associated co-morbidities including hypertension and atrial fibrillation (s/p cardioversion and maintained on A/C and antiarrhythmic therapy).   Current Medications[2]   Follow up: 1 year   Time spent on day of this encounter (includes  time spent face-to-face with the patient as well as time spent the same day as the encounter reviewing existing data and notes, and/or documenting my findings and the plan of care): 63 minutes   Lamar Dales, MD Pulmonary, Critical Care & Sleep Medicine [facility] 03/20/2024 4:19 PM     [1]  Allergies Allergen Reactions   Penicillins Hives    severe  Other Reaction(s): Other (See Comments)  severe, welps all over, welps all over   Other     Cigarette smoke and certain perfumes - chest tightening and runny nose   Neosporin [Neomycin-Bacitracin Zn-Polymyx] Rash  [2]  Current Outpatient Medications:    ACCU-CHEK GUIDE TEST test strip, 1 each by Other route daily at 6 (six) AM., Disp: , Rfl:    Accu-Chek Softclix Lancets lancets, daily at 6 (six) AM., Disp: , Rfl:    acetaminophen  (TYLENOL ) 500 MG tablet, Take 500-1,000 mg by mouth every 8 (eight) hours as needed for moderate pain., Disp: , Rfl:    apixaban  (ELIQUIS ) 5 MG TABS tablet, Take 5 mg by mouth 2 (two) times daily., Disp: , Rfl:    Blood Glucose Monitoring Suppl (ACCU-CHEK GUIDE) w/Device KIT, daily at 6 (six) AM., Disp: , Rfl:    Calcium Carbonate-Vit D-Min (CALTRATE PLUS PO), Take 1 capsule by mouth in the morning, at noon, and at bedtime., Disp: , Rfl:    cetirizine (ZYRTEC) 10 MG tablet, Take 10 mg by mouth daily., Disp: , Rfl:    cholecalciferol (VITAMIN D3) 25 MCG (1000 UNIT) tablet, Take 1,000 Units by mouth daily., Disp: , Rfl:    clobetasol ointment (TEMOVATE) 0.05 %, Apply topically daily., Disp: , Rfl:    dofetilide  (TIKOSYN ) 125 MCG capsule, TAKE THREE CAPSULES BY MOUTH TWICE DAILY (LEAVE in stock bottles), Disp: 540 capsule, Rfl: 3   EMBECTA PEN NEEDLE NANO 2 GEN 32G X 4 MM MISC, as directed., Disp: , Rfl:    furosemide  (LASIX ) 40 MG tablet, TAKE 1  TABLET BY MOUTH EVERY OTHER DAY, AS NEEDED FOR 2  DOSES  FOR  SWELLING, Disp: 90 tablet, Rfl: 3   glimepiride (AMARYL) 2 MG tablet, Take 4 mg by mouth in  the morning and at bedtime., Disp: , Rfl:    guaiFENesin (MUCINEX) 600 MG 12 hr tablet, Take 600 mg by mouth daily., Disp: , Rfl:    HYDROcodone-acetaminophen  (NORCO/VICODIN) 5-325 MG tablet, Take 1 tablet by mouth every 6 (six) hours as needed for moderate pain., Disp: , Rfl:    insulin glargine (LANTUS) 100 UNIT/ML injection, Inject 25 Units into the skin 2 (two) times daily., Disp: , Rfl:    L-Lysine 500 MG TABS, Take 500 mg by mouth in the morning, at noon, and at bedtime., Disp: , Rfl:    LANTUS SOLOSTAR 100 UNIT/ML Solostar Pen, SMARTSIG:25 Unit(s) SUB-Q Twice Daily, Disp: , Rfl:    levalbuterol (XOPENEX) 0.63 MG/3ML nebulizer solution, Take 0.63 mg by nebulization every 6 (six) hours as needed for wheezing or shortness of breath., Disp: , Rfl:    losartan (COZAAR) 100 MG tablet, Take 100 mg by mouth daily., Disp: , Rfl:    magnesium  oxide (MAG-OX) 400 MG tablet, TAKE 1 TABLET BY MOUTH TWICE A DAY, Disp: 180 tablet, Rfl: 3   metoprolol  succinate (TOPROL  XL) 25 MG 24 hr tablet, Take 0.5 tablets (12.5 mg total) by mouth daily., Disp: 45 tablet, Rfl: 3   multivitamin-lutein  (OCUVITE-LUTEIN ) CAPS capsule, Take 1 capsule by mouth daily., Disp: , Rfl:    Omega-3 Fatty Acids (FISH OIL ) 1000 MG CAPS, Take 1,000 mg by mouth 3 (three) times daily. , Disp: , Rfl:    triamcinolone  cream (KENALOG ) 0.1 %, Apply 1 application  topically daily as needed (rash)., Disp: , Rfl:    valACYclovir (VALTREX) 1000 MG tablet, Take 2,000 mg by mouth 2 (two) times daily., Disp: , Rfl:   "

## 2024-03-20 NOTE — Patient Instructions (Addendum)
" °  Please schedule your Pulmonary Function Test (PFTs) when you check out today. My office will contact you with the results. If any sooner follow up is needed we will arrange for it at that time.  ----------   I am going to enquire with [employer] [facility] about performing overnight oximetry while you use CPAP to help ensure your CPAP settings are appropriate.  ----------  Please schedule a follow up appointment with me for 1 year when you check out today.  ----------   "

## 2024-04-04 ENCOUNTER — Encounter: Payer: Self-pay | Admitting: *Deleted

## 2024-04-04 ENCOUNTER — Other Ambulatory Visit: Payer: Self-pay | Admitting: *Deleted

## 2024-04-04 NOTE — Patient Outreach (Signed)
 Complex Care Management   Visit Note  04/04/2024  Name:  Dominique Bauer MRN: [id number] DOB: 1945-04-16  Situation: Referral received for Complex Care Management related to Atrial Fibrillation and pulmonary conditions. I obtained verbal consent from Patient.  Visit completed with Patient  on the phone  Background:   Past Medical History:  Diagnosis Date   Arthritis    osteo arthritis   Body mass index 40.0-44.9, adult (HCC)    Diabetes (HCC)    Dysrhythmia    H/O measles    H/O mumps    H/O: whooping cough    History of chicken pox    HLD (hyperlipidemia)    Hypertension    Persistent atrial fibrillation (HCC)    PONV (postoperative nausea and vomiting)    Sciatica    Sleep apnea    not using aything at night right now/LH    Assessment: Patient Reported Symptoms:  Cognitive Cognitive Status: Alert and oriented to person, place, and time, Normal speech and language skills, No symptoms reported Cognitive/Intellectual Conditions Management [RPT]: None reported or documented in medical history or problem list   Health Maintenance Behaviors: Annual physical exam Healing Pattern: Unsure Health Facilitated by: Rest  Neurological Neurological Review of Symptoms: No symptoms reported    HEENT HEENT Symptoms Reported: No symptoms reported      Cardiovascular Cardiovascular Symptoms Reported: No symptoms reported Does patient have uncontrolled Hypertension?: No Cardiovascular Comment: Reviewed and discussed echocardiogram results and provider interpretation. Has an appointment with a new cardiology electrophysiologist on 05/19/23. Verified transportation.  Respiratory Respiratory Symptoms Reported: Shortness of breath    Endocrine Endocrine Symptoms Reported: Not assessed    Gastrointestinal Gastrointestinal Symptoms Reported: Abdominal pain or discomfort Additional Gastrointestinal Details: upper abdominal discomfort for 6 days. Some nausea initially and one episode of  vomitting. Several days of loose stools but normal bowel movement yesterday and today. Increased flatulence. Denies reflux/heartburn, frank blood in stool, or black/tarry stools. Slight increase in discomfort after eating.Does not take PPIs, H2 blockers, or antacids. Encouraged to F/U with PCP with new, worsening, or persistent symptoms. Gastrointestinal Management Strategies: Diet modification Gastrointestinal Self-Management Outcome: 4 (good)    Genitourinary Genitourinary Symptoms Reported: No symptoms reported Additional Genitourinary Details: CKD Stage 2 Genitourinary Management Strategies: Fluid modification, Medication therapy Genitourinary Comment: Patient had questions/concerns about CKD Stage 2 diagnosis. Reviewed and discussed most recent BUN, Creatinine, eGFR. Discussed basic pathyphysiology. Encouraged to maintain hydration status. Avoid NSAIDs. Should be avoiding anyway because of Eliquis  use. Encouraged routine labs and follow-up with PCP. Educated that proper blood pressure and blood sugar management will benefit kidney function. Taking losartan as prescribed.  Integumentary Integumentary Symptoms Reported: Not assessed    Musculoskeletal Musculoskelatal Symptoms Reviewed: Joint pain Other Musculoskeletal Symptoms: continues to have right knee pain with no known injury. Additional Musculoskeletal Details: Using walker for ambulation. Managing with tylenol  and hydrocodone. Periodically takes Aspirin 325 mg. Advised against this due to increased bleeding risk when taken with Eliquis . Arthroscopic right knee surgery with Dr Melodi 23 years ago. Musculoskeletal Comment: Has an appointment scheduled with Dr Melodi for tomorrow, 04/05/24. Verified transportation.      Psychosocial Psychosocial Symptoms Reported: No symptoms reported          04/04/2024    PHQ2-9 Depression Screening   Little interest or pleasure in doing things    Feeling down, depressed, or hopeless    PHQ-2 -  Total Score    Trouble falling or staying asleep, or sleeping too much  Feeling tired or having little energy    Poor appetite or overeating     Feeling bad about yourself - or that you are a failure or have let yourself or your family down    Trouble concentrating on things, such as reading the newspaper or watching television    Moving or speaking so slowly that other people could have noticed.  Or the opposite - being so fidgety or restless that you have been moving around a lot more than usual    Thoughts that you would be better off dead, or hurting yourself in some way    PHQ2-9 Total Score    If you checked off any problems, how difficult have these problems made it for you to do your work, take care of things at home, or get along with other people    Depression Interventions/Treatment      There were no vitals filed for this visit. Pain Scale: 0-10 Pain Score: 8  Pain Type: Chronic pain Pain Location: Knee Pain Orientation: Right Pain Descriptors / Indicators: Aching Pain Onset: On-going Patients Stated Pain Goal: 3 Pain Intervention(s): Medication (See eMAR), Other (Comment) (appointment with orthopedic surgeon, Dr Melodi, scheduled for 04/05/24)  Medications Reviewed Today     Reviewed by Charlsie Josette SAILOR, RN (Registered Nurse) on 04/04/24 at 1612  Med List Status: <None>   Medication Order Taking? Sig Documenting Provider Last Dose Status Informant  ACCU-CHEK GUIDE TEST test strip [id number] Yes 1 each by Other route daily at 6 (six) AM. [provider]  Active   Accu-Chek Softclix Lancets lancets [id number] Yes daily at 6 (six) AM. [provider]  Active   acetaminophen  (TYLENOL ) 500 MG tablet [id number] Yes Take 500-1,000 mg by mouth every 8 (eight) hours as needed for moderate pain. [provider]  Active Self  apixaban  (ELIQUIS ) 5 MG TABS tablet [id number] Yes Take 5 mg by mouth 2 (two) times daily. [provider]  Active Self  Blood  Glucose Monitoring Suppl (ACCU-CHEK GUIDE) w/Device KIT [id number] Yes daily at 6 (six) AM. [provider]  Active   Calcium Carbonate-Vit D-Min (CALTRATE PLUS PO) [id number] Yes Take 1 capsule by mouth in the morning, at noon, and at bedtime. [provider]  Active Self  cetirizine (ZYRTEC) 10 MG tablet [id number] Yes Take 10 mg by mouth daily. [provider]  Active Self           Med Note (ROCHELLE, REBECCA G   Tue Jun 21, 2023  2:18 PM) PRN  cholecalciferol (VITAMIN D3) 25 MCG (1000 UNIT) tablet [id number] Yes Take 1,000 Units by mouth daily. [provider]  Active Self  clobetasol ointment (TEMOVATE) 0.05 % [id number] Yes Apply topically daily. [provider]  Active   dofetilide  (TIKOSYN ) 125 MCG capsule [id number] Yes TAKE THREE CAPSULES BY MOUTH TWICE DAILY (LEAVE in stock bottles) Leverne Charlies Helling, PA-C  Active   EMBECTA PEN NEEDLE NANO 2 GEN 32G X 4 MM MISC [id number] Yes as directed. [provider]  Active   furosemide  (LASIX ) 40 MG tablet [id number] Yes TAKE 1 TABLET BY MOUTH EVERY OTHER DAY, AS NEEDED FOR 2  DOSES  FOR  SWELLING Ursuy, Renee Lynn, PA-C  Active   glimepiride (AMARYL) 2 MG tablet [id number] Yes Take 4 mg by mouth in the morning and at bedtime. [provider]  Active Self  guaiFENesin (MUCINEX) 600 MG 12 hr tablet [id number] Yes Take 600 mg by mouth daily. [provider]  Active Self           Med Note (ROCHELLE, REBECCA G   Tue Jun 21, 2023  2:17 PM) PRN  HYDROcodone-acetaminophen  (NORCO/VICODIN) 5-325 MG tablet [id number] Yes Take 1 tablet by mouth every 6 (six) hours as needed for moderate pain. [provider]  Active Self           Med Note RONELLE POSEY R   Tue Mar 20, 2024  2:57 PM) Patient takes 2 times daily as needed  insulin glargine (LANTUS) 100 UNIT/ML injection [id number] Yes Inject 25 Units into the skin 2 (two) times daily. [provider]  Active   L-Lysine 500 MG TABS  [id number] Yes Take 500 mg by mouth in the morning, at noon, and at bedtime. [provider]  Active Self  LANTUS SOLOSTAR 100 UNIT/ML Solostar Pen [id number] Yes SMARTSIG:25 Unit(s) SUB-Q Twice Daily [provider]  Active   levalbuterol (XOPENEX) 0.63 MG/3ML nebulizer solution [id number] Yes Take 0.63 mg by nebulization every 6 (six) hours as needed for wheezing or shortness of breath. [provider]  Active Self  losartan (COZAAR) 100 MG tablet [id number] Yes Take 100 mg by mouth daily. [provider]  Active Self  magnesium  oxide (MAG-OX) 400 MG tablet [id number] Yes TAKE 1 TABLET BY MOUTH TWICE A DAY Burnard Debby LABOR, MD  Active Self  metoprolol  succinate (TOPROL  XL) 25 MG 24 hr tablet [id number] Yes Take 0.5 tablets (12.5 mg total) by mouth daily. Burnard Debby LABOR, MD  Active   multivitamin-lutein  (OCUVITE-LUTEIN ) CAPS capsule [id number] Yes Take 1 capsule by mouth daily. [provider]  Active Self  Omega-3 Fatty Acids (FISH OIL ) 1000 MG CAPS [id number] Yes Take 1,000 mg by mouth 3 (three) times daily.  [provider]  Active Self  triamcinolone  cream (KENALOG ) 0.1 % [id number] Yes Apply 1 application  topically daily as needed (rash). [provider]  Active Self  valACYclovir (VALTREX) 1000 MG tablet [id number] Yes Take 2,000 mg by mouth 2 (two) times daily. [provider]  Active             Recommendation:   Specialty provider follow-up for Pulmonary Function Tests and cardiology appointment Continue Current Plan of Care  Follow Up Plan:   Telephone follow up appointment date/time:  04/14/23 at 2:00  Josette Pellet, RN, BSN [place]  [facility] RN Care Manager Direct Dial: [phone]  Fax: [phone]

## 2024-04-04 NOTE — Patient Instructions (Signed)
 Visit Information  Thank you for taking time to visit with me today. Please don't hesitate to contact me if I can be of assistance to you before our next scheduled appointment.  Your next care management appointment is by telephone on 05/14/24 at 2:00   Please call the care guide team at [phone] if you need to cancel, schedule, or reschedule an appointment.   Please call the [facility]: [phone] call [phone] if you are experiencing a Mental Health or Behavioral Health Crisis or need someone to talk to.  Josette Pellet, RN, BSN [place]  [facility] RN Care Manager Direct Dial: [phone]  Fax: [phone]

## 2024-04-13 ENCOUNTER — Telehealth: Payer: Self-pay

## 2024-04-13 ENCOUNTER — Telehealth: Payer: Self-pay | Admitting: *Deleted

## 2024-04-13 NOTE — Progress Notes (Signed)
 Complex Care Management Care Guide Note  04/13/2024 Name: Dominique Bauer MRN: [id number] DOB: 06-04-1945  Dominique Bauer is a 79 y.o. year old female who is a primary care patient of Hasanaj, Yancey LABOR, MD and is actively engaged with the care management team. I reached out to Avelina Claudene Lunger by phone today to assist with re-scheduling  with the RN Case Manager.  Follow up plan: Telephone appointment with complex care management team member scheduled for:  04/16/24 @ 1:30 pm  Leotis Rase [facility], [facility] Guide  Direct Dial: [phone]  Fax [phone]

## 2024-04-16 ENCOUNTER — Encounter: Payer: Self-pay | Admitting: *Deleted

## 2024-04-16 ENCOUNTER — Telehealth: Payer: Self-pay | Admitting: *Deleted

## 2024-05-02 ENCOUNTER — Encounter

## 2024-05-07 ENCOUNTER — Telehealth: Admitting: *Deleted

## 2024-05-08 ENCOUNTER — Telehealth

## 2024-05-18 ENCOUNTER — Ambulatory Visit: Admitting: Student in an Organized Health Care Education/Training Program

## 2024-06-26 ENCOUNTER — Encounter

## 2024-06-26 ENCOUNTER — Ambulatory Visit: Admitting: Pulmonary Disease
